# Patient Record
Sex: Male | Born: 1975 | Race: Black or African American | Hispanic: No | Marital: Married | Smoking: Never smoker
Health system: Southern US, Community
[De-identification: ages and names within clinical notes are randomized; demographics above are authoritative.]

## PROBLEM LIST (undated history)

## (undated) DIAGNOSIS — Z8674 Personal history of sudden cardiac arrest: Secondary | ICD-10-CM

## (undated) DIAGNOSIS — I219 Acute myocardial infarction, unspecified: Secondary | ICD-10-CM

## (undated) DIAGNOSIS — I10 Essential (primary) hypertension: Secondary | ICD-10-CM

## (undated) DIAGNOSIS — I2510 Atherosclerotic heart disease of native coronary artery without angina pectoris: Secondary | ICD-10-CM

---

## 2017-07-30 ENCOUNTER — Inpatient Hospital Stay (HOSPITAL_COMMUNITY)
Admission: EM | Admit: 2017-07-30 | Discharge: 2017-08-06 | DRG: 246 | Disposition: A | Discharge status: Home / Self Care | Payer: BLUE CROSS/BLUE SHIELD | Attending: Cardiology | Admitting: Cardiology

## 2017-07-30 ENCOUNTER — Encounter (HOSPITAL_COMMUNITY): Payer: Self-pay | Admitting: Emergency Medicine

## 2017-07-30 ENCOUNTER — Encounter (HOSPITAL_COMMUNITY)
Admission: EM | Disposition: A | Discharge status: Home / Self Care | Payer: Self-pay | Source: Home / Self Care | Attending: Cardiology

## 2017-07-30 DIAGNOSIS — I255 Ischemic cardiomyopathy: Secondary | ICD-10-CM | POA: Diagnosis present

## 2017-07-30 DIAGNOSIS — I252 Old myocardial infarction: Secondary | ICD-10-CM

## 2017-07-30 DIAGNOSIS — I2510 Atherosclerotic heart disease of native coronary artery without angina pectoris: Secondary | ICD-10-CM | POA: Diagnosis present

## 2017-07-30 DIAGNOSIS — I4901 Ventricular fibrillation: Secondary | ICD-10-CM | POA: Diagnosis present

## 2017-07-30 DIAGNOSIS — I5043 Acute on chronic combined systolic (congestive) and diastolic (congestive) heart failure: Secondary | ICD-10-CM | POA: Diagnosis not present

## 2017-07-30 DIAGNOSIS — Z452 Encounter for adjustment and management of vascular access device: Secondary | ICD-10-CM

## 2017-07-30 DIAGNOSIS — R402212 Coma scale, best verbal response, none, at arrival to emergency department: Secondary | ICD-10-CM | POA: Diagnosis present

## 2017-07-30 DIAGNOSIS — J690 Pneumonitis due to inhalation of food and vomit: Secondary | ICD-10-CM | POA: Diagnosis not present

## 2017-07-30 DIAGNOSIS — R739 Hyperglycemia, unspecified: Secondary | ICD-10-CM | POA: Diagnosis present

## 2017-07-30 DIAGNOSIS — R451 Restlessness and agitation: Secondary | ICD-10-CM | POA: Diagnosis not present

## 2017-07-30 DIAGNOSIS — Z7902 Long term (current) use of antithrombotics/antiplatelets: Secondary | ICD-10-CM

## 2017-07-30 DIAGNOSIS — Z79899 Other long term (current) drug therapy: Secondary | ICD-10-CM

## 2017-07-30 DIAGNOSIS — Z955 Presence of coronary angioplasty implant and graft: Secondary | ICD-10-CM | POA: Diagnosis not present

## 2017-07-30 DIAGNOSIS — Z8674 Personal history of sudden cardiac arrest: Secondary | ICD-10-CM

## 2017-07-30 DIAGNOSIS — I5021 Acute systolic (congestive) heart failure: Secondary | ICD-10-CM | POA: Diagnosis present

## 2017-07-30 DIAGNOSIS — R570 Cardiogenic shock: Secondary | ICD-10-CM

## 2017-07-30 DIAGNOSIS — R402312 Coma scale, best motor response, none, at arrival to emergency department: Secondary | ICD-10-CM | POA: Diagnosis present

## 2017-07-30 DIAGNOSIS — E785 Hyperlipidemia, unspecified: Secondary | ICD-10-CM | POA: Diagnosis present

## 2017-07-30 DIAGNOSIS — R402112 Coma scale, eyes open, never, at arrival to emergency department: Secondary | ICD-10-CM | POA: Diagnosis present

## 2017-07-30 DIAGNOSIS — I10 Essential (primary) hypertension: Secondary | ICD-10-CM

## 2017-07-30 DIAGNOSIS — Z7982 Long term (current) use of aspirin: Secondary | ICD-10-CM

## 2017-07-30 DIAGNOSIS — N170 Acute kidney failure with tubular necrosis: Secondary | ICD-10-CM | POA: Diagnosis present

## 2017-07-30 DIAGNOSIS — I110 Hypertensive heart disease with heart failure: Secondary | ICD-10-CM | POA: Diagnosis present

## 2017-07-30 DIAGNOSIS — I469 Cardiac arrest, cause unspecified: Secondary | ICD-10-CM

## 2017-07-30 DIAGNOSIS — E876 Hypokalemia: Secondary | ICD-10-CM | POA: Diagnosis not present

## 2017-07-30 DIAGNOSIS — G931 Anoxic brain damage, not elsewhere classified: Secondary | ICD-10-CM

## 2017-07-30 DIAGNOSIS — N179 Acute kidney failure, unspecified: Secondary | ICD-10-CM

## 2017-07-30 DIAGNOSIS — I2119 ST elevation (STEMI) myocardial infarction involving other coronary artery of inferior wall: Secondary | ICD-10-CM | POA: Diagnosis present

## 2017-07-30 DIAGNOSIS — I4891 Unspecified atrial fibrillation: Secondary | ICD-10-CM | POA: Diagnosis present

## 2017-07-30 DIAGNOSIS — J9601 Acute respiratory failure with hypoxia: Secondary | ICD-10-CM | POA: Diagnosis present

## 2017-07-30 DIAGNOSIS — I472 Ventricular tachycardia: Secondary | ICD-10-CM | POA: Diagnosis present

## 2017-07-30 DIAGNOSIS — R319 Hematuria, unspecified: Secondary | ICD-10-CM | POA: Diagnosis not present

## 2017-07-30 DIAGNOSIS — G9341 Metabolic encephalopathy: Secondary | ICD-10-CM | POA: Diagnosis present

## 2017-07-30 DIAGNOSIS — E872 Acidosis: Secondary | ICD-10-CM | POA: Diagnosis present

## 2017-07-30 DIAGNOSIS — J40 Bronchitis, not specified as acute or chronic: Secondary | ICD-10-CM | POA: Diagnosis not present

## 2017-07-30 DIAGNOSIS — I213 ST elevation (STEMI) myocardial infarction of unspecified site: Secondary | ICD-10-CM

## 2017-07-30 DIAGNOSIS — Z781 Physical restraint status: Secondary | ICD-10-CM

## 2017-07-30 DIAGNOSIS — E875 Hyperkalemia: Secondary | ICD-10-CM | POA: Diagnosis not present

## 2017-07-30 DIAGNOSIS — F419 Anxiety disorder, unspecified: Secondary | ICD-10-CM | POA: Diagnosis present

## 2017-07-30 DIAGNOSIS — J9690 Respiratory failure, unspecified, unspecified whether with hypoxia or hypercapnia: Secondary | ICD-10-CM

## 2017-07-30 HISTORY — PX: LEFT HEART CATH AND CORONARY ANGIOGRAPHY: CATH118249

## 2017-07-30 HISTORY — PX: CORONARY/GRAFT ACUTE MI REVASCULARIZATION: CATH118305

## 2017-07-30 HISTORY — DX: Acute myocardial infarction, unspecified: I21.9

## 2017-07-30 HISTORY — DX: Personal history of sudden cardiac arrest: Z86.74

## 2017-07-30 LAB — COMPREHENSIVE METABOLIC PANEL
ALBUMIN: 3.8 g/dL (ref 3.5–5.0)
ALK PHOS: 78 U/L (ref 38–126)
ALT: 618 U/L — ABNORMAL HIGH (ref 17–63)
ANION GAP: 23 — AB (ref 5–15)
AST: 564 U/L — ABNORMAL HIGH (ref 15–41)
BILIRUBIN TOTAL: 0.6 mg/dL (ref 0.3–1.2)
BUN: 11 mg/dL (ref 6–20)
CALCIUM: 8.3 mg/dL — AB (ref 8.9–10.3)
CO2: 11 mmol/L — ABNORMAL LOW (ref 22–32)
Chloride: 105 mmol/L (ref 101–111)
Creatinine, Ser: 1.57 mg/dL — ABNORMAL HIGH (ref 0.61–1.24)
GFR, EST NON AFRICAN AMERICAN: 53 mL/min — AB (ref >60)
GLUCOSE: 180 mg/dL — AB (ref 65–99)
POTASSIUM: 3.7 mmol/L (ref 3.5–5.1)
Sodium: 139 mmol/L (ref 135–145)
TOTAL PROTEIN: 6.8 g/dL (ref 6.5–8.1)

## 2017-07-30 LAB — I-STAT CHEM 8, ED
BUN: 13 mg/dL (ref 6–20)
CALCIUM ION: 1.03 mmol/L — AB (ref 1.15–1.40)
Chloride: 108 mmol/L (ref 101–111)
Creatinine, Ser: 1.5 mg/dL — ABNORMAL HIGH (ref 0.61–1.24)
GLUCOSE: 177 mg/dL — AB (ref 65–99)
HCT: 48 % (ref 39.0–52.0)
Hemoglobin: 16.3 g/dL (ref 13.0–17.0)
Potassium: 3.5 mmol/L (ref 3.5–5.1)
SODIUM: 140 mmol/L (ref 135–145)
TCO2: 14 mmol/L — ABNORMAL LOW (ref 22–32)

## 2017-07-30 LAB — CBC
HEMATOCRIT: 45.5 % (ref 39.0–52.0)
HEMOGLOBIN: 15.3 g/dL (ref 13.0–17.0)
MCH: 31.6 pg (ref 26.0–34.0)
MCHC: 33.6 g/dL (ref 30.0–36.0)
MCV: 94 fL (ref 78.0–100.0)
Platelets: 236 10*3/uL (ref 150–400)
RBC: 4.84 MIL/uL (ref 4.22–5.81)
RDW: 13.6 % (ref 11.5–15.5)
WBC: 11.3 10*3/uL — ABNORMAL HIGH (ref 4.0–10.5)

## 2017-07-30 LAB — HEMOGLOBIN A1C
HEMOGLOBIN A1C: 5.6 % (ref 4.8–5.6)
Mean Plasma Glucose: 114.02 mg/dL

## 2017-07-30 LAB — LIPID PANEL
Cholesterol: 133 mg/dL (ref 0–200)
HDL: 56 mg/dL (ref >40)
LDL Cholesterol: 29 mg/dL (ref 0–99)
Total CHOL/HDL Ratio: 2.4 RATIO
Triglycerides: 239 mg/dL — ABNORMAL HIGH (ref <150)
VLDL: 48 mg/dL — ABNORMAL HIGH (ref 0–40)

## 2017-07-30 LAB — PROTIME-INR
INR: 1.17
PROTHROMBIN TIME: 14.8 s (ref 11.4–15.2)

## 2017-07-30 LAB — PHOSPHORUS: PHOSPHORUS: 7.5 mg/dL — AB (ref 2.5–4.6)

## 2017-07-30 LAB — TRIGLYCERIDES: TRIGLYCERIDES: 232 mg/dL — AB (ref <150)

## 2017-07-30 LAB — I-STAT TROPONIN, ED: TROPONIN I, POC: 11.86 ng/mL — AB (ref 0.00–0.08)

## 2017-07-30 LAB — TROPONIN I: Troponin I: 11.07 ng/mL (ref <0.03)

## 2017-07-30 LAB — MAGNESIUM: MAGNESIUM: 2 mg/dL (ref 1.7–2.4)

## 2017-07-30 LAB — APTT: aPTT: 33 seconds (ref 24–36)

## 2017-07-30 SURGERY — CORONARY/GRAFT ACUTE MI REVASCULARIZATION
Anesthesia: LOCAL

## 2017-07-30 MED ORDER — CANGRELOR TETRASODIUM 50 MG IV SOLR
INTRAVENOUS | Status: AC
  Filled 2017-07-30: qty 50

## 2017-07-30 MED ORDER — CHLORHEXIDINE GLUCONATE 0.12% ORAL RINSE (MEDLINE KIT)
15.0000 mL | Freq: Two times a day (BID) | OROMUCOSAL | Status: DC
  Administered 2017-07-31 – 2017-08-03 (×8): 15 mL via OROMUCOSAL

## 2017-07-30 MED ORDER — SODIUM CHLORIDE 0.9 % IV SOLN: INTRAVENOUS | Status: DC

## 2017-07-30 MED ORDER — FENTANYL BOLUS VIA INFUSION
50.0000 ug | INTRAVENOUS | Status: DC | PRN
  Administered 2017-08-01: 50 ug via INTRAVENOUS
  Filled 2017-07-30: qty 50

## 2017-07-30 MED ORDER — LIDOCAINE HCL (PF) 1 % IJ SOLN
INTRAMUSCULAR | Status: DC | PRN
  Administered 2017-07-30: 18 mL via SUBCUTANEOUS

## 2017-07-30 MED ORDER — MIDAZOLAM HCL 2 MG/2ML IJ SOLN
INTRAMUSCULAR | Status: DC | PRN
  Administered 2017-07-30 (×2): 2 mg via INTRAVENOUS
  Administered 2017-07-30 (×2): 1 mg via INTRAVENOUS

## 2017-07-30 MED ORDER — NITROGLYCERIN 1 MG/10 ML FOR IR/CATH LAB
INTRA_ARTERIAL | Status: DC | PRN
  Administered 2017-07-30 (×2): 150 ug via INTRACORONARY

## 2017-07-30 MED ORDER — MIDAZOLAM HCL 2 MG/2ML IJ SOLN
INTRAMUSCULAR | Status: AC
  Filled 2017-07-30: qty 2

## 2017-07-30 MED ORDER — FENTANYL CITRATE (PF) 100 MCG/2ML IJ SOLN
INTRAMUSCULAR | Status: AC
  Filled 2017-07-30: qty 2

## 2017-07-30 MED ORDER — FAMOTIDINE IN NACL 20-0.9 MG/50ML-% IV SOLN
20.0000 mg | Freq: Two times a day (BID) | INTRAVENOUS | Status: DC
  Administered 2017-07-31 – 2017-08-01 (×2): 20 mg via INTRAVENOUS
  Filled 2017-07-30 (×2): qty 50

## 2017-07-30 MED ORDER — LIDOCAINE HCL (PF) 1 % IJ SOLN
INTRAMUSCULAR | Status: AC
  Filled 2017-07-30: qty 30

## 2017-07-30 MED ORDER — PROPOFOL 1000 MG/100ML IV EMUL
5.0000 ug/kg/min | INTRAVENOUS | Status: DC
  Filled 2017-07-30: qty 100

## 2017-07-30 MED ORDER — EPINEPHRINE PF 1 MG/ML IJ SOLN
0.5000 ug/min | INTRAMUSCULAR | Status: DC
  Filled 2017-07-30: qty 4

## 2017-07-30 MED ORDER — CISATRACURIUM BOLUS VIA INFUSION
0.0500 mg/kg | INTRAVENOUS | Status: DC | PRN
  Filled 2017-07-30: qty 6

## 2017-07-30 MED ORDER — ETOMIDATE 2 MG/ML IV SOLN
INTRAVENOUS | Status: AC | PRN
  Administered 2017-07-30: 10 mg via INTRAVENOUS

## 2017-07-30 MED ORDER — MIDAZOLAM HCL 2 MG/2ML IJ SOLN
2.0000 mg | Freq: Once | INTRAMUSCULAR | Status: AC
  Administered 2017-07-31: 2 mg via INTRAVENOUS

## 2017-07-30 MED ORDER — BIVALIRUDIN BOLUS VIA INFUSION - CUPID
INTRAVENOUS | Status: DC | PRN
  Administered 2017-07-30: 76.575 mg via INTRAVENOUS

## 2017-07-30 MED ORDER — CANGRELOR BOLUS VIA INFUSION
INTRAVENOUS | Status: DC | PRN
  Administered 2017-07-30: 3063 ug via INTRAVENOUS

## 2017-07-30 MED ORDER — BIVALIRUDIN TRIFLUOROACETATE 250 MG IV SOLR
INTRAVENOUS | Status: AC
  Filled 2017-07-30: qty 250

## 2017-07-30 MED ORDER — SODIUM CHLORIDE 0.9 % IV SOLN
INTRAVENOUS | Status: DC | PRN
  Administered 2017-07-30 (×2): 4 ug/kg/min via INTRAVENOUS

## 2017-07-30 MED ORDER — SODIUM BICARBONATE 8.4 % IV SOLN
INTRAVENOUS | Status: DC | PRN
  Administered 2017-07-30 (×2): 50 meq via INTRAVENOUS

## 2017-07-30 MED ORDER — SODIUM BICARBONATE 8.4 % IV SOLN
INTRAVENOUS | Status: AC
  Administered 2017-07-31: 100 meq via INTRAVENOUS
  Filled 2017-07-30: qty 100

## 2017-07-30 MED ORDER — NITROGLYCERIN 1 MG/10 ML FOR IR/CATH LAB
INTRA_ARTERIAL | Status: AC
  Filled 2017-07-30: qty 10

## 2017-07-30 MED ORDER — SODIUM CHLORIDE 0.9 % IV SOLN
1.0000 ug/kg/min | INTRAVENOUS | Status: DC
  Administered 2017-07-31: 1 ug/kg/min via INTRAVENOUS
  Administered 2017-07-31: 1.5 ug/kg/min via INTRAVENOUS
  Filled 2017-07-30 (×2): qty 20

## 2017-07-30 MED ORDER — SENNOSIDES 8.8 MG/5ML PO SYRP
5.0000 mL | ORAL_SOLUTION | Freq: Every day | ORAL | Status: DC
  Administered 2017-07-31 – 2017-08-01 (×2): 5 mL
  Filled 2017-07-30 (×2): qty 5

## 2017-07-30 MED ORDER — DEXTROSE 5 % IV SOLN
INTRAVENOUS | Status: AC | PRN
  Administered 2017-07-30: 150 mg via INTRAVENOUS

## 2017-07-30 MED ORDER — DOCUSATE SODIUM 50 MG/5ML PO LIQD
100.0000 mg | Freq: Two times a day (BID) | ORAL | Status: DC
  Administered 2017-07-31 – 2017-08-01 (×4): 100 mg
  Filled 2017-07-30 (×4): qty 10

## 2017-07-30 MED ORDER — INSULIN ASPART 100 UNIT/ML ~~LOC~~ SOLN
1.0000 [IU] | SUBCUTANEOUS | Status: DC
  Administered 2017-07-31: 3 [IU] via SUBCUTANEOUS
  Administered 2017-07-31 (×2): 2 [IU] via SUBCUTANEOUS

## 2017-07-30 MED ORDER — AMIODARONE HCL IN DEXTROSE 360-4.14 MG/200ML-% IV SOLN
INTRAVENOUS | Status: AC | PRN
  Administered 2017-07-30 (×2): 60 mg/h via INTRAVENOUS

## 2017-07-30 MED ORDER — SODIUM BICARBONATE 8.4 % IV SOLN
100.0000 meq | Freq: Once | INTRAVENOUS | Status: AC
  Administered 2017-07-31: 100 meq via INTRAVENOUS

## 2017-07-30 MED ORDER — TICAGRELOR 90 MG PO TABS
ORAL_TABLET | ORAL | Status: DC | PRN
  Administered 2017-07-30: 180 mg via ORAL

## 2017-07-30 MED ORDER — HEPARIN (PORCINE) IN NACL 2-0.9 UNITS/ML
INTRAMUSCULAR | Status: AC | PRN
  Administered 2017-07-30 (×3): 500 mL

## 2017-07-30 MED ORDER — NOREPINEPHRINE 4 MG/250ML-% IV SOLN
INTRAVENOUS | Status: AC
Start: 2017-07-30 — End: 2017-07-30
  Filled 2017-07-30: qty 250

## 2017-07-30 MED ORDER — FENTANYL CITRATE (PF) 100 MCG/2ML IJ SOLN
INTRAMUSCULAR | Status: DC | PRN
  Administered 2017-07-30: 50 ug via INTRAVENOUS

## 2017-07-30 MED ORDER — ARTIFICIAL TEARS OPHTHALMIC OINT
1.0000 "application " | TOPICAL_OINTMENT | Freq: Three times a day (TID) | OPHTHALMIC | Status: DC
  Administered 2017-07-31 – 2017-08-01 (×5): 1 via OPHTHALMIC
  Filled 2017-07-30: qty 3.5

## 2017-07-30 MED ORDER — IOHEXOL 350 MG/ML SOLN
INTRAVENOUS | Status: DC | PRN
  Administered 2017-07-30: 80 mL via INTRAVENOUS

## 2017-07-30 MED ORDER — ROCURONIUM BROMIDE 50 MG/5ML IV SOLN
INTRAVENOUS | Status: AC | PRN
  Administered 2017-07-30: 90 mg via INTRAVENOUS

## 2017-07-30 MED ORDER — SODIUM CHLORIDE 0.9 % IV SOLN
INTRAVENOUS | Status: AC | PRN
  Administered 2017-07-30: 102 mL via INTRAVENOUS

## 2017-07-30 MED ORDER — ORAL CARE MOUTH RINSE
15.0000 mL | Freq: Four times a day (QID) | OROMUCOSAL | Status: DC
  Administered 2017-07-31 (×2): 15 mL via OROMUCOSAL

## 2017-07-30 MED ORDER — VERAPAMIL HCL 2.5 MG/ML IV SOLN
INTRAVENOUS | Status: DC | PRN
  Administered 2017-07-30: 400 ug via INTRACORONARY
  Administered 2017-07-30: 200 ug via INTRACORONARY

## 2017-07-30 MED ORDER — FENTANYL 2500MCG IN NS 250ML (10MCG/ML) PREMIX INFUSION
100.0000 ug/h | INTRAVENOUS | Status: DC
  Administered 2017-07-31: 250 ug/h via INTRAVENOUS
  Administered 2017-07-31: 100 ug/h via INTRAVENOUS
  Administered 2017-07-31: 200 ug/h via INTRAVENOUS
  Administered 2017-08-01 (×2): 300 ug/h via INTRAVENOUS
  Filled 2017-07-30 (×5): qty 250

## 2017-07-30 MED ORDER — SODIUM CHLORIDE 0.9 % IV SOLN
INTRAVENOUS | Status: AC | PRN
  Administered 2017-07-30: 1000 mL via INTRAVENOUS

## 2017-07-30 MED ORDER — POTASSIUM CHLORIDE 10 MEQ/100ML IV SOLN
10.0000 meq | INTRAVENOUS | Status: AC
  Administered 2017-07-31 (×4): 10 meq via INTRAVENOUS
  Filled 2017-07-30 (×4): qty 100

## 2017-07-30 MED ORDER — ASPIRIN 300 MG RE SUPP
300.0000 mg | RECTAL | Status: AC
  Administered 2017-07-30: 300 mg via RECTAL
  Filled 2017-07-30: qty 1

## 2017-07-30 MED ORDER — SODIUM CHLORIDE 0.9 % IV SOLN
2.0000 mg/h | INTRAVENOUS | Status: DC
  Administered 2017-07-31 (×2): 2 mg/h via INTRAVENOUS
  Administered 2017-07-31 – 2017-08-01 (×2): 6 mg/h via INTRAVENOUS
  Filled 2017-07-30 (×5): qty 10

## 2017-07-30 MED ORDER — NOREPINEPHRINE BITARTRATE 1 MG/ML IV SOLN
0.0000 ug/min | INTRAVENOUS | Status: DC
  Administered 2017-07-30: 20 ug/min via INTRAVENOUS
  Administered 2017-08-01: 10 ug/min via INTRAVENOUS
  Administered 2017-08-02: 40 ug/min via INTRAVENOUS
  Filled 2017-07-30 (×6): qty 16

## 2017-07-30 MED ORDER — NOREPINEPHRINE BITARTRATE 1 MG/ML IV SOLN
INTRAVENOUS | Status: DC | PRN
  Administered 2017-07-30: 10 ug/min via INTRAVENOUS
  Administered 2017-07-30

## 2017-07-30 MED ORDER — IOPAMIDOL (ISOVUE-370) INJECTION 76%
INTRAVENOUS | Status: DC | PRN
  Administered 2017-07-30: 115 mL via INTRAVENOUS

## 2017-07-30 MED ORDER — CISATRACURIUM BOLUS VIA INFUSION
0.1000 mg/kg | Freq: Once | INTRAVENOUS | Status: AC
  Administered 2017-07-31: 10.2 mg via INTRAVENOUS
  Filled 2017-07-30: qty 11

## 2017-07-30 MED ORDER — MIDAZOLAM BOLUS VIA INFUSION
2.0000 mg | INTRAVENOUS | Status: DC | PRN
  Filled 2017-07-30: qty 2

## 2017-07-30 MED ORDER — ALBUTEROL SULFATE (2.5 MG/3ML) 0.083% IN NEBU: 2.5000 mg | INHALATION_SOLUTION | RESPIRATORY_TRACT | Status: DC | PRN

## 2017-07-30 MED ORDER — FENTANYL CITRATE (PF) 100 MCG/2ML IJ SOLN
100.0000 ug | Freq: Once | INTRAMUSCULAR | Status: AC
  Administered 2017-07-31: 100 ug via INTRAVENOUS

## 2017-07-30 MED ORDER — HEPARIN (PORCINE) IN NACL 1000-0.9 UT/500ML-% IV SOLN
INTRAVENOUS | Status: AC
  Filled 2017-07-30: qty 1500

## 2017-07-30 MED ORDER — SODIUM CHLORIDE 0.9 % IV SOLN
INTRAVENOUS | Status: DC | PRN
  Administered 2017-07-30 (×3): 1.75 mg/kg/h via INTRAVENOUS

## 2017-07-30 SURGICAL SUPPLY — 30 items
BALLN EMERGE MR 2.5X12 (BALLOONS) ×2
BALLN EMERGE MR 3.5X20 (BALLOONS) ×2
BALLN EMERGE MR PUSH 1.5X15 (BALLOONS) ×2
BALLN ~~LOC~~ EMERGE MR 2.5X12 (BALLOONS) ×2
BALLN ~~LOC~~ EMERGE MR 2.5X20 (BALLOONS) ×2
BALLN ~~LOC~~ EMERGE MR 3.0X20 (BALLOONS) ×2
BALLN ~~LOC~~ EMERGE MR 4.0X20 (BALLOONS) ×2
BALLN ~~LOC~~ EMERGE MR 4.5X12 (BALLOONS) ×2
BALLOON EMERGE MR 2.5X12 (BALLOONS) ×1 IMPLANT
BALLOON EMERGE MR 3.5X20 (BALLOONS) ×1 IMPLANT
BALLOON EMERGE MR PUSH 1.5X15 (BALLOONS) ×1 IMPLANT
BALLOON ~~LOC~~ EMERGE MR 2.5X12 (BALLOONS) ×1 IMPLANT
BALLOON ~~LOC~~ EMERGE MR 2.5X20 (BALLOONS) ×1 IMPLANT
BALLOON ~~LOC~~ EMERGE MR 3.0X20 (BALLOONS) ×1 IMPLANT
BALLOON ~~LOC~~ EMERGE MR 4.0X20 (BALLOONS) ×1 IMPLANT
BALLOON ~~LOC~~ EMERGE MR 4.5X12 (BALLOONS) ×1 IMPLANT
CATH EXTRAC PRONTO 5.5F 138CM (CATHETERS) ×2 IMPLANT
CATH INFINITI 5FR MULTPACK ANG (CATHETERS) ×2 IMPLANT
CATH LAUNCHER 6FR JR4 (CATHETERS) ×2 IMPLANT
KIT ENCORE 26 ADVANTAGE (KITS) ×2 IMPLANT
KIT HEART LEFT (KITS) ×2 IMPLANT
PACK CARDIAC CATHETERIZATION (CUSTOM PROCEDURE TRAY) ×2 IMPLANT
SHEATH PINNACLE 6F 10CM (SHEATH) ×2 IMPLANT
STENT SIERRA 3.50 X 38 MM (Permanent Stent) ×2 IMPLANT
STENT SIERRA 4.00 X 28 MM (Permanent Stent) ×2 IMPLANT
TRANSDUCER W/STOPCOCK (MISCELLANEOUS) ×2 IMPLANT
TUBING CIL FLEX 10 FLL-RA (TUBING) ×2 IMPLANT
WIRE EMERALD 3MM-J .035X150CM (WIRE) ×2 IMPLANT
WIRE PT2 LS 185 (WIRE) ×4 IMPLANT
WIRE RUNTHROUGH .014X180CM (WIRE) ×2 IMPLANT

## 2017-07-30 NOTE — ED Provider Notes (Signed)
[place] EMERGENCY DEPARTMENT Provider Note   CSN: [id number] Arrival date & time: 07/30/17  1928     History   Chief Complaint Chief Complaint  Patient presents with  . Code STEMI    HPI Lee Moore is a 42 y.o. male.  HPI  42 year old male with a history of coronary artery disease, with history of 5 stent placement, presented with concern for V. fib arrest.  Patient had witnessed arrest with bystander CPR, and on fire arrival AED had recommended shock, and patient was shocked twice with the fire department, and on EMS arrival, was still in ventricular fibrillation, received 2 further defibrillations with return of spontaneous circulation.  His blood pressures had been 120s, and 90s just prior to arrival.  His estimated downtime was approximately 15 minutes.  Patient had return of spontaneous circulation prior to receiving other medications or EMS.  On arrival to the emergency department, patient is with sinus tachycardia, with BVM assisting ventilations.  EMS had previously placed a King airway, but patient had been biting down on the tube, and the tube was removed.  He has not opened eyes, made sound, or had any movements to pain.   Past Medical History:  Diagnosis Date  . MI (myocardial infarction) [facility])     Patient Active Problem List   Diagnosis Date Noted  . Cardiac arrest ([place]) 07/30/2017    History reviewed. No pertinent surgical history.      Home Medications    Prior to Admission medications   Not on File    Family History No family history on file.  Social History Social History   Tobacco Use  . Smoking status: Unknown If Ever Smoked  Substance Use Topics  . Alcohol use: Not Currently    Comment: unknown  . Drug use: Not on file     Allergies   Patient has no allergy information on record.   Review of Systems Review of Systems  Unable to perform ROS: Patient unresponsive     Physical Exam Updated Vital Signs BP  (!) 79/41   Pulse 75   Resp (!) 23   Ht 6' (1.829 m)   Wt 102.1 kg (225 lb)   SpO2 100%   BMI 30.52 kg/m   Physical Exam  Constitutional: He appears well-developed and well-nourished. He has a sickly appearance. He appears ill.  HENT:  Head: Normocephalic and atraumatic.  Eyes: Pupils are equal, round, and reactive to light. Conjunctivae are normal.  Neck: Normal range of motion.  Cardiovascular: Regular rhythm, normal heart sounds and intact distal pulses. Tachycardia present. Exam reveals no gallop and no friction rub.  No murmur heard. Pulmonary/Chest: No respiratory distress. He has no wheezes.  Spontaneous breaths assisted by BVM Rhonchi bilaterally  Abdominal: Soft. He exhibits no distension. There is no tenderness. There is no guarding.  Musculoskeletal: He exhibits no edema.  Neurological: He is unresponsive. GCS eye subscore is 1. GCS verbal subscore is 1. GCS motor subscore is 1.  Skin: Skin is warm and dry. He is not diaphoretic.  Nursing note and vitals reviewed.    ED Treatments / Results  Labs (all labs ordered are listed, but only abnormal results are displayed) Labs Reviewed  COMPREHENSIVE METABOLIC PANEL - Abnormal; Notable for the following components:      Result Value   CO2 11 (*)    Glucose, Bld 180 (*)    Creatinine, Ser 1.57 (*)    Calcium 8.3 (*)    AST  564 (*)    ALT 618 (*)    GFR calc non Af Amer 53 (*)    Anion gap 23 (*)    All other components within normal limits  LIPID PANEL - Abnormal; Notable for the following components:   Triglycerides 239 (*)    VLDL 48 (*)    All other components within normal limits  CBC - Abnormal; Notable for the following components:   WBC 11.3 (*)    All other components within normal limits  TROPONIN I - Abnormal; Notable for the following components:   Troponin I 11.07 (*)    All other components within normal limits  PHOSPHORUS - Abnormal; Notable for the following components:   Phosphorus 7.5 (*)     All other components within normal limits  PROTIME-INR - Abnormal; Notable for the following components:   Prothrombin Time 24.5 (*)    All other components within normal limits  APTT - Abnormal; Notable for the following components:   aPTT 69 (*)    All other components within normal limits  LACTIC ACID, PLASMA - Abnormal; Notable for the following components:   Lactic Acid, Venous 3.5 (*)    All other components within normal limits  TRIGLYCERIDES - Abnormal; Notable for the following components:   Triglycerides 232 (*)    All other components within normal limits  GLUCOSE, CAPILLARY - Abnormal; Notable for the following components:   Glucose-Capillary 224 (*)    All other components within normal limits  I-STAT CHEM 8, ED - Abnormal; Notable for the following components:   Creatinine, Ser 1.50 (*)    Glucose, Bld 177 (*)    Calcium, Ion 1.03 (*)    TCO2 14 (*)    All other components within normal limits  I-STAT TROPONIN, ED - Abnormal; Notable for the following components:   Troponin i, poc 11.86 (*)    All other components within normal limits  MRSA PCR SCREENING  CULTURE, RESPIRATORY (NON-EXPECTORATED)  HEMOGLOBIN A1C  PROTIME-INR  APTT  MAGNESIUM  CBC  BLOOD GAS, ARTERIAL  HIV ANTIBODY (ROUTINE TESTING)  TROPONIN I  TROPONIN I  TROPONIN I  BASIC METABOLIC PANEL  BASIC METABOLIC PANEL  BASIC METABOLIC PANEL  BASIC METABOLIC PANEL  BASIC METABOLIC PANEL  BASIC METABOLIC PANEL  BASIC METABOLIC PANEL  PROTIME-INR  APTT  BLOOD GAS, ARTERIAL  BLOOD GAS, ARTERIAL  BLOOD GAS, ARTERIAL  MAGNESIUM  PHOSPHORUS  PROCALCITONIN  HEPATIC FUNCTION PANEL  TROPONIN I  BASIC METABOLIC PANEL  BASIC METABOLIC PANEL  BASIC METABOLIC PANEL  BASIC METABOLIC PANEL  BASIC METABOLIC PANEL  BASIC METABOLIC PANEL  URINALYSIS, ROUTINE W REFLEX MICROSCOPIC  CORTISOL    EKG EKG Interpretation  Date/Time:  Saturday Jul 30 2017 19:32:44 EDT Ventricular Rate:  140 PR  Interval:    QRS Duration: 111 QT Interval:  307 QTC Calculation: 469 R Axis:   -90 Text Interpretation:  Sinus tachycardia with irregular rate LVH with secondary repolarization abnormality Inferior infarct, acute (RCA) Anterior infarct, old Probable RV involvement, suggest recording right precordial leads No previous ECGs available Confirmed by Gareth Morgan [phone]) on 07/31/2017 1:28:45 AM   Radiology Dg Chest Port 1 View  Result Date: 07/31/2017 CLINICAL DATA:  42 y/o  M; endotracheal tube, central line, OG tube. EXAM: PORTABLE CHEST 1 VIEW COMPARISON:  None. FINDINGS: Endotracheal tube tip 2 cm above the carina. Enteric tube tip below field of view and abdomen. Right central venous catheter tip projects over right cavoatrial junction. Transcutaneous  pacing pads noted. Hazy opacities in the lungs. No pleural effusion or pneumothorax. Bones are unremarkable. IMPRESSION: Hazy opacities of the lungs probably represents vascular congestion. Endotracheal tube, enteric tube, and right central venous catheter in satisfactory position. Electronically Signed   By: Kristine Garbe M.D.   On: 07/31/2017 00:33    Procedures .Critical Care Performed by: Gareth Morgan, MD Authorized by: Gareth Morgan, MD   Critical care provider statement:    Critical care time (minutes):  30   Critical care was necessary to treat or prevent imminent or life-threatening deterioration of the following conditions:  Cardiac failure   Critical care was time spent personally by me on the following activities:  Discussions with consultants, re-evaluation of patient's condition, ordering and review of laboratory studies, pulse oximetry and ordering and performing treatments and interventions Procedure Name: Intubation Date/Time: 07/31/2017 1:40 AM Performed by: Gareth Morgan, MD Pre-anesthesia Checklist: Patient identified, Patient being monitored, Emergency Drugs available, Timeout performed and Suction  available Oxygen Delivery Method: Ambu bag Preoxygenation: Pre-oxygenation with 100% oxygen Induction Type: Rapid sequence Ventilation: Mask ventilation without difficulty Laryngoscope Size: Glidescope Grade View: Grade I Tube size: 7.0 mm Number of attempts: 1 Placement Confirmation: ETT inserted through vocal cords under direct vision,  CO2 detector and Breath sounds checked- equal and bilateral      (including critical care time)  Medications Ordered in ED Medications  propofol (DIPRIVAN) 1000 MG/100ML infusion (20 mcg/kg/min  102.1 kg Intravenous Rate/Dose Change 07/30/17 2245)  norepinephrine (LEVOPHED) 16 mg in dextrose 5 % 250 mL (0.064 mg/mL) infusion (40 mcg/min Intravenous Rate/Dose Change 07/30/17 2340)  cisatracurium (NIMBEX) bolus via infusion 10.2 mg (10.2 mg Intravenous Bolus from Bag 07/31/17 0014)    And  cisatracurium (NIMBEX) 200 mg in sodium chloride 0.9 % 200 mL (1 mg/mL) infusion (1 mcg/kg/min  102.1 kg Intravenous New Bag/Given 07/31/17 0014)    And  cisatracurium (NIMBEX) bolus via infusion 5.1 mg (has no administration in time range)  artificial tears (LACRILUBE) ophthalmic ointment 1 application (has no administration in time range)  0.9 %  sodium chloride infusion (has no administration in time range)  fentaNYL 252mg in NS 254m(1059mml) infusion-PREMIX (200 mcg/hr Intravenous New Bag/Given 07/31/17 0014)  fentaNYL (SUBLIMAZE) bolus via infusion 50 mcg (has no administration in time range)  midazolam (VERSED) 50 mg in sodium chloride 0.9 % 50 mL (1 mg/mL) infusion (2 mg/hr Intravenous New Bag/Given 07/31/17 0014)  midazolam (VERSED) bolus via infusion 2 mg (has no administration in time range)  famotidine (PEPCID) IVPB 20 mg premix (has no administration in time range)  chlorhexidine gluconate (MEDLINE KIT) (PERIDEX) 0.12 % solution 15 mL (15 mLs Mouth Rinse Given 07/31/17 0111)  MEDLINE mouth rinse (has no administration in time range)  albuterol (PROVENTIL) (2.5  MG/3ML) 0.083% nebulizer solution 2.5 mg (has no administration in time range)  insulin aspart (novoLOG) injection 1-3 Units (has no administration in time range)  docusate (COLACE) 50 MG/5ML liquid 100 mg (has no administration in time range)  sennosides (SENOKOT) 8.8 MG/5ML syrup 5 mL (has no administration in time range)  potassium chloride 10 mEq in 100 mL IVPB (10 mEq Intravenous New Bag/Given 07/31/17 0045)  EPINEPHrine (ADRENALIN) 4 mg in dextrose 5 % 250 mL (0.016 mg/mL) infusion (has no administration in time range)  0.9 %  sodium chloride infusion (1,000 mLs Intravenous New Bag/Given 07/30/17 1934)  etomidate (AMIDATE) injection (10 mg Intravenous Given 07/30/17 1935)  rocuronium (ZEMURON) injection (90 mg Intravenous Given 07/30/17 1935)  amiodarone (CORDARONE) 150 mg in dextrose 5 % 100 mL bolus (150 mg Intravenous New Bag/Given 07/30/17 1940)  amiodarone (NEXTERONE PREMIX) 360-4.14 MG/200ML-% (1.8 mg/mL) IV infusion (60 mg/hr Intravenous New Bag/Given 07/30/17 2223)  0.9 %  sodium chloride infusion (  Rate/Dose Change 07/30/17 2250)  heparin infusion 2 units/mL in 0.9 % sodium chloride (500 mLs Other New Bag/Given 07/30/17 2258)  aspirin suppository 300 mg (300 mg Rectal Given 07/30/17 2338)  fentaNYL (SUBLIMAZE) injection 100 mcg (100 mcg Intravenous Given 07/31/17 0014)  midazolam (VERSED) injection 2 mg (2 mg Intravenous Given 07/31/17 0014)  sodium bicarbonate injection 100 mEq (100 mEq Intravenous Given 07/31/17 0003)     Initial Impression / Assessment and Plan / ED Course  I have reviewed the triage vital signs and the nursing notes.  Pertinent labs & imaging results that were available during my care of the patient were reviewed by me and considered in my medical decision making (see chart for details).     42 year old male with a history of coronary artery disease, with history of 5 stent placement, presented with concern for V. fib arrest.  Patient had witnessed arrest with bystander CPR,  and on fire arrival AED had recommended shock, and patient was shocked twice with the fire department, and on EMS arrival, was still in ventricular fibrillation, received 2 further defibrillations with return of spontaneous circulation.  King airway was both placed and removed by EMS when patient began to bite on tube.  EMS called Code STEMI.   On arrival to the ED, Cardiology at bedside. Immediately placed patient on monitor and intubated for airway protection. GCS 3.  Airway confirmed with bilateral breath sounds, end tidal CO2 and direct visualization.  Do not feel XR given presentation at this time and critical condition is indicated as patient developed cardiac arrhythmias again and feel resuscitation and immediate reperfusion most important. As we completed intubation and were preparing for the cath lab, he went into pulseless VT.  He was shocked and compressions initiated.  He then went between pulseless VT, receiving amiodarone 338m, epi 143m and approximately 4 defibrillations with moments of VT with pulse and beats of sinus prior to return of VT. Patient with ROSC, sinus rhythm, and he was brought to the cath lab in critical condition with Cardiology. Updated family regarding his critical condition. Patient also received heparin, cooling initiated with ice packs.   Final Clinical Impressions(s) / ED Diagnoses   Final diagnoses:  Cardiac arrest with ventricular fibrillation ([place] elevation myocardial infarction (STEMI), [facility]   ED Discharge Orders        Ordered    AMB Referral to Cardiac Rehabilitation - Phase II    Comments:  V. fib cardiac arrest   07/30/17 2328       ScGareth MorganMD 07/31/17 [id number]

## 2017-07-30 NOTE — ED Notes (Signed)
To Cath lab 

## 2017-07-30 NOTE — ED Triage Notes (Addendum)
Brought by ems after having a witnessed arrest.  Shocked X 4.  Versed 2.5 given via ems.  King airway in place on arrival.  Found in v fib then showed STEMI.  Medications documented under one step except heparin 4000 units given IV at 1934.  Cooling started at 1933.  King airway removed at 1935 reintubated at this time.  In V tach at 1937 rate between 150-210.  Shocked at 200 j at 1930, 1939, had positive pulses but continued to be tachy at 160-210.  Shocked again at 200j at 28.  Given Epi 1 at 1941 and shocked again 200j.  Positive Right carotid pulse continues.  Shocked again 1943 still positive pulses with monitor showing vtach 237.  Shocked again 1945 on the way to cath lab.  Continued to have pulses and tachy at 180.  OG tube placed on arrival to cath lab. 16 fr.  Positive placement with auscultation.

## 2017-07-30 NOTE — H&P (Signed)
Lee Moore is an 42 y.o. male.   Chief Complaint: Code STEMI  HPI:  patient is 42 year old male with past medical history significant for coronary artery disease history of inferior wall myocardial infarction in 2017, hypertension, hyperlipidemia, had witnessed cardiac arrest at his friend's house after his son's baseball game. Patient suddenly collapsed EMS was called and was noted to be in V. fib requiring multiple defibrillations with conversion to sinus rhythm patient was intubated on the field and was brought to the ED return of spontaneous circulation  achieved in an and he approximately 10-15 minutes as per his wife. patient subsequently had multiple episodes of sustained VT in the ER requiring multiple defibrillations with conversion to sinus rhythm patient was emergently brought to the Cath Lab for emergency PCI EKG done on the field showed A. fib with RVR with Q waves in inferior leads with ST elevation and reciprocal ST depression in high lateral leads. As per his wife patient had vague chest discomfort a week ago did not seek any medical attention also ran out of aspirin a week ago has been on chronic Brilinta and aspirin since his MI. Patient recently moved from [facility] had 5 stents in distal RCA and PDA and PLV branches as per his wife.   Past Medical History:  Diagnosis Date  . MI (myocardial infarction) ([place])     History reviewed. No pertinent surgical history.  No family history on file. Social History:  reports that he drank alcohol. His tobacco and drug histories are not on file.  Allergies: Not on File   (Not in a hospital admission)  Results for orders placed or performed during the hospital encounter of 07/30/17 (from the past 48 hour(s))  Comprehensive metabolic panel     Status: Abnormal   Collection Time: 07/30/17  7:35 PM  Result Value Ref Range   Sodium 139 135 - 145 mmol/L   Potassium 3.7 3.5 - 5.1 mmol/L   Chloride 105 101 - 111 mmol/L   CO2 11 (L) 22 - 32  mmol/L   Glucose, Bld 180 (H) 65 - 99 mg/dL   BUN 11 6 - 20 mg/dL   Creatinine, Ser 1.57 (H) 0.61 - 1.24 mg/dL   Calcium 8.3 (L) 8.9 - 10.3 mg/dL   Total Protein 6.8 6.5 - 8.1 g/dL   Albumin 3.8 3.5 - 5.0 g/dL   AST 564 (H) 15 - 41 U/L   ALT 618 (H) 17 - 63 U/L   Alkaline Phosphatase 78 38 - 126 U/L   Total Bilirubin 0.6 0.3 - 1.2 mg/dL   GFR calc non Af Amer 53 (L) >60 mL/min   GFR calc Af Amer >60 >60 mL/min    Comment: (NOTE) The eGFR has been calculated using the CKD EPI equation. This calculation has not been validated in all clinical situations. eGFR's persistently <60 mL/min signify possible Chronic Kidney Disease.    Anion gap 23 (H) 5 - 15    Comment: Performed at [facility], [place] [address], [place] [address]  CBC     Status: Abnormal   Collection Time: 07/30/17  7:35 PM  Result Value Ref Range   WBC 11.3 (H) 4.0 - 10.5 K/uL   RBC 4.84 4.22 - 5.81 MIL/uL   Hemoglobin 15.3 13.0 - 17.0 g/dL   HCT 45.5 39.0 - 52.0 %   MCV 94.0 78.0 - 100.0 fL   MCH 31.6 26.0 - 34.0 pg   MCHC 33.6 30.0 - 36.0 g/dL  RDW 13.6 11.5 - 15.5 %   Platelets 236 150 - 400 K/uL    Comment: Performed at [facility], [place] [address], [place], [place] [address]  Protime-INR     Status: None   Collection Time: 07/30/17  7:35 PM  Result Value Ref Range   Prothrombin Time 14.8 11.4 - 15.2 seconds   INR 1.17     Comment: Performed at [facility] [address], [place], [place] [address]  APTT     Status: None   Collection Time: 07/30/17  7:35 PM  Result Value Ref Range   aPTT 33 24 - 36 seconds    Comment: Performed at [facility] [address], [place], [place] [address]  Troponin I     Status: Abnormal   Collection Time: 07/30/17  7:35 PM  Result Value Ref Range   Troponin I 11.07 (HH) <0.03 ng/mL    Comment: CRITICAL RESULT CALLED TO, READ BACK BY AND VERIFIED WITH: GARRETT,J RN @ 2101 07/30/17 LEONARD,A Performed at [facility], [place]  [address] [place], [place] [address]   I-stat troponin, ED     Status: Abnormal   Collection Time: 07/30/17  7:38 PM  Result Value Ref Range   Troponin i, poc 11.86 (HH) 0.00 - 0.08 ng/mL   Comment NOTIFIED PHYSICIAN    Comment 3            Comment: Due to the release kinetics of cTnI, a negative result within the first hours of the onset of symptoms does not rule out myocardial infarction with certainty. If myocardial infarction is still suspected, repeat the test at appropriate intervals.   I-stat chem 8, ed     Status: Abnormal   Collection Time: 07/30/17  7:39 PM  Result Value Ref Range   Sodium 140 135 - 145 mmol/L   Potassium 3.5 3.5 - 5.1 mmol/L   Chloride 108 101 - 111 mmol/L   BUN 13 6 - 20 mg/dL   Creatinine, Ser 1.50 (H) 0.61 - 1.24 mg/dL   Glucose, Bld 177 (H) 65 - 99 mg/dL   Calcium, Ion 1.03 (L) 1.15 - 1.40 mmol/L   TCO2 14 (L) 22 - 32 mmol/L   Hemoglobin 16.3 13.0 - 17.0 g/dL   HCT 48.0 39.0 - 52.0 %  Lipid panel     Status: Abnormal   Collection Time: 07/30/17  8:05 PM  Result Value Ref Range   Cholesterol 133 0 - 200 mg/dL   Triglycerides 239 (H) <150 mg/dL   HDL 56 >40 mg/dL   Total CHOL/HDL Ratio 2.4 RATIO   VLDL 48 (H) 0 - 40 mg/dL   LDL Cholesterol 29 0 - 99 mg/dL    Comment:        Total Cholesterol/HDL:CHD Risk Coronary Heart Disease Risk Table                     Men   Women  1/2 Average Risk   3.4   3.3  Average Risk       5.0   4.4  2 X Average Risk   9.6   7.1  3 X Average Risk  23.4   11.0        Use the calculated Patient Ratio above and the CHD Risk Table to determine the patient's CHD Risk.        ATP III CLASSIFICATION (LDL):  <100     mg/dL  Optimal  100-129  mg/dL   Near or Above                    Optimal  130-159  mg/dL   Borderline  160-189  mg/dL   High  >190     mg/dL   Very High Performed at [facility] [address] [place], [place] [address]   Hemoglobin A1c     Status: None   Collection Time: 07/30/17  8:05 PM   Result Value Ref Range   Hgb A1c MFr Bld 5.6 4.8 - 5.6 %    Comment: (NOTE) Pre diabetes:          5.7%-6.4% Diabetes:              >6.4% Glycemic control for   <7.0% adults with diabetes    Mean Plasma Glucose 114.02 mg/dL    Comment: Performed at [facility] [address] [place], [place] [address]   No results found.  Review of Systems  Unable to perform ROS: Intubated    Blood pressure (!) 99/57, pulse 83, resp. rate 20, height 6' (1.829 m), weight 102.1 kg (225 lb), SpO2 (!) 76 %. Physical Exam  Eyes: Pupils are equal, round, and reactive to light. Conjunctivae are normal. Left eye exhibits no discharge. No scleral icterus.  Neck: Normal range of motion. Neck supple.  Cardiovascular: Normal rate and regular rhythm.  Respiratory:  Clear anteriorly bilateral rhonchi laterally noted  GI: Soft. Bowel sounds are normal. He exhibits no distension. There is no tenderness. There is no rebound.  Musculoskeletal: He exhibits no edema, tenderness or deformity.  Neurological:  Intubated sedated and unresponsive     Assessment/Plan Out of hospital witnessed V. fib cardiac arrest Acute inferior wall myocardial infarction History of coronary artery disease history of inferior wall MI in the past requiring multiple stents in [place]. Hypertension Hyperlipidemia Plan Discussed with his wife briefly regarding emergency left cath possible PTCA stenting its risk and benefits i.e. death MI stroke etc. and consents for PCI  Charolette Forward, MD 07/30/2017, 10:55 PM

## 2017-07-30 NOTE — H&P (Signed)
PULMONARY / CRITICAL CARE MEDICINE   Name: Lee Moore MRN: [id number] DOB: [birth year]    ADMISSION DATE:  07/30/2017 CONSULTATION DATE:  07/30/2017  REFERRING MD:    CHIEF COMPLAINT:  Cardiac arrest  HISTORY OF PRESENT ILLNESS:   HPI obtained from medical chart review as patient is intubated and acutely encephalopathic.  42 year old male with past medical history of HTN, HLD, CAD with prior MI in 2017 with reported 5 stents (in [place]), presenting after witnessed cardiac arrest at home.    Patients wife reports that they have recently moved here from [place] in the last year.  Patient had complained of what he called "indigestion" around bedtime on 4/29 and took tums for.  Since, he has had no complaints.  Additionally patient on chronic brilinta and aspirin since MI but has ran out of aspirin 1 week ago.  Patient had been at his son's baseball game and was at cookout and appeared normal before getting up from chair and going unresponsive.  Wife isn't sure if CPR was started prior to the Fire department.   Found in vfib by EMS, received multiple shocks and king airway placed with ROSC after 10-15 mins.  EKG noted for inferior STEMI.  Remained unnresponsive in ER, therefore therapeutic cooling initiated.  Multiple runs of vtach requiring defib in ER.  Taken emergently for cardiac catheterization by Cardiology.  PCCM consulted for admission.  PAST MEDICAL HISTORY :  He  has a past medical history of MI (myocardial infarction) (HCC).  PAST SURGICAL HISTORY: He  has no past surgical history on file.  Not on File  No current facility-administered medications on file prior to encounter.    No current outpatient medications on file prior to encounter.    FAMILY HISTORY:  His has no family status information on file.    SOCIAL HISTORY: He  reports that he drank alcohol.  REVIEW OF SYSTEMS:   Unable  SUBJECTIVE:   VITAL SIGNS: BP (!) 99/57 (BP Location: Left Arm)   Pulse 83    Resp 20   Ht 6' (1.829 m)   Wt 225 lb (102.1 kg)   SpO2 (!) 76%   BMI 30.52 kg/m   HEMODYNAMICS:    VENTILATOR SETTINGS: Vent Mode: PRVC FiO2 (%):  [100 %] 100 % Set Rate:  [20 bmp] 20 bmp Vt Set:  [700 mL] 700 mL PEEP:  [8 cmH20] 8 cmH20 Plateau Pressure:  [22 cmH20] 22 cmH20  INTAKE / OUTPUT: No intake/output data recorded.  PHYSICAL EXAMINATION: General:  Critically ill adult male sedated and unresponsive on MV HEENT: MM pink/moist, ETT/ OGT, pupils 3/ reactive Neuro: sedated, otherwise unresponsive, no cough CV:rrr, no m/r/g PULM: even/non-labored on MV, breathing above MV set rate, lungs bilaterally coarse GI: soft, ND,  bs active  Extremities: warm/dry, no BLE edema  Skin: no rashes or lesions  LABS:  BMET Recent Labs  Lab 07/30/17 1935 07/30/17 1939  NA 139 140  K 3.7 3.5  CL 105 108  CO2 11*  --   BUN 11 13  CREATININE 1.57* 1.50*  GLUCOSE 180* 177*    Electrolytes Recent Labs  Lab 07/30/17 1935  CALCIUM 8.3*    CBC Recent Labs  Lab 07/30/17 1935 07/30/17 1939  WBC 11.3*  --   HGB 15.3 16.3  HCT 45.5 48.0  PLT 236  --     Coag's Recent Labs  Lab 07/30/17 1935  APTT 33  INR 1.17    Sepsis Markers No results  for input(s): LATICACIDVEN, PROCALCITON, O2SATVEN in the last 168 hours.  ABG No results for input(s): PHART, PCO2ART, PO2ART in the last 168 hours.  Liver Enzymes Recent Labs  Lab 07/30/17 1935  AST 564*  ALT 618*  ALKPHOS 78  BILITOT 0.6  ALBUMIN 3.8    Cardiac Enzymes Recent Labs  Lab 07/30/17 1935  TROPONINI 11.07*    Glucose No results for input(s): GLUCAP in the last 168 hours.  Imaging No results found.  STUDIES:  5/4 LHC >> LAD and cir patent;  100% occlusion of RCA from prox to mid s/p stents x 2, ballooning and thrombectomy; distal RCA remains closed, some collaterals present  CULTURES: 5/4 MRSA PCR >>  ANTIBIOTICS: none  SIGNIFICANT EVENTS: 5/4 Admit/ LHC/ TTM  LINES/TUBES: PIV   5/4 ETT >> 5/4 foley >>  DISCUSSION: 47 yoM presenting with witnessed vfib cardiac arrest.  Multiple defibrillations.  STEMI on EKG.  Emergent LHC and TTM initiated after remaining unresponsive.   ASSESSMENT / PLAN:  PULMONARY A: Acute respiratory failure in the setting of cardiac arrest P:   Full MV support, PRVC 8 cc/kg Wean Fio2/ peep for sats > 94% ABG and CXR now and trend VAP measures pepcid for SUP Albuterol prn   CARDIOVASCULAR A:  Vfib Cardiac arrest with refractory Vtach Shock- cardiogenic Hx CAD/ MI s/p ?5 stents in 2017, HTN, HLD P:  Tele monitoring Per Cards Continue levophed Goal MAP > 65 Amio and ASA per cards cangrelor d/c'd due to bleeding in ETT/ subglottic in cath lab Trend troponins/ EKGs - anterior STE noted on EKG on arrival to ICU, some noted on initial EKG, spoke with Dr. Sharyn Lull, no intervention needed, LAD and circ patent Trend lactate   RENAL A:   AKI  AGMA P:   NS at  BMET q 2 hrs Trend BMP/ mag/ phos/ daily wts/ urinary output Replace electrolytes as indicated  GASTROINTESTINAL A:   Transaminitis  P:   Trend LFTs NPO Bowel regimen for PAD protocol  HEMATOLOGIC A:   Leukocytosis  P:  Trend CBC and coags SCDs for now Assess HIV  INFECTIOUS A:   Leukocytosis -   P:   Assess PCT Trend WBC/ fever curve Monitor clinically  ENDOCRINE A:   Hyperglycemia - HgbA1c 5.6 P:   CBG q 4 SSI  NEUROLOGIC A:   Acute encephalopathy - concerning for anoxic injury  P:   TTM protocol at 33 degrees RASS goal: -4/-5 PAD protocol with fentanyl, versed Nimbex Monitor TOF and BIS Assess EEG   FAMILY  - Updates:  Wife updated at length.    - Inter-disciplinary family meet or Palliative Care meeting due by:  5/11  CCT 60 mins  Pulmonary and Critical Care Medicine [facility] Pager: [phone]  07/30/2017, 9:27 PM

## 2017-07-30 NOTE — Progress Notes (Signed)
   07/30/17 2000  Clinical Encounter Type  Visited With Patient;Family  Visit Type Code  Referral From Nurse  Consult/Referral To Chaplain  Spiritual Encounters  Spiritual Needs Emotional;Prayer  Stress Factors  Family Stress Factors Major life changes;Health changes  Chaplain was called to be with the PT family.  PT came in as a  CPR / Stemi.  Chaplain prayed with family in agreement with their prayers as we walked the halls to the consult room.  The family was taken to the 2H waiting area as PT was moved to Cath Lab.

## 2017-07-30 NOTE — Progress Notes (Signed)
Pt was manually ventilated via Bagged Ventilation through ETT 100% to the Cath. Patient had a good palpitate pulse right carotid pulse specifically throughout the trip to the cath lab. Uneventful trip. Patient care is now in the hands of the Cath Lab team at this time.

## 2017-07-31 ENCOUNTER — Inpatient Hospital Stay (HOSPITAL_COMMUNITY): Payer: BLUE CROSS/BLUE SHIELD

## 2017-07-31 DIAGNOSIS — G931 Anoxic brain damage, not elsewhere classified: Secondary | ICD-10-CM

## 2017-07-31 LAB — POCT I-STAT, CHEM 8
BUN: 15 mg/dL (ref 6–20)
BUN: 17 mg/dL (ref 6–20)
BUN: 18 mg/dL (ref 6–20)
BUN: 18 mg/dL (ref 6–20)
BUN: 18 mg/dL (ref 6–20)
BUN: 19 mg/dL (ref 6–20)
BUN: 19 mg/dL (ref 6–20)
BUN: 21 mg/dL — AB (ref 6–20)
BUN: 22 mg/dL — ABNORMAL HIGH (ref 6–20)
CALCIUM ION: 0.98 mmol/L — AB (ref 1.15–1.40)
CALCIUM ION: 0.98 mmol/L — AB (ref 1.15–1.40)
CALCIUM ION: 0.98 mmol/L — AB (ref 1.15–1.40)
CALCIUM ION: 0.98 mmol/L — AB (ref 1.15–1.40)
CALCIUM ION: 0.95 mmol/L — AB (ref 1.15–1.40)
CHLORIDE: 105 mmol/L (ref 101–111)
CHLORIDE: 105 mmol/L (ref 101–111)
CHLORIDE: 106 mmol/L (ref 101–111)
CHLORIDE: 106 mmol/L (ref 101–111)
CREATININE: 2.1 mg/dL — AB (ref 0.61–1.24)
CREATININE: 1.8 mg/dL — AB (ref 0.61–1.24)
CREATININE: 1.8 mg/dL — AB (ref 0.61–1.24)
CREATININE: 1.6 mg/dL — AB (ref 0.61–1.24)
Calcium, Ion: 0.96 mmol/L — ABNORMAL LOW (ref 1.15–1.40)
Calcium, Ion: 0.98 mmol/L — ABNORMAL LOW (ref 1.15–1.40)
Calcium, Ion: 0.99 mmol/L — ABNORMAL LOW (ref 1.15–1.40)
Calcium, Ion: 1.01 mmol/L — ABNORMAL LOW (ref 1.15–1.40)
Chloride: 105 mmol/L (ref 101–111)
Chloride: 104 mmol/L (ref 101–111)
Chloride: 106 mmol/L (ref 101–111)
Chloride: 105 mmol/L (ref 101–111)
Chloride: 106 mmol/L (ref 101–111)
Creatinine, Ser: 1.6 mg/dL — ABNORMAL HIGH (ref 0.61–1.24)
Creatinine, Ser: 1.7 mg/dL — ABNORMAL HIGH (ref 0.61–1.24)
Creatinine, Ser: 1.9 mg/dL — ABNORMAL HIGH (ref 0.61–1.24)
Creatinine, Ser: 2 mg/dL — ABNORMAL HIGH (ref 0.61–1.24)
Creatinine, Ser: 2.2 mg/dL — ABNORMAL HIGH (ref 0.61–1.24)
GLUCOSE: 255 mg/dL — AB (ref 65–99)
GLUCOSE: 191 mg/dL — AB (ref 65–99)
GLUCOSE: 150 mg/dL — AB (ref 65–99)
GLUCOSE: 129 mg/dL — AB (ref 65–99)
Glucose, Bld: 154 mg/dL — ABNORMAL HIGH (ref 65–99)
Glucose, Bld: 165 mg/dL — ABNORMAL HIGH (ref 65–99)
Glucose, Bld: 167 mg/dL — ABNORMAL HIGH (ref 65–99)
Glucose, Bld: 175 mg/dL — ABNORMAL HIGH (ref 65–99)
Glucose, Bld: 193 mg/dL — ABNORMAL HIGH (ref 65–99)
HCT: 34 % — ABNORMAL LOW (ref 39.0–52.0)
HCT: 34 % — ABNORMAL LOW (ref 39.0–52.0)
HCT: 37 % — ABNORMAL LOW (ref 39.0–52.0)
HCT: 37 % — ABNORMAL LOW (ref 39.0–52.0)
HCT: 37 % — ABNORMAL LOW (ref 39.0–52.0)
HCT: 38 % — ABNORMAL LOW (ref 39.0–52.0)
HCT: 40 % (ref 39.0–52.0)
HEMATOCRIT: 40 % (ref 39.0–52.0)
HEMATOCRIT: 40 % (ref 39.0–52.0)
HEMOGLOBIN: 13.6 g/dL (ref 13.0–17.0)
HEMOGLOBIN: 13.6 g/dL (ref 13.0–17.0)
HEMOGLOBIN: 12.6 g/dL — AB (ref 13.0–17.0)
Hemoglobin: 11.6 g/dL — ABNORMAL LOW (ref 13.0–17.0)
Hemoglobin: 11.6 g/dL — ABNORMAL LOW (ref 13.0–17.0)
Hemoglobin: 12.6 g/dL — ABNORMAL LOW (ref 13.0–17.0)
Hemoglobin: 12.6 g/dL — ABNORMAL LOW (ref 13.0–17.0)
Hemoglobin: 12.9 g/dL — ABNORMAL LOW (ref 13.0–17.0)
Hemoglobin: 13.6 g/dL (ref 13.0–17.0)
POTASSIUM: 3.6 mmol/L (ref 3.5–5.1)
POTASSIUM: 3.7 mmol/L (ref 3.5–5.1)
POTASSIUM: 4 mmol/L (ref 3.5–5.1)
POTASSIUM: 4.7 mmol/L (ref 3.5–5.1)
Potassium: 3.6 mmol/L (ref 3.5–5.1)
Potassium: 3.4 mmol/L — ABNORMAL LOW (ref 3.5–5.1)
Potassium: 3.3 mmol/L — ABNORMAL LOW (ref 3.5–5.1)
Potassium: 4 mmol/L (ref 3.5–5.1)
Potassium: 3.5 mmol/L (ref 3.5–5.1)
SODIUM: 140 mmol/L (ref 135–145)
SODIUM: 141 mmol/L (ref 135–145)
SODIUM: 139 mmol/L (ref 135–145)
SODIUM: 139 mmol/L (ref 135–145)
SODIUM: 141 mmol/L (ref 135–145)
Sodium: 138 mmol/L (ref 135–145)
Sodium: 139 mmol/L (ref 135–145)
Sodium: 140 mmol/L (ref 135–145)
Sodium: 141 mmol/L (ref 135–145)
TCO2: 17 mmol/L — ABNORMAL LOW (ref 22–32)
TCO2: 18 mmol/L — AB (ref 22–32)
TCO2: 18 mmol/L — ABNORMAL LOW (ref 22–32)
TCO2: 19 mmol/L — AB (ref 22–32)
TCO2: 20 mmol/L — ABNORMAL LOW (ref 22–32)
TCO2: 20 mmol/L — ABNORMAL LOW (ref 22–32)
TCO2: 20 mmol/L — ABNORMAL LOW (ref 22–32)
TCO2: 22 mmol/L (ref 22–32)
TCO2: 24 mmol/L (ref 22–32)

## 2017-07-31 LAB — HIV ANTIBODY (ROUTINE TESTING W REFLEX): HIV SCREEN 4TH GENERATION: NONREACTIVE

## 2017-07-31 LAB — POCT I-STAT 3, ART BLOOD GAS (G3+)
Acid-Base Excess: 1 mmol/L (ref 0.0–2.0)
Bicarbonate: 24 mmol/L (ref 20.0–28.0)
O2 SAT: 100 %
PCO2 ART: 32.8 mmHg (ref 32.0–48.0)
PH ART: 7.472 — AB (ref 7.350–7.450)
PO2 ART: 383 mmHg — AB (ref 83.0–108.0)
TCO2: 25 mmol/L (ref 22–32)

## 2017-07-31 LAB — CBC
HCT: 39.9 % (ref 39.0–52.0)
Hemoglobin: 13.6 g/dL (ref 13.0–17.0)
MCH: 30.6 pg (ref 26.0–34.0)
MCHC: 34.1 g/dL (ref 30.0–36.0)
MCV: 89.7 fL (ref 78.0–100.0)
PLATELETS: 295 10*3/uL (ref 150–400)
RBC: 4.45 MIL/uL (ref 4.22–5.81)
RDW: 13.6 % (ref 11.5–15.5)
WBC: 11.9 10*3/uL — ABNORMAL HIGH (ref 4.0–10.5)

## 2017-07-31 LAB — GLUCOSE, CAPILLARY
GLUCOSE-CAPILLARY: 172 mg/dL — AB (ref 65–99)
GLUCOSE-CAPILLARY: 155 mg/dL — AB (ref 65–99)
GLUCOSE-CAPILLARY: 165 mg/dL — AB (ref 65–99)
GLUCOSE-CAPILLARY: 189 mg/dL — AB (ref 65–99)
GLUCOSE-CAPILLARY: 129 mg/dL — AB (ref 65–99)
Glucose-Capillary: 132 mg/dL — ABNORMAL HIGH (ref 65–99)
Glucose-Capillary: 137 mg/dL — ABNORMAL HIGH (ref 65–99)
Glucose-Capillary: 146 mg/dL — ABNORMAL HIGH (ref 65–99)
Glucose-Capillary: 151 mg/dL — ABNORMAL HIGH (ref 65–99)
Glucose-Capillary: 184 mg/dL — ABNORMAL HIGH (ref 65–99)
Glucose-Capillary: 206 mg/dL — ABNORMAL HIGH (ref 65–99)
Glucose-Capillary: 224 mg/dL — ABNORMAL HIGH (ref 65–99)

## 2017-07-31 LAB — BLOOD GAS, ARTERIAL
Acid-base deficit: 4.8 mmol/L — ABNORMAL HIGH (ref 0.0–2.0)
BICARBONATE: 18.5 mmol/L — AB (ref 20.0–28.0)
FIO2: 50
LHR: 20 {breaths}/min
O2 SAT: 99.3 %
PATIENT TEMPERATURE: 98.6
PEEP: 8 cmH2O
PH ART: 7.449 (ref 7.350–7.450)
VT: 700 mL
pCO2 arterial: 27.1 mmHg — ABNORMAL LOW (ref 32.0–48.0)
pO2, Arterial: 177 mmHg — ABNORMAL HIGH (ref 83.0–108.0)

## 2017-07-31 LAB — BASIC METABOLIC PANEL
ANION GAP: 13 (ref 5–15)
Anion gap: 10 (ref 5–15)
Anion gap: 14 (ref 5–15)
BUN: 16 mg/dL (ref 6–20)
BUN: 16 mg/dL (ref 6–20)
BUN: 17 mg/dL (ref 6–20)
CHLORIDE: 106 mmol/L (ref 101–111)
CHLORIDE: 106 mmol/L (ref 101–111)
CO2: 17 mmol/L — AB (ref 22–32)
CO2: 20 mmol/L — AB (ref 22–32)
CO2: 18 mmol/L — ABNORMAL LOW (ref 22–32)
Calcium: 7 mg/dL — ABNORMAL LOW (ref 8.9–10.3)
Calcium: 7.3 mg/dL — ABNORMAL LOW (ref 8.9–10.3)
Calcium: 7.3 mg/dL — ABNORMAL LOW (ref 8.9–10.3)
Chloride: 104 mmol/L (ref 101–111)
Creatinine, Ser: 1.97 mg/dL — ABNORMAL HIGH (ref 0.61–1.24)
Creatinine, Ser: 2.17 mg/dL — ABNORMAL HIGH (ref 0.61–1.24)
Creatinine, Ser: 2.13 mg/dL — ABNORMAL HIGH (ref 0.61–1.24)
GFR calc Af Amer: 47 mL/min — ABNORMAL LOW (ref >60)
GFR calc Af Amer: 42 mL/min — ABNORMAL LOW (ref >60)
GFR calc non Af Amer: 36 mL/min — ABNORMAL LOW (ref >60)
GFR, EST AFRICAN AMERICAN: 41 mL/min — AB (ref >60)
GFR, EST NON AFRICAN AMERICAN: 40 mL/min — AB (ref >60)
GFR, EST NON AFRICAN AMERICAN: 37 mL/min — AB (ref >60)
GLUCOSE: 257 mg/dL — AB (ref 65–99)
GLUCOSE: 184 mg/dL — AB (ref 65–99)
Glucose, Bld: 146 mg/dL — ABNORMAL HIGH (ref 65–99)
POTASSIUM: 4.7 mmol/L (ref 3.5–5.1)
POTASSIUM: 4 mmol/L (ref 3.5–5.1)
Potassium: 3.7 mmol/L (ref 3.5–5.1)
SODIUM: 136 mmol/L (ref 135–145)
SODIUM: 136 mmol/L (ref 135–145)
Sodium: 136 mmol/L (ref 135–145)

## 2017-07-31 LAB — APTT
APTT: 69 s — AB (ref 24–36)
aPTT: 40 seconds — ABNORMAL HIGH (ref 24–36)

## 2017-07-31 LAB — URINALYSIS, ROUTINE W REFLEX MICROSCOPIC
Bilirubin Urine: NEGATIVE
GLUCOSE, UA: NEGATIVE mg/dL
KETONES UR: NEGATIVE mg/dL
Leukocytes, UA: NEGATIVE
Nitrite: NEGATIVE
PH: 5 (ref 5.0–8.0)
PROTEIN: 100 mg/dL — AB
Specific Gravity, Urine: >1.046 — ABNORMAL HIGH (ref 1.005–1.030)

## 2017-07-31 LAB — HEPATIC FUNCTION PANEL
ALBUMIN: 3.3 g/dL — AB (ref 3.5–5.0)
ALT: 546 U/L — ABNORMAL HIGH (ref 17–63)
AST: 625 U/L — AB (ref 15–41)
Alkaline Phosphatase: 56 U/L (ref 38–126)
Bilirubin, Direct: 0.2 mg/dL (ref 0.1–0.5)
Indirect Bilirubin: 0.4 mg/dL (ref 0.3–0.9)
TOTAL PROTEIN: 5.9 g/dL — AB (ref 6.5–8.1)
Total Bilirubin: 0.6 mg/dL (ref 0.3–1.2)

## 2017-07-31 LAB — MRSA PCR SCREENING: MRSA BY PCR: NEGATIVE

## 2017-07-31 LAB — PHOSPHORUS: Phosphorus: 1.8 mg/dL — ABNORMAL LOW (ref 2.5–4.6)

## 2017-07-31 LAB — LACTIC ACID, PLASMA
LACTIC ACID, VENOUS: 3.5 mmol/L — AB (ref 0.5–1.9)
LACTIC ACID, VENOUS: 2.2 mmol/L — AB (ref 0.5–1.9)
Lactic Acid, Venous: 2.5 mmol/L (ref 0.5–1.9)

## 2017-07-31 LAB — ECHOCARDIOGRAM COMPLETE
Height: 72 in
Weight: 3650.46 oz

## 2017-07-31 LAB — TROPONIN I
Troponin I: 48.8 ng/mL (ref <0.03)
Troponin I: 55.83 ng/mL (ref <0.03)
Troponin I: 17.47 ng/mL (ref <0.03)
Troponin I: 15.02 ng/mL (ref <0.03)

## 2017-07-31 LAB — PROTIME-INR
INR: 2.23
Prothrombin Time: 24.5 seconds — ABNORMAL HIGH (ref 11.4–15.2)

## 2017-07-31 LAB — PROCALCITONIN: Procalcitonin: 2.59 ng/mL

## 2017-07-31 LAB — MAGNESIUM
Magnesium: 1.9 mg/dL (ref 1.7–2.4)
Magnesium: 2 mg/dL (ref 1.7–2.4)

## 2017-07-31 LAB — CORTISOL: Cortisol, Plasma: 16.3 ug/dL

## 2017-07-31 LAB — POCT ACTIVATED CLOTTING TIME: Activated Clotting Time: 153 seconds

## 2017-07-31 MED ORDER — ENOXAPARIN SODIUM 40 MG/0.4ML ~~LOC~~ SOLN
40.0000 mg | SUBCUTANEOUS | Status: DC
Start: 2017-07-31 — End: 2017-08-06
  Administered 2017-07-31 – 2017-08-06 (×7): 40 mg via SUBCUTANEOUS
  Filled 2017-07-31 (×7): qty 0.4

## 2017-07-31 MED ORDER — CHLORHEXIDINE GLUCONATE CLOTH 2 % EX PADS
6.0000 | MEDICATED_PAD | Freq: Every day | CUTANEOUS | Status: DC
  Administered 2017-07-31 – 2017-08-04 (×5): 6 via TOPICAL

## 2017-07-31 MED ORDER — ATROPINE SULFATE 1 MG/10ML IJ SOSY
PREFILLED_SYRINGE | INTRAMUSCULAR | Status: AC
  Filled 2017-07-31: qty 10

## 2017-07-31 MED ORDER — SODIUM CHLORIDE 0.9% FLUSH: 10.0000 mL | INTRAVENOUS | Status: DC | PRN

## 2017-07-31 MED ORDER — CHLORHEXIDINE GLUCONATE 0.12% ORAL RINSE (MEDLINE KIT): 15.0000 mL | Freq: Two times a day (BID) | OROMUCOSAL | Status: DC

## 2017-07-31 MED ORDER — ASPIRIN 81 MG PO CHEW
81.0000 mg | CHEWABLE_TABLET | Freq: Every day | ORAL | Status: DC
  Administered 2017-07-31: 81 mg via ORAL
  Filled 2017-07-31: qty 1

## 2017-07-31 MED ORDER — ACETAMINOPHEN 325 MG PO TABS
650.0000 mg | ORAL_TABLET | ORAL | Status: DC | PRN
  Administered 2017-07-31: 650 mg via ORAL
  Filled 2017-07-31: qty 2

## 2017-07-31 MED ORDER — POTASSIUM CHLORIDE 10 MEQ/50ML IV SOLN
10.0000 meq | INTRAVENOUS | Status: AC
  Administered 2017-07-31 – 2017-08-01 (×4): 10 meq via INTRAVENOUS
  Filled 2017-07-31 (×4): qty 50

## 2017-07-31 MED ORDER — PIPERACILLIN-TAZOBACTAM 3.375 G IVPB 30 MIN
3.3750 g | Freq: Once | INTRAVENOUS | Status: AC
  Administered 2017-07-31: 3.375 g via INTRAVENOUS
  Filled 2017-07-31: qty 50

## 2017-07-31 MED ORDER — SODIUM CHLORIDE 0.9 % IV SOLN
INTRAVENOUS | Status: DC
  Administered 2017-07-31: 21:00:00 via INTRAVENOUS

## 2017-07-31 MED ORDER — AMIODARONE IV BOLUS ONLY 150 MG/100ML
150.0000 mg | Freq: Once | INTRAVENOUS | Status: AC
  Administered 2017-07-31: 150 mg via INTRAVENOUS
  Filled 2017-07-31: qty 100

## 2017-07-31 MED ORDER — AMIODARONE HCL IN DEXTROSE 360-4.14 MG/200ML-% IV SOLN
30.0000 mg/h | INTRAVENOUS | Status: DC
  Administered 2017-07-31 – 2017-08-02 (×7): 30 mg/h via INTRAVENOUS
  Filled 2017-07-31 (×6): qty 200

## 2017-07-31 MED ORDER — SODIUM CHLORIDE 0.9 % IV SOLN: 250.0000 mL | INTRAVENOUS | Status: DC | PRN

## 2017-07-31 MED ORDER — PIPERACILLIN-TAZOBACTAM 3.375 G IVPB
3.3750 g | Freq: Three times a day (TID) | INTRAVENOUS | Status: AC
Start: 2017-07-31 — End: 2017-08-03
  Administered 2017-07-31 – 2017-08-03 (×11): 3.375 g via INTRAVENOUS
  Filled 2017-07-31 (×12): qty 50

## 2017-07-31 MED ORDER — POTASSIUM CHLORIDE 10 MEQ/50ML IV SOLN
10.0000 meq | INTRAVENOUS | Status: AC
  Administered 2017-07-31 (×2): 10 meq via INTRAVENOUS

## 2017-07-31 MED ORDER — INSULIN ASPART 100 UNIT/ML ~~LOC~~ SOLN
1.0000 [IU] | SUBCUTANEOUS | Status: DC
  Administered 2017-07-31: 2 [IU] via SUBCUTANEOUS
  Administered 2017-07-31 (×2): 1 [IU] via SUBCUTANEOUS
  Administered 2017-08-01: 2 [IU] via SUBCUTANEOUS
  Administered 2017-08-01: 1 [IU] via SUBCUTANEOUS
  Administered 2017-08-01 (×2): 2 [IU] via SUBCUTANEOUS
  Administered 2017-08-01: 1 [IU] via SUBCUTANEOUS
  Administered 2017-08-02 (×2): 2 [IU] via SUBCUTANEOUS

## 2017-07-31 MED ORDER — ONDANSETRON HCL 4 MG/2ML IJ SOLN: 4.0000 mg | Freq: Four times a day (QID) | INTRAMUSCULAR | Status: DC | PRN

## 2017-07-31 MED ORDER — TICAGRELOR 90 MG PO TABS
90.0000 mg | ORAL_TABLET | Freq: Two times a day (BID) | ORAL | Status: DC
  Administered 2017-07-31 (×2): 90 mg via ORAL
  Filled 2017-07-31 (×2): qty 1

## 2017-07-31 MED ORDER — SODIUM CHLORIDE 0.9% FLUSH: 3.0000 mL | INTRAVENOUS | Status: DC | PRN

## 2017-07-31 MED ORDER — ORAL CARE MOUTH RINSE
15.0000 mL | OROMUCOSAL | Status: DC
  Administered 2017-07-31 – 2017-08-02 (×23): 15 mL via OROMUCOSAL

## 2017-07-31 MED ORDER — ATORVASTATIN CALCIUM 80 MG PO TABS
80.0000 mg | ORAL_TABLET | Freq: Every day | ORAL | Status: DC
  Administered 2017-07-31: 80 mg via ORAL
  Filled 2017-07-31: qty 1

## 2017-07-31 MED ORDER — SODIUM GLYCEROPHOSPHATE 1 MMOLE/ML IV SOLN
20.0000 mmol | Freq: Once | INTRAVENOUS | Status: AC
  Administered 2017-07-31: 20 mmol via INTRAVENOUS
  Filled 2017-07-31: qty 20

## 2017-07-31 MED ORDER — SODIUM CHLORIDE 0.9% FLUSH
10.0000 mL | Freq: Two times a day (BID) | INTRAVENOUS | Status: DC
  Administered 2017-07-31 – 2017-08-06 (×11): 10 mL

## 2017-07-31 MED ORDER — SODIUM CHLORIDE 0.9% FLUSH
3.0000 mL | Freq: Two times a day (BID) | INTRAVENOUS | Status: DC
  Administered 2017-07-31 – 2017-08-06 (×12): 3 mL via INTRAVENOUS

## 2017-07-31 MED ORDER — MAGNESIUM SULFATE IN D5W 1-5 GM/100ML-% IV SOLN
1.0000 g | Freq: Once | INTRAVENOUS | Status: AC
  Administered 2017-07-31: 1 g via INTRAVENOUS
  Filled 2017-07-31: qty 100

## 2017-07-31 MED ORDER — SODIUM CHLORIDE 0.9 % IV SOLN
INTRAVENOUS | Status: AC
  Administered 2017-07-31: 03:00:00 via INTRAVENOUS

## 2017-07-31 NOTE — Plan of Care (Signed)
Pt being turned Q2 and pt reached goal of 33 degrees celsius. Cardiac Hemodynamics WDL and vascular site is level 0

## 2017-07-31 NOTE — Progress Notes (Signed)
EEG completed; results pending.    

## 2017-07-31 NOTE — Progress Notes (Signed)
Patient transported to CT and returned to 2H02 without complications. Vital signs stable at this time. RT will continue to monitor.

## 2017-07-31 NOTE — Progress Notes (Addendum)
eLink Physician-Brief Progress Note Patient Name: Lee Moore DOB: November 01, 1975 MRN: [id number]   Date of Service  07/31/2017  HPI/Events of Note  14 beat run of NSVT. Currently on Amiodarone IV infusion. S/p VFIB arrest and stents today.  K+ = 3.5.  eICU Interventions  Will order: 1. Mg++ level STAT. 2. Replace K+ 3. Amiodarone 150 mg IV bolus over 10 minutes now.      Intervention Category Major Interventions: Arrhythmia - evaluation and management  Sommer,Steven Eugene 07/31/2017, 9:59 PM

## 2017-07-31 NOTE — Progress Notes (Signed)
When stimulated by being repositioned in bed, Pt appeared to open his eyes.  Nimbex, fentanyl and Versed increased.  MD notified, BIS monitor added reading in the 40's after increase in medications.    Lonia Blood, RN

## 2017-07-31 NOTE — Progress Notes (Signed)
CRITICAL VALUE ALERT  Critical Value:  Lactic Acid 3.5  Date & Time Notied:  07/30/17   Provider Notified: E-Link MD  Orders Received/Actions taken: No new orders

## 2017-07-31 NOTE — Progress Notes (Signed)
Pharmacy Antibiotic Note  Lee Moore is a 42 y.o. male admitted on 07/30/2017 after witnessed arrest requiring CPR and multiple shocks, now s/p cath and being cooled, concern for aspiration pneumonia.  Pharmacy has been consulted for Zosyn dosing.  Plan: Zosyn 3.375g IV q8h (4-hour infusion).  Height: 6' (182.9 cm) Weight: 228 lb 2.5 oz (103.5 kg) IBW/kg (Calculated) : 77.6  Temp (24hrs), Avg:97.2 F (36.2 C), Min:92.3 F (33.5 C), Max:99.9 F (37.7 C)  Recent Labs  Lab 07/30/17 1935  07/30/17 2306  07/31/17 0110 07/31/17 0129 07/31/17 0255 07/31/17 0342 07/31/17 0513  WBC 11.3*  --   --   --   --   --   --   --   --   CREATININE 1.57*   < >  --    < > 1.97* 2.00* 2.17* 2.20* 2.10*  LATICACIDVEN  --   --  3.5*  --   --   --   --   --   --    < > = values in this interval not displayed.    Estimated Creatinine Clearance: 57 mL/min (A) (by C-G formula based on SCr of 2.1 mg/dL (H)).     Thank you for allowing pharmacy to be a part of this patient's care.  Vernard Gambles, PharmD, BCPS  07/31/2017 5:48 AM

## 2017-07-31 NOTE — Procedures (Signed)
History: 42 year old male status post cardiac arrest with hypothermia protocol  Sedation: Versed, fentanyl  Technique: This is a 21 channel routine scalp EEG performed at the bedside with bipolar and monopolar montages arranged in accordance to the international 10/20 system of electrode placement. One channel was dedicated to EKG recording.    Background: The EEG consists of predominantly theta and delta activities with some anteriorly predominant beta activity as well.  There was no epileptiform activity.  There were occasional runs of activity consistent with sleep structures.  Photic stimulation: Physiologic driving is not performed  EEG Abnormalities: 1) generalized irregular slow activity  Clinical Interpretation: This EEG is consistent with a generalized nonspecific cerebral dysfunction (encephalopathy).  Though nonspecific, this would be consistent with a sedated state.    There was no seizure or seizure predisposition recorded on this study. Please note that a normal EEG does not preclude the possibility of epilepsy.   Ritta Slot, MD [facility] [phone]  If 7pm- 7am, please page neurology on call as listed in AMION.

## 2017-07-31 NOTE — Progress Notes (Signed)
  Echocardiogram 2D Echocardiogram has been performed.  Roosvelt Maser F 07/31/2017, 4:58 PM

## 2017-07-31 NOTE — Progress Notes (Signed)
14 runs of V-tach, notified elink, inform of decrease urine output, bladder scan 63cc. elink nurse will notify MD.

## 2017-07-31 NOTE — Progress Notes (Signed)
Pt achieved goal of 33 degrees celsius at 0507. Pt did not reach goal within 4 hours of initiation of "code cool". 2H staff attempted to apply cooling pads to pt in cath lab but was not able to due to cath lab procedure. Ice packs on pt upon arrival to unit and ice packs remained on pt until goal reached.This RN attempted to utilize esophageal probe along w/ foley probe in order to gauge most accurate temperature of pt since the two readings were not corresponding while trying to reach goal (differences of at least 1 degree celsius). MD Hammonds made aware of attempt to apply cooling pads prior to arrival from cath lab.

## 2017-07-31 NOTE — Procedures (Signed)
Arterial Catheter Insertion Procedure Note Lee Moore [id number] 1975/05/20  Procedure: Insertion of Arterial Catheter  Indications: Blood pressure monitoring and Frequent blood sampling  Procedure Details Consent: Unable to obtain consent because of emergent medical necessity. Time Out: Verified patient identification, verified procedure, site/side was marked, verified correct patient position, special equipment/implants available, medications/allergies/relevent history reviewed, required imaging and test results available.  Performed  Maximum sterile technique was used including antiseptics, cap, gloves, gown, hand hygiene, mask and sheet. Skin prep: Chlorhexidine; local anesthetic administered 20 gauge catheter was inserted into right radial artery using the Seldinger technique. ULTRASOUND GUIDANCE USED: NO Evaluation Blood flow good; BP tracing good. Complications: No apparent complications.   Lang Snow 07/31/2017

## 2017-07-31 NOTE — Progress Notes (Signed)
CRITICAL VALUE ALERT  Critical Value:  Troponin 48.80  Date & Time Notied:  07/31/17 @ 0150  Provider Notified: E-link MD   Orders Received/Actions taken: No new orders

## 2017-07-31 NOTE — Procedures (Signed)
Central Venous Catheter Insertion Procedure Note Lee Moore [id number] 21-Oct-1975  Procedure: Insertion of Central Venous Catheter Indications: Assessment of intravascular volume, Drug and/or fluid administration and Frequent blood sampling  Procedure Details Consent: Unable to obtain consent because of emergent medical necessity. Time Out: Verified patient identification, verified procedure, site/side was marked, verified correct patient position, special equipment/implants available, medications/allergies/relevent history reviewed, required imaging and test results available.  Performed  Maximum sterile technique was used including antiseptics, cap, gloves, gown, hand hygiene, mask and sheet. Skin prep: Chlorhexidine; local anesthetic administered A antimicrobial bonded/coated triple lumen catheter was placed in the right internal jugular vein using the Seldinger technique.  Evaluation Blood flow good Complications: No apparent complications Patient did tolerate procedure well. Chest X-ray ordered to verify placement.  CXR: normal.  Lee Moore 07/31/2017, 12:11 AM

## 2017-07-31 NOTE — Progress Notes (Signed)
Ref: Patient, No Pcp Per   Subjective:  Intubated and under hypothermia protocol.  Sinus rhythm.  On versed, Levophed and Nimbex drips.    Objective:  Vital Signs in the last 24 hours: Temp:  [89.1 F (31.7 C)-99.9 F (37.7 C)] 91.8 F (33.2 C) (05/05 1000) Pulse Rate:  [0-149] 65 (05/05 1132) Cardiac Rhythm: Normal sinus rhythm (05/05 0800) Resp:  [0-28] 24 (05/05 1000) BP: (59-200)/(41-145) 105/77 (05/05 1132) SpO2:  [0 %-100 %] 100 % (05/05 0800) Arterial Line BP: (85-135)/(63-101) 100/77 (05/05 1000) FiO2 (%):  [40 %-100 %] 40 % (05/05 1132) Weight:  [102.1 kg (225 lb)-103.5 kg (228 lb 2.5 oz)] 103.5 kg (228 lb 2.5 oz) (05/05 0410)  Physical Exam: BP Readings from Last 1 Encounters:  07/31/17 105/77     Wt Readings from Last 1 Encounters:  07/31/17 103.5 kg (228 lb 2.5 oz)    Weight change:  Body mass index is 30.94 kg/m. HEENT: [place]/AT, Eyes-Brown, Conjunctiva-Pink, Sclera-Non-icteric. Intubated. Neck: No JVD, No bruit, Trachea midline. Lungs:  Clearing, Bilateral. Cardiac:  Regular rhythm, normal S1 and S2, no S3. II/VI systolic murmur. Abdomen:  Soft, non-tender. BS present. Extremities:  No edema present. No cyanosis. No clubbing. CNS: AxOx0, Sedated.  Skin: cool and dry.   Intake/Output from previous day: 05/04 0701 - 05/05 0700 In: 1587.2 [I.V.:1537.2; IV Piggyback:50] Out: 715 [Urine:415; Emesis/NG output:300]    Lab Results: BMET    Component Value Date/Time   NA 140 07/31/2017 0910   NA 136 07/31/2017 0515   NA 139 07/31/2017 0513   K 3.6 07/31/2017 0910   K 4.0 07/31/2017 0515   K 4.0 07/31/2017 0513   CL 105 07/31/2017 0910   CL 104 07/31/2017 0515   CL 105 07/31/2017 0513   CO2 18 (L) 07/31/2017 0515   CO2 20 (L) 07/31/2017 0255   CO2 17 (L) 07/31/2017 0110   GLUCOSE 191 (H) 07/31/2017 0910   GLUCOSE 184 (H) 07/31/2017 0515   GLUCOSE 193 (H) 07/31/2017 0513   BUN 19 07/31/2017 0910   BUN 17 07/31/2017 0515   BUN 19 07/31/2017 0513   CREATININE 1.80 (H) 07/31/2017 0910   CREATININE 2.13 (H) 07/31/2017 0515   CREATININE 2.10 (H) 07/31/2017 0513   CALCIUM 7.3 (L) 07/31/2017 0515   CALCIUM 7.3 (L) 07/31/2017 0255   CALCIUM 7.0 (L) 07/31/2017 0110   GFRNONAA 37 (L) 07/31/2017 0515   GFRNONAA 36 (L) 07/31/2017 0255   GFRNONAA 40 (L) 07/31/2017 0110   GFRAA 42 (L) 07/31/2017 0515   GFRAA 41 (L) 07/31/2017 0255   GFRAA 47 (L) 07/31/2017 0110   CBC    Component Value Date/Time   WBC 11.9 (H) 07/31/2017 0515   RBC 4.45 07/31/2017 0515   HGB 12.9 (L) 07/31/2017 0910   HCT 38.0 (L) 07/31/2017 0910   PLT 295 07/31/2017 0515   MCV 89.7 07/31/2017 0515   MCH 30.6 07/31/2017 0515   MCHC 34.1 07/31/2017 0515   RDW 13.6 07/31/2017 0515   HEPATIC Function Panel Recent Labs    07/30/17 1935 07/31/17 0515  PROT 6.8 5.9*   HEMOGLOBIN A1C No components found for: HGA1C,  MPG CARDIAC ENZYMES Lab Results  Component Value Date   TROPONINI 55.83 ([facility]) 07/31/2017   TROPONINI 48.80 ([place]) 07/31/2017   TROPONINI 11.07 ([place]) 07/30/2017   BNP No results for input(s): PROBNP in the last 8760 hours. TSH No results for input(s): TSH in the last 8760 hours. CHOLESTEROL Recent Labs    07/30/17 2005  CHOL 133    Scheduled Meds: . artificial tears  1 application Both Eyes T2W  . aspirin  81 mg Oral Daily  . atorvastatin  80 mg Oral q1800  . chlorhexidine gluconate (MEDLINE KIT)  15 mL Mouth Rinse BID  . docusate  100 mg Per Tube BID  . enoxaparin (LOVENOX) injection  40 mg Subcutaneous Q24H  . insulin aspart  1-3 Units Subcutaneous Q4H  . mouth rinse  15 mL Mouth Rinse 10 times per day  . sennosides  5 mL Per Tube QHS  . sodium chloride flush  3 mL Intravenous Q12H  . ticagrelor  90 mg Oral BID   Continuous Infusions: . sodium chloride Stopped (07/30/17 0000)  . sodium chloride 75 mL/hr at 07/31/17 0700  . sodium chloride    . amiodarone 30 mg/hr (07/31/17 1006)  . cisatracurium (NIMBEX) infusion 1 mcg/kg/min  (07/31/17 0700)  . epinephrine Stopped (07/31/17 0136)  . famotidine (PEPCID) IV Stopped (07/31/17 1030)  . fentaNYL infusion INTRAVENOUS 225 mcg/hr (07/31/17 1100)  . midazolam (VERSED) infusion 6 mg/hr (07/31/17 1100)  . norepinephrine (LEVOPHED) Adult infusion 30 mcg/min (07/31/17 1100)  . piperacillin-tazobactam (ZOSYN)  IV     PRN Meds:.sodium chloride, acetaminophen, albuterol, [COMPLETED] cisatracurium **AND** cisatracurium (NIMBEX) infusion **AND** cisatracurium, fentaNYL, midazolam, ondansetron (ZOFRAN) IV, sodium chloride flush  Assessment/Plan: Acute inferior wall MI S/P V. Fibrillation cardiac arrest CAD, multivessel S/P coronary stents Hypertension Hyperlipidemia  Continue medical management. EEG report pending. Follow with CCM   LOS: 1 day    Dixie Dials  MD  07/31/2017, 11:42 AM

## 2017-07-31 NOTE — Progress Notes (Signed)
Right femoral sheath pulled. Manual pressure held for 20 min, hemostasis achieved. No bruising or hematoma noted. Pedal pulses equal and palpable. See flowsheet for vital signs. Gauze pressure dressing applied. Primary RN observed site, noted as level 0. No complications at this time.

## 2017-07-31 NOTE — Progress Notes (Signed)
Patient was seen by the overnight intensivist early this morning. I also came to assess the patient. He was sedated, paralyzed on th event on 50% FIO2. On levophed.  Chest clear abd soft no tenderness Plan: - continue hypothermia protocol  - CT head - cancel US kidney since his AKi is most likely post arrest  - wean FIO2 - wean pressors  - d/w bedside nurse

## 2017-08-01 ENCOUNTER — Inpatient Hospital Stay (HOSPITAL_COMMUNITY): Payer: BLUE CROSS/BLUE SHIELD

## 2017-08-01 ENCOUNTER — Encounter (HOSPITAL_COMMUNITY): Payer: Self-pay | Admitting: Cardiology

## 2017-08-01 DIAGNOSIS — I469 Cardiac arrest, cause unspecified: Secondary | ICD-10-CM

## 2017-08-01 DIAGNOSIS — R570 Cardiogenic shock: Secondary | ICD-10-CM

## 2017-08-01 DIAGNOSIS — I4901 Ventricular fibrillation: Secondary | ICD-10-CM

## 2017-08-01 LAB — GLUCOSE, CAPILLARY
GLUCOSE-CAPILLARY: 118 mg/dL — AB (ref 65–99)
GLUCOSE-CAPILLARY: 123 mg/dL — AB (ref 65–99)
GLUCOSE-CAPILLARY: 129 mg/dL — AB (ref 65–99)
GLUCOSE-CAPILLARY: 162 mg/dL — AB (ref 65–99)
GLUCOSE-CAPILLARY: 163 mg/dL — AB (ref 65–99)
Glucose-Capillary: 164 mg/dL — ABNORMAL HIGH (ref 65–99)
Glucose-Capillary: 77 mg/dL (ref 65–99)

## 2017-08-01 LAB — POCT I-STAT, CHEM 8
BUN: 14 mg/dL (ref 6–20)
BUN: 19 mg/dL (ref 6–20)
BUN: 23 mg/dL — AB (ref 6–20)
BUN: 22 mg/dL — ABNORMAL HIGH (ref 6–20)
BUN: 20 mg/dL (ref 6–20)
BUN: 24 mg/dL — ABNORMAL HIGH (ref 6–20)
CALCIUM ION: 0.94 mmol/L — AB (ref 1.15–1.40)
CALCIUM ION: 0.96 mmol/L — AB (ref 1.15–1.40)
CALCIUM ION: 0.91 mmol/L — AB (ref 1.15–1.40)
CHLORIDE: 105 mmol/L (ref 101–111)
CHLORIDE: 108 mmol/L (ref 101–111)
CHLORIDE: 108 mmol/L (ref 101–111)
CHLORIDE: 108 mmol/L (ref 101–111)
CREATININE: 2 mg/dL — AB (ref 0.61–1.24)
Calcium, Ion: 1.09 mmol/L — ABNORMAL LOW (ref 1.15–1.40)
Calcium, Ion: 0.88 mmol/L — CL (ref 1.15–1.40)
Calcium, Ion: 0.86 mmol/L — CL (ref 1.15–1.40)
Chloride: 107 mmol/L (ref 101–111)
Chloride: 114 mmol/L — ABNORMAL HIGH (ref 101–111)
Creatinine, Ser: 1.7 mg/dL — ABNORMAL HIGH (ref 0.61–1.24)
Creatinine, Ser: 1.7 mg/dL — ABNORMAL HIGH (ref 0.61–1.24)
Creatinine, Ser: 1.9 mg/dL — ABNORMAL HIGH (ref 0.61–1.24)
Creatinine, Ser: 2 mg/dL — ABNORMAL HIGH (ref 0.61–1.24)
Creatinine, Ser: 2.2 mg/dL — ABNORMAL HIGH (ref 0.61–1.24)
Glucose, Bld: 250 mg/dL — ABNORMAL HIGH (ref 65–99)
Glucose, Bld: 131 mg/dL — ABNORMAL HIGH (ref 65–99)
Glucose, Bld: 198 mg/dL — ABNORMAL HIGH (ref 65–99)
Glucose, Bld: 162 mg/dL — ABNORMAL HIGH (ref 65–99)
Glucose, Bld: 134 mg/dL — ABNORMAL HIGH (ref 65–99)
Glucose, Bld: 117 mg/dL — ABNORMAL HIGH (ref 65–99)
HCT: 35 % — ABNORMAL LOW (ref 39.0–52.0)
HCT: 33 % — ABNORMAL LOW (ref 39.0–52.0)
HEMATOCRIT: 44 % (ref 39.0–52.0)
HEMATOCRIT: 32 % — AB (ref 39.0–52.0)
HEMATOCRIT: 33 % — AB (ref 39.0–52.0)
HEMATOCRIT: 29 % — AB (ref 39.0–52.0)
Hemoglobin: 15 g/dL (ref 13.0–17.0)
Hemoglobin: 10.9 g/dL — ABNORMAL LOW (ref 13.0–17.0)
Hemoglobin: 11.9 g/dL — ABNORMAL LOW (ref 13.0–17.0)
Hemoglobin: 11.2 g/dL — ABNORMAL LOW (ref 13.0–17.0)
Hemoglobin: 11.2 g/dL — ABNORMAL LOW (ref 13.0–17.0)
Hemoglobin: 9.9 g/dL — ABNORMAL LOW (ref 13.0–17.0)
POTASSIUM: 3.9 mmol/L (ref 3.5–5.1)
POTASSIUM: 4 mmol/L (ref 3.5–5.1)
POTASSIUM: 4.3 mmol/L (ref 3.5–5.1)
POTASSIUM: 4.4 mmol/L (ref 3.5–5.1)
Potassium: 3.8 mmol/L (ref 3.5–5.1)
Potassium: 3.5 mmol/L (ref 3.5–5.1)
SODIUM: 140 mmol/L (ref 135–145)
SODIUM: 140 mmol/L (ref 135–145)
SODIUM: 140 mmol/L (ref 135–145)
SODIUM: 139 mmol/L (ref 135–145)
SODIUM: 142 mmol/L (ref 135–145)
Sodium: 140 mmol/L (ref 135–145)
TCO2: 15 mmol/L — ABNORMAL LOW (ref 22–32)
TCO2: 18 mmol/L — AB (ref 22–32)
TCO2: 18 mmol/L — ABNORMAL LOW (ref 22–32)
TCO2: 20 mmol/L — ABNORMAL LOW (ref 22–32)
TCO2: 17 mmol/L — ABNORMAL LOW (ref 22–32)
TCO2: 17 mmol/L — AB (ref 22–32)

## 2017-08-01 LAB — PHOSPHORUS: PHOSPHORUS: 3 mg/dL (ref 2.5–4.6)

## 2017-08-01 LAB — POCT I-STAT 3, ART BLOOD GAS (G3+)
ACID-BASE DEFICIT: 8 mmol/L — AB (ref 0.0–2.0)
Acid-base deficit: 16 mmol/L — ABNORMAL HIGH (ref 0.0–2.0)
Acid-base deficit: 8 mmol/L — ABNORMAL HIGH (ref 0.0–2.0)
Acid-base deficit: 8 mmol/L — ABNORMAL HIGH (ref 0.0–2.0)
Acid-base deficit: 4 mmol/L — ABNORMAL HIGH (ref 0.0–2.0)
BICARBONATE: 13 mmol/L — AB (ref 20.0–28.0)
BICARBONATE: 17.8 mmol/L — AB (ref 20.0–28.0)
BICARBONATE: 17.2 mmol/L — AB (ref 20.0–28.0)
BICARBONATE: 16.2 mmol/L — AB (ref 20.0–28.0)
Bicarbonate: 20.2 mmol/L (ref 20.0–28.0)
O2 SAT: 100 %
O2 Saturation: 84 %
O2 Saturation: 100 %
O2 Saturation: 97 %
O2 Saturation: 96 %
PCO2 ART: 39.3 mmHg (ref 32.0–48.0)
PCO2 ART: 35.2 mmHg (ref 32.0–48.0)
PCO2 ART: 30.2 mmHg — AB (ref 32.0–48.0)
PCO2 ART: 25.9 mmHg — AB (ref 32.0–48.0)
PH ART: 7.128 — AB (ref 7.350–7.450)
PH ART: 7.311 — AB (ref 7.350–7.450)
PH ART: 7.482 — AB (ref 7.350–7.450)
PO2 ART: 63 mmHg — AB (ref 83.0–108.0)
PO2 ART: 97 mmHg (ref 83.0–108.0)
PO2 ART: 87 mmHg (ref 83.0–108.0)
PO2 ART: 278 mmHg — AB (ref 83.0–108.0)
Patient temperature: 38.3
Patient temperature: 32.6
TCO2: 14 mmol/L — ABNORMAL LOW (ref 22–32)
TCO2: 19 mmol/L — AB (ref 22–32)
TCO2: 18 mmol/L — ABNORMAL LOW (ref 22–32)
TCO2: 17 mmol/L — AB (ref 22–32)
TCO2: 21 mmol/L — AB (ref 22–32)
pCO2 arterial: 33.4 mmHg (ref 32.0–48.0)
pH, Arterial: 7.325 — ABNORMAL LOW (ref 7.350–7.450)
pH, Arterial: 7.338 — ABNORMAL LOW (ref 7.350–7.450)
pO2, Arterial: 183 mmHg — ABNORMAL HIGH (ref 83.0–108.0)

## 2017-08-01 LAB — CBC WITH DIFFERENTIAL/PLATELET
BASOS ABS: 0 10*3/uL (ref 0.0–0.1)
BASOS PCT: 0 %
Eosinophils Absolute: 0 10*3/uL (ref 0.0–0.7)
Eosinophils Relative: 0 %
HCT: 33.9 % — ABNORMAL LOW (ref 39.0–52.0)
HEMOGLOBIN: 11.7 g/dL — AB (ref 13.0–17.0)
Lymphocytes Relative: 23 %
Lymphs Abs: 2.1 10*3/uL (ref 0.7–4.0)
MCH: 30 pg (ref 26.0–34.0)
MCHC: 34.5 g/dL (ref 30.0–36.0)
MCV: 86.9 fL (ref 78.0–100.0)
Monocytes Absolute: 0.7 10*3/uL (ref 0.1–1.0)
Monocytes Relative: 7 %
NEUTROS ABS: 6.4 10*3/uL (ref 1.7–7.7)
NEUTROS PCT: 70 %
Platelets: 225 10*3/uL (ref 150–400)
RBC: 3.9 MIL/uL — AB (ref 4.22–5.81)
RDW: 13.7 % (ref 11.5–15.5)
WBC: 9.2 10*3/uL (ref 4.0–10.5)

## 2017-08-01 LAB — COOXEMETRY PANEL
CARBOXYHEMOGLOBIN: 0.5 % (ref 0.5–1.5)
Methemoglobin: 1.5 % (ref 0.0–1.5)
O2 SAT: 59.4 %
Total hemoglobin: 12.1 g/dL (ref 12.0–16.0)

## 2017-08-01 LAB — BLOOD GAS, ARTERIAL
ACID-BASE DEFICIT: 4.4 mmol/L — AB (ref 0.0–2.0)
BICARBONATE: 18.4 mmol/L — AB (ref 20.0–28.0)
FIO2: 40
O2 SAT: 99.3 %
PATIENT TEMPERATURE: 98.6
PCO2 ART: 23.8 mmHg — AB (ref 32.0–48.0)
PEEP: 5 cmH2O
PH ART: 7.498 — AB (ref 7.350–7.450)
RATE: 24 resp/min
VT: 620 mL
pO2, Arterial: 159 mmHg — ABNORMAL HIGH (ref 83.0–108.0)

## 2017-08-01 LAB — COMPREHENSIVE METABOLIC PANEL
ALBUMIN: 2.6 g/dL — AB (ref 3.5–5.0)
ALT: 335 U/L — AB (ref 17–63)
AST: 219 U/L — ABNORMAL HIGH (ref 15–41)
Alkaline Phosphatase: 46 U/L (ref 38–126)
Anion gap: 10 (ref 5–15)
BUN: 21 mg/dL — ABNORMAL HIGH (ref 6–20)
CO2: 18 mmol/L — AB (ref 22–32)
Calcium: 6.7 mg/dL — ABNORMAL LOW (ref 8.9–10.3)
Chloride: 109 mmol/L (ref 101–111)
Creatinine, Ser: 1.85 mg/dL — ABNORMAL HIGH (ref 0.61–1.24)
GFR calc non Af Amer: 43 mL/min — ABNORMAL LOW (ref >60)
GFR, EST AFRICAN AMERICAN: 50 mL/min — AB (ref >60)
Glucose, Bld: 125 mg/dL — ABNORMAL HIGH (ref 65–99)
POTASSIUM: 3.8 mmol/L (ref 3.5–5.1)
Sodium: 137 mmol/L (ref 135–145)
Total Bilirubin: 0.6 mg/dL (ref 0.3–1.2)
Total Protein: 5.3 g/dL — ABNORMAL LOW (ref 6.5–8.1)

## 2017-08-01 LAB — LACTIC ACID, PLASMA: Lactic Acid, Venous: 2 mmol/L (ref 0.5–1.9)

## 2017-08-01 LAB — POCT ACTIVATED CLOTTING TIME: Activated Clotting Time: 510 seconds

## 2017-08-01 LAB — FIBRINOGEN: Fibrinogen: 539 mg/dL — ABNORMAL HIGH (ref 210–475)

## 2017-08-01 LAB — PROTIME-INR
INR: 1.4
Prothrombin Time: 17 seconds — ABNORMAL HIGH (ref 11.4–15.2)

## 2017-08-01 LAB — MAGNESIUM: MAGNESIUM: 2 mg/dL (ref 1.7–2.4)

## 2017-08-01 LAB — PROCALCITONIN: Procalcitonin: 5.04 ng/mL

## 2017-08-01 LAB — TROPONIN I
TROPONIN I: 28.8 ng/mL — AB (ref <0.03)
TROPONIN I: 27.47 ng/mL — AB (ref <0.03)
Troponin I: 21.89 ng/mL (ref <0.03)

## 2017-08-01 MED ORDER — LIDOCAINE IN D5W 4-5 MG/ML-% IV SOLN
0.5000 mg/min | INTRAVENOUS | Status: DC
  Administered 2017-08-02: 1 mg/min via INTRAVENOUS
  Filled 2017-08-01: qty 500

## 2017-08-01 MED ORDER — MIDAZOLAM HCL 2 MG/2ML IJ SOLN
2.0000 mg | INTRAMUSCULAR | Status: DC | PRN
  Administered 2017-08-01: 2 mg via INTRAVENOUS
  Filled 2017-08-01 (×2): qty 2

## 2017-08-01 MED ORDER — LIDOCAINE IN D5W 4-5 MG/ML-% IV SOLN
2.0000 mg/min | INTRAVENOUS | Status: DC
  Administered 2017-08-01: 2 mg/min via INTRAVENOUS
  Filled 2017-08-01: qty 500

## 2017-08-01 MED ORDER — LIDOCAINE BOLUS VIA INFUSION
100.0000 mg | Freq: Once | INTRAVENOUS | Status: AC
  Administered 2017-08-01: 100 mg via INTRAVENOUS
  Filled 2017-08-01: qty 100

## 2017-08-01 MED ORDER — TICAGRELOR 90 MG PO TABS
90.0000 mg | ORAL_TABLET | Freq: Two times a day (BID) | ORAL | Status: DC
  Administered 2017-08-01 (×2): 90 mg
  Filled 2017-08-01 (×2): qty 1

## 2017-08-01 MED ORDER — ACETAMINOPHEN 160 MG/5ML PO SOLN
650.0000 mg | Freq: Four times a day (QID) | ORAL | Status: DC | PRN
  Administered 2017-08-01 (×2): 650 mg
  Filled 2017-08-01 (×2): qty 20.3

## 2017-08-01 MED ORDER — FENTANYL CITRATE (PF) 100 MCG/2ML IJ SOLN
100.0000 ug | INTRAMUSCULAR | Status: DC | PRN
  Administered 2017-08-01 (×2): 100 ug via INTRAVENOUS
  Filled 2017-08-01 (×2): qty 2

## 2017-08-01 MED ORDER — ATORVASTATIN CALCIUM 80 MG PO TABS
80.0000 mg | ORAL_TABLET | Freq: Every day | ORAL | Status: DC
  Administered 2017-08-01: 80 mg
  Filled 2017-08-01: qty 1

## 2017-08-01 MED ORDER — SODIUM CHLORIDE 0.9 % IV SOLN
1.0000 g | Freq: Once | INTRAVENOUS | Status: AC
  Administered 2017-08-01: 1 g via INTRAVENOUS
  Filled 2017-08-01: qty 10

## 2017-08-01 MED ORDER — POTASSIUM CHLORIDE 10 MEQ/50ML IV SOLN
10.0000 meq | INTRAVENOUS | Status: DC
  Administered 2017-08-01 (×3): 10 meq via INTRAVENOUS
  Filled 2017-08-01 (×3): qty 50

## 2017-08-01 MED ORDER — PANTOPRAZOLE SODIUM 40 MG IV SOLR
40.0000 mg | Freq: Two times a day (BID) | INTRAVENOUS | Status: DC
  Administered 2017-08-01 (×2): 40 mg via INTRAVENOUS
  Filled 2017-08-01 (×4): qty 40

## 2017-08-01 MED ORDER — MIDAZOLAM HCL 2 MG/2ML IJ SOLN
2.0000 mg | INTRAMUSCULAR | Status: DC | PRN
  Administered 2017-08-01 (×2): 2 mg via INTRAVENOUS
  Filled 2017-08-01: qty 2

## 2017-08-01 MED ORDER — FENTANYL 2500MCG IN NS 250ML (10MCG/ML) PREMIX INFUSION: 0.0000 ug/h | INTRAVENOUS | Status: DC

## 2017-08-01 MED ORDER — DEXMEDETOMIDINE HCL IN NACL 200 MCG/50ML IV SOLN
0.4000 ug/kg/h | INTRAVENOUS | Status: DC
  Administered 2017-08-01: 0.5 ug/kg/h via INTRAVENOUS
  Administered 2017-08-01 – 2017-08-02 (×2): 0.4 ug/kg/h via INTRAVENOUS
  Filled 2017-08-01 (×3): qty 50

## 2017-08-01 MED ORDER — ASPIRIN 81 MG PO CHEW
81.0000 mg | CHEWABLE_TABLET | Freq: Every day | ORAL | Status: DC
  Administered 2017-08-01: 81 mg
  Filled 2017-08-01: qty 1

## 2017-08-01 MED FILL — Medication: Qty: 1 | Status: AC

## 2017-08-01 NOTE — Progress Notes (Signed)
Critical troponin 21.89. Sharyn Lull, MD paged, awaiting phone call back.   Sood, MD made aware of abg results and Critical Lactic acid of 2.0 and critical low ionized calcium of 0.88. See new orders. Will continue to monitor acutely.   Sood MD made aware of high [place]  pad temperatures and difficulty in rewarming. Warm blankets applied and room temperature increased. Will continue to monitor closely.   Delories Heinz, MD

## 2017-08-01 NOTE — Progress Notes (Signed)
CXR on my read looks good. ETT and CVL in good position. No pneumothorax. No infiltrates or effusions. Discussed new events with Dr Sharyn Lull who recommends adding Lidocaine infusion but also continuing Amiodarone infusion. I have placed those orders. Dr Sharyn Lull plans to call to get patient and AICD placed. Have signed this patient out to day team PCCM APP.

## 2017-08-01 NOTE — Consult Note (Addendum)
Cardiology Consultation:   Patient ID: Lee Moore; [id number]; 12-31-1975   Admit date: 07/30/2017 Date of Consult: 08/01/2017  Primary Care Provider: Patient, No Pcp Per Primary Cardiologist: Dr. Terrence Dupont (new this admission)   Patient Profile:   Lee Moore is a 42 y.o. male with a hx of CAD w/prior IWMI 2017 (reportedly 5 stents were placed then), HTN, HLD who is being seen today for the evaluation of VF arrest at the request of Dr. Terrence Dupont.  History of Present Illness:   Lee Moore to [facility] 07/30/17, he was at a friend's hose when he suddenly collapsed.  EMS was called, found in VF, required multiple defibrillations, intubated in the field, CPR, by record ROSC was obtained in 10-15 minutes >>  A. fib with RVR with Q waves in inferior leads with ST elevation and reciprocal ST depression in high lateral leads, he required a number of shocks in the ER as well for recurrent VT.   code STEMI was called and he was taken emergently to the cath lab, hypothermia protocol was initiated as well, requiring pressor support, and was on amiodarone gtt after required a shock for VT at the onset of his cath procedure.  This morning he suffered another VF arrest, had approximately 6 minutes of CPR, one shock/ACLS.  He was already on amiodarone gtt, lidocaine gtt was added.  GTTS: Amiodarone gtt (since admission) lidocaine gtt Norepinephrine gtt (since admmissioni) Nimbex gtt Fentanyl gtt Versed gtt  Getting K+ runs  He remains intubated, and is in re-warming process He is sedated and intubated  LABS today K+ 3.8 Mag 2.0 BUN/Creat 21/1.85 AST 219 ALT 335 WBC 9.2 H/H 11/33 Plts 225   Past Medical History:  Diagnosis Date  . MI (myocardial infarction) [facility])     Past Surgical History:  Procedure Laterality Date  . CORONARY/GRAFT ACUTE MI REVASCULARIZATION N/A 07/30/2017   Procedure: Coronary/Graft Acute MI Revascularization;  Surgeon: Charolette Forward, MD;  Location: [place] [facility];  Service: Cardiovascular;  Laterality: N/A;  . LEFT HEART CATH AND CORONARY ANGIOGRAPHY N/A 07/30/2017   Procedure: LEFT HEART CATH AND CORONARY ANGIOGRAPHY;  Surgeon: Charolette Forward, MD;  Location: [place] [facility];  Service: Cardiovascular;  Laterality: N/A;       Inpatient Medications: Scheduled Meds: . artificial tears  1 application Both Eyes Y9W  . aspirin  81 mg Per Tube Daily  . atorvastatin  80 mg Per Tube q1800  . chlorhexidine gluconate (MEDLINE KIT)  15 mL Mouth Rinse BID  . Chlorhexidine Gluconate Cloth  6 each Topical Daily  . docusate  100 mg Per Tube BID  . enoxaparin (LOVENOX) injection  40 mg Subcutaneous Q24H  . insulin aspart  1-3 Units Subcutaneous Q4H  . mouth rinse  15 mL Mouth Rinse 10 times per day  . pantoprazole  40 mg Intravenous Q12H  . sennosides  5 mL Per Tube QHS  . sodium chloride flush  10-40 mL Intracatheter Q12H  . sodium chloride flush  3 mL Intravenous Q12H  . ticagrelor  90 mg Per Tube BID   Continuous Infusions: . sodium chloride Stopped (07/30/17 0000)  . amiodarone 30 mg/hr (08/01/17 1046)  . calcium gluconate    . cisatracurium (NIMBEX) infusion 1.5 mcg/kg/min (08/01/17 0600)  . epinephrine Stopped (07/31/17 0136)  . fentaNYL infusion INTRAVENOUS 300 mcg/hr (08/01/17 1027)  . lidocaine 1 mg/min (08/01/17 0944)  . midazolam (VERSED) infusion 6 mg/hr (08/01/17 1050)  . norepinephrine (LEVOPHED) Adult infusion 35 mcg/min (08/01/17  1050)  . piperacillin-tazobactam (ZOSYN)  IV Stopped (08/01/17 0846)   PRN Meds: acetaminophen (TYLENOL) oral liquid 160 mg/5 mL, albuterol, [COMPLETED] cisatracurium **AND** cisatracurium (NIMBEX) infusion **AND** cisatracurium, fentaNYL, midazolam, ondansetron (ZOFRAN) IV, sodium chloride flush, sodium chloride flush  Allergies:   Not on File  Social History:   Social History   Socioeconomic History  . Marital status: Married    Spouse name: Not on file  . Number of children: Not on  file  . Years of education: Not on file  . Highest education level: Not on file  Occupational History  . Not on file  Social Needs  . Financial resource strain: Not on file  . Food insecurity:    Worry: Not on file    Inability: Not on file  . Transportation needs:    Medical: Not on file    Non-medical: Not on file  Tobacco Use  . Smoking status: Unknown If Ever Smoked  Substance and Sexual Activity  . Alcohol use: Not Currently    Comment: unknown  . Drug use: Not on file  . Sexual activity: Not on file  Lifestyle  . Physical activity:    Days per week: Not on file    Minutes per session: Not on file  . Stress: Not on file  Relationships  . Social connections:    Talks on phone: Not on file    Gets together: Not on file    Attends religious service: Not on file    Active member of club or organization: Not on file    Attends meetings of clubs or organizations: Not on file    Relationship status: Not on file  . Intimate partner violence:    Fear of current or ex partner: Not on file    Emotionally abused: Not on file    Physically abused: Not on file    Forced sexual activity: Not on file  Other Topics Concern  . Not on file  Social History Narrative  . Not on file    Family History:   Unable to obtain given patient's condition, no family at bedside  ROS:  Please see the history of present illness.  All other ROS reviewed and negative.     Physical Exam/Data:   Vitals:   08/01/17 1000 08/01/17 1015 08/01/17 1030 08/01/17 1045  BP: 104/86 102/60 101/84 101/79  Pulse:      Resp: (!) 24 (!) 24 (!) 24 (!) 24  Temp: (!) 90.7 F (32.6 C) (!) 90.9 F (32.7 C) (!) 91.8 F (33.2 C) (!) 92.5 F (33.6 C)  TempSrc: Core     SpO2: 100%     Weight:      Height:        Intake/Output Summary (Last 24 hours) at 08/01/2017 1101 Last data filed at 08/01/2017 1023 Gross per 24 hour  Intake 3204.15 ml  Output 955 ml  Net 2249.15 ml   Filed Weights   07/30/17 1934  07/31/17 0410  Weight: 225 lb (102.1 kg) 228 lb 2.5 oz (103.5 kg)   Body mass index is 30.94 kg/m.  General:  Well nourished, well developed, intubated and sedated HEENT: not assessed, ET tube in place Lymph: no adenopathy Neck: 7 cm JVD Endocrine:  Not assessed Vascular: No carotid bruits  Cardiac: RRR; no murmurs, gallops or rubs appreciated Lungs:  CTA b/l, no wheezing, rhonchi or rales, on ventilatory support Abd: soft Ext: no edema Musculoskeletal:  No deformities  Skin: warm and dry  Neuro:  Unable to assess Psych:  Unable to assess   EKG:  The EKG was personally reviewed and demonstrates:   Today is SR, unusual P wave, QTc 529m Telemetry:  Telemetry was personally reviewed and demonstrates:   SR currently  Relevant CV Studies:  07/31/17: TTE Study Conclusions - Left ventricle: The cavity size was normal. Systolic function was   moderately to severely reduced. The estimated ejection fraction   was in the range of 30% to 35%. Severe hypokinesis of the   basal-midinferior myocardium. - Atrial septum: No defect or patent foramen ovale was identified.  07/30/17: (19:49) LHC/PCI  Ost LM lesion is 20% stenosed.  Ost LAD to Prox LAD lesion is 20% stenosed.  Prox RCA to Mid RCA lesion is 100% stenosed.  Dist RCA lesion is 100% stenosed.  Post Atrio lesion is 100% stenosed.  Ost RPDA to RPDA lesion is 100% stenosed.  Post intervention, there is a 0% residual stenosis.  A drug-eluting stent was successfully placed using a STENT SIERRA 3.50 X 38 MM.  Laboratory Data:  Chemistry Recent Labs  Lab 07/31/17 0255  07/31/17 0515  08/01/17 [id number]/06/19 0407 08/01/17 0640 08/01/17 0933  NA 136   < > 136   < > 137 140 140 140  K 4.7   < > 4.0   < > 3.8 3.8 3.5 4.0  CL 106   < > 104   < > 109 107 108 108  CO2 20*  --  18*  --  18*  --   --   --   GLUCOSE 146*   < > 184*   < > 125* 131* 198* 162*  BUN 16   < > 17   < > 21* 19 23* 22*  CREATININE 2.17*   < > 2.13*    < > 1.85* 1.70* 1.90* 2.00*  CALCIUM 7.3*  --  7.3*  --  6.7*  --   --   --   GFRNONAA 36*  --  37*  --  43*  --   --   --   GFRAA 41*  --  42*  --  50*  --   --   --   ANIONGAP 10  --  14  --  10  --   --   --    < > = values in this interval not displayed.    Recent Labs  Lab 07/30/17 1935 07/31/17 0515 08/01/17 0356  PROT 6.8 5.9* 5.3*  ALBUMIN 3.8 3.3* 2.6*  AST 564* 625* 219*  ALT 618* 546* 335*  ALKPHOS 78 56 46  BILITOT 0.6 0.6 0.6   Hematology Recent Labs  Lab 07/30/17 1935  07/31/17 0515  08/01/17 0356 08/01/17 0407 08/01/17 0640 08/01/17 0933  WBC 11.3*  --  11.9*  --  9.2  --   --   --   RBC 4.84  --  4.45  --  3.90*  --   --   --   HGB 15.3   < > 13.6   < > 11.7* 10.9* 11.9* 11.2*  HCT 45.5   < > 39.9   < > 33.9* 32.0* 35.0* 33.0*  MCV 94.0  --  89.7  --  86.9  --   --   --   MCH 31.6  --  30.6  --  30.0  --   --   --   MCHC 33.6  --  34.1  --  34.5  --   --   --  RDW 13.6  --  13.6  --  13.7  --   --   --   PLT 236  --  295  --  225  --   --   --    < > = values in this interval not displayed.   Cardiac Enzymes Recent Labs  Lab 07/30/17 1935 07/31/17 0110 07/31/17 0515 07/31/17 1058 07/31/17 1626 08/01/17 0905  TROPONINI 11.07* 48.80* 55.83* 17.47* 15.02* 21.89*    Recent Labs  Lab 07/30/17 1938  TROPIPOC 11.86*    BNPNo results for input(s): BNP, PROBNP in the last 168 hours.  DDimer No results for input(s): DDIMER in the last 168 hours.  Radiology/Studies:   Ct Head Wo Contrast Result Date: 07/31/2017 CLINICAL DATA:  42 year old male with history of cardiac arrest. EXAM: CT HEAD WITHOUT CONTRAST TECHNIQUE: Contiguous axial images were obtained from the base of the skull through the vertex without intravenous contrast. COMPARISON:  None. FINDINGS: Brain: No evidence of acute infarction, hemorrhage, hydrocephalus, extra-axial collection or mass lesion/mass effect. Vascular: No hyperdense vessel or unexpected calcification. Skull: Normal.  Negative for fracture or focal lesion. Sinuses/Orbits: Mild multifocal mucosal thickening throughout the paranasal sinuses. No acute finding. Other: Support tubes noted in the oral cavity, incompletely imaged. IMPRESSION: 1. No acute intracranial abnormalities. Electronically Signed   By: Vinnie Langton M.D.   On: 07/31/2017 14:52      Dg Chest Port 1 View Result Date: 08/01/2017 CLINICAL DATA:  Cardiac arrest, ETT EXAM: PORTABLE CHEST 1 VIEW COMPARISON:  08/01/2017 at 0520 hours FINDINGS: Endotracheal tube terminates 2.5 cm above the carina. Mild patchy/hazy right perihilar and upper lobe opacity, possibly reflecting aspiration. No pleural effusion or pneumothorax. Heart is normal in size. Right IJ venous catheter terminates the cavoatrial junction. Enteric tube terminates in the gastric cardia. Defibrillator pads overlying the left hemithorax. IMPRESSION: Endotracheal tube terminates 2.5 cm above the carina. Mild patchy right lung opacities, possibly reflecting aspiration. Additional support apparatus as above. Electronically Signed   By: Julian Hy M.D.   On: 08/01/2017 07:21      Assessment and Plan:   1. Recurrent  Arrest     <48 hours post STEMI/intervention  In review Dr. Zenia Resides note, following enzymes, planned for medical management, no planned for re-cath pending enzymes He noted that the patient had large thrombus burden and proximal and mid RCA requiring multiple balloon inflations.  Aspiration thrombectomy followed by stenting with improvement in blood flow TIMI 2 to the RV branch .  This vessel is not suitable for further PCI , even if it is occluded  Agree with lidocaine gtt with amiodarone.  Continue supportive care, electrolyte replacement as indicated with CCM, Dr. Terrence Dupont  Pending clinical course and recovery, he may be candidate for ICD.  Dr. Lovena Le to see.     For questions or updates, please contact [place] Please consult [email or web address] for contact  info under Cardiology/STEMI.   Signed, Baldwin Jamaica, PA-C  08/01/2017 11:01 AM  EP attending  Patient seen and examined.  Agree with the findings as noted above the patient is an unfortunate 42 year old man with an acute myocardial infarction involving the right coronary artery complicated by ventricular fibrillation, and prolonged downtime.  He has been intubated, cooled, and attempts at revascularization are noted above.  He appears to have or be in the process of regaining neurologic function.  Earlier today, less than 48 hours from his initial event, he had recurrent ventricular fibrillation.  His cardiac troponins which  have been trending downward have bumped up although his EKG demonstrates no evidence of acute ischemia or infarction.  The patient was initially treated with intravenous amiodarone, and intravenous lidocaine has been added.  On exam he remains intubated and sedated.  His lungs are clear.  Cardiovascular exam reveals a regular rate and rhythm.  Abdominal exam is soft nontender.  He moves all of his extremities.  EKG is as noted above. Assessment and plan 1.  Recurrent ventricular fibrillation -the patient's coronary anatomy has been reviewed and he is at risk for recurrent ventricular fibrillation.  As he is in the early stages after an acute MI, I would recommend continued intravenous antiarrhythmic drug therapy, watching for lidocaine toxicity.  More than likely he will require a defibrillator, although a LifeVest might be most prudent in the short-term at the time of discharge.  If he has recurrent sustained ventricular arrhythmias now that he is over 48 hours from his index event, ICD during his admission would be a consideration although still not ideal because of his underlying medical regimen. 2. Acute myocardial infarction - the patient has sustained an inferior MI complicated by ventricular fibrillation. The cath report suggests that additional PCI not an option due to poor  reflow and heavy clot burden. He will be maintained on anti-platelet therapy, beta blocker therapy. Afterload reduction as an outpatient and possibly aldactone.  3. Acute on chronic stage 3 renal failure - the patient's creatinine has increased in the setting of his arrest. We will follow. His lidocaine dosing will need to be watched carefully as he is at risk for toxicity.  Mikle Bosworth.D.

## 2017-08-01 NOTE — Progress Notes (Signed)
Wasted 125 ml of fentanyl and 25 ml's of versed in sink with Hale Drone, RN.   Delories Heinz, RN

## 2017-08-01 NOTE — Progress Notes (Signed)
PCCM Progress Note  Admission date: 07/30/2017 Referring provider: Dr. Dalene Seltzer, ER CC: Cardiac arrest  HPI: 42 yo male had VF cardiac arrest at home. PMHx of HTN, CAD, HLD.  Subjective: Had VF arrest this AM.  Getting rewarmed.  Vital signs: BP 102/60   Pulse (!) 58   Temp (!) 92.1 F (33.4 C)   Resp (!) 24   Ht 6' (1.829 m)   Wt 228 lb 2.5 oz (103.5 kg)   SpO2 96%   BMI 30.94 kg/m   Intake/outpt: I/O last 3 completed shifts: In: 4676.2 [I.V.:4076.2; IV Piggyback:600] Out: 1475 [Urine:725; Emesis/NG output:750]  Physical exam:  General - sedated/paralyzed Eyes - pupils pinpoint ENT - ETT in place Cardiac - regular, no murmur Chest - no wheeze, rales Abd - soft, non tender Ext - no edema Skin - no rashes Neuro - RASS -5   CBC Recent Labs    07/30/17 1935  07/31/17 0515  08/01/17 0356 08/01/17 0407 08/01/17 0640  WBC 11.3*  --  11.9*  --  9.2  --   --   HGB 15.3   < > 13.6   < > 11.7* 10.9* 11.9*  HCT 45.5   < > 39.9   < > 33.9* 32.0* 35.0*  PLT 236  --  295  --  225  --   --    < > = values in this interval not displayed.    Coag's Recent Labs    07/30/17 1935 07/31/17 0110 07/31/17 1058 08/01/17 0624  APTT 33 69* 40*  --   INR 1.17 2.23  --  1.40    BMET Recent Labs    07/31/17 0255  07/31/17 0515  08/01/17 0356 08/01/17 0407 08/01/17 0640  NA 136   < > 136   < > 137 140 140  K 4.7   < > 4.0   < > 3.8 3.8 3.5  CL 106   < > 104   < > 109 107 108  CO2 20*  --  18*  --  18*  --   --   BUN 16   < > 17   < > 21* 19 23*  CREATININE 2.17*   < > 2.13*   < > 1.85* 1.70* 1.90*  GLUCOSE 146*   < > 184*   < > 125* 131* 198*   < > = values in this interval not displayed.    Electrolytes Recent Labs    07/30/17 2208  07/31/17 0255 07/31/17 0515 07/31/17 2308 08/01/17 0356  CALCIUM  --    < > 7.3* 7.3*  --  6.7*  MG 2.0  --   --  1.9 2.0 2.0  PHOS 7.5*  --   --  1.8*  --  3.0   < > = values in this interval not displayed.     Sepsis Markers Recent Labs    07/31/17 0110 08/01/17 0356  PROCALCITON 2.59 5.04    ABG Recent Labs    08/01/17 0420 08/01/17 0626 08/01/17 0633  PHART 7.498* 7.325* 7.338*  PCO2ART 23.8* 33.4 30.2*  PO2ART 159* 97.0 87.0    Liver Enzymes Recent Labs    07/30/17 1935 07/31/17 0515 08/01/17 0356  AST 564* 625* 219*  ALT 618* 546* 335*  ALKPHOS 78 56 46  BILITOT 0.6 0.6 0.6  ALBUMIN 3.8 3.3* 2.6*    Cardiac Enzymes Recent Labs    07/31/17 0515 07/31/17 1058 07/31/17 1626  TROPONINI 55.83* 17.47* 15.02*  Glucose Recent Labs    07/31/17 1147 07/31/17 1451 07/31/17 1814 07/31/17 2010 07/31/17 2339 08/01/17 0404  GLUCAP 151* 137* 129* 132* 162* 129*    Imaging Ct Head Wo Contrast  Result Date: 07/31/2017 CLINICAL DATA:  42 year old male with history of cardiac arrest. EXAM: CT HEAD WITHOUT CONTRAST TECHNIQUE: Contiguous axial images were obtained from the base of the skull through the vertex without intravenous contrast. COMPARISON:  None. FINDINGS: Brain: No evidence of acute infarction, hemorrhage, hydrocephalus, extra-axial collection or mass lesion/mass effect. Vascular: No hyperdense vessel or unexpected calcification. Skull: Normal. Negative for fracture or focal lesion. Sinuses/Orbits: Mild multifocal mucosal thickening throughout the paranasal sinuses. No acute finding. Other: Support tubes noted in the oral cavity, incompletely imaged. IMPRESSION: 1. No acute intracranial abnormalities. Electronically Signed   By: Trudie Reed M.D.   On: 07/31/2017 14:52   Dg Chest Port 1 View  Result Date: 08/01/2017 CLINICAL DATA:  ETT, respiratory failure EXAM: PORTABLE CHEST 1 VIEW COMPARISON:  07/31/2017 at 1208 hours FINDINGS: Endotracheal tube terminates 2.5 cm above the carina. Right IJ venous catheter terminates in the upper right atrium, 2.5 cm below the cavoatrial junction. Enteric tube terminates in the gastric cardia. Defibrillator pads overlying  the left hemithorax. Mild patchy medial right lower lobe opacity, likely atelectasis. No pleural effusion or pneumothorax. IMPRESSION: Endotracheal tube terminates 2.5 cm above the carina. Additional support apparatus as above. Electronically Signed   By: Charline Bills M.D.   On: 08/01/2017 07:22   Dg Chest Port 1 View  Result Date: 08/01/2017 CLINICAL DATA:  Cardiac arrest, ETT EXAM: PORTABLE CHEST 1 VIEW COMPARISON:  08/01/2017 at 0520 hours FINDINGS: Endotracheal tube terminates 2.5 cm above the carina. Mild patchy/hazy right perihilar and upper lobe opacity, possibly reflecting aspiration. No pleural effusion or pneumothorax. Heart is normal in size. Right IJ venous catheter terminates the cavoatrial junction. Enteric tube terminates in the gastric cardia. Defibrillator pads overlying the left hemithorax. IMPRESSION: Endotracheal tube terminates 2.5 cm above the carina. Mild patchy right lung opacities, possibly reflecting aspiration. Additional support apparatus as above. Electronically Signed   By: Charline Bills M.D.   On: 08/01/2017 07:21   Dg Chest Port 1 View  Result Date: 07/31/2017 CLINICAL DATA:  42 y/o  M; endotracheal tube, central line, OG tube. EXAM: PORTABLE CHEST 1 VIEW COMPARISON:  None. FINDINGS: Endotracheal tube tip 2 cm above the carina. Enteric tube tip below field of view and abdomen. Right central venous catheter tip projects over right cavoatrial junction. Transcutaneous pacing pads noted. Hazy opacities in the lungs. No pleural effusion or pneumothorax. Bones are unremarkable. IMPRESSION: Hazy opacities of the lungs probably represents vascular congestion. Endotracheal tube, enteric tube, and right central venous catheter in satisfactory position. Electronically Signed   By: Mitzi Hansen M.D.   On: 07/31/2017 00:33    Studies: LHC 5/04 Echo 5/05 >> EF 30 to 35% CT head 5/05 >> no acute findings  Cultures: Sputum 5/05 >>   Antibiotics: Zosyn 5/04  >>  Events: 5/06 VF/PEA/Asystolic with ROSC in 6 minutes  Lines/tubes: ETT 5/04 >>  Rt IJ CVL 5/05 >>  Discussion: 42 yo with VF arrest with STEMI with hx of CAD, HTN, HLD.  Had recurrent cardiac arrest this AM.  Assessment/plan:  Acute hypoxic respiratory failure. - full vent support  VF cardiac arrest. Cardiogenic shock. STEMI with CAD. Acute systolic CHF. HLD. - pressors to keep MAP > 65 - amiodarone per cardiology - ASA, lipitor, brilinta  Aspiration pneumonia. -  day 3 of ABx  Acute anoxic/metabolic encephalopathy. - rewarming started 5/06  Hypokalemia. - f/u BMET  Hyperglycemia. - SSI  DVT prophylaxis - lovenox SUP - protonix Nutrition - Goals of care - full code  CC time 32 minutes  Coralyn Helling, MD [facility] Pulmonary/Critical Care 08/01/2017, 7:59 AM

## 2017-08-01 NOTE — Progress Notes (Signed)
eLink Physician-Brief Progress Note Patient Name: Lee Moore DOB: 1975-07-30 MRN: [id number]   Date of Service  08/01/2017  HPI/Events of Note  Agitation   eICU Interventions  Will order: 1. Increase frequency of Versed dose to Q 1 hour PRN.      Intervention Category Minor Interventions: Agitation / anxiety - evaluation and management  Sommer,Steven Dennard Nip 08/01/2017, 7:31 PM

## 2017-08-01 NOTE — Progress Notes (Signed)
Called Dr. Arsenio Loader to notify him of hypotension related to Precedex and Versed as well as poor urine output closer to beginning of shift. Ordered to utilize Levophed, minimize sedation, and monitor urine output. Will continue to monitor.

## 2017-08-01 NOTE — Code Documentation (Signed)
CODE BLUE NOTE  Patient Name: Lee Moore   MRN: [id number]   Date of Birth/ Sex: 1975/11/06 , male      Admission Date: 07/30/2017  Attending Provider: Gigi Gin, MD  Primary Diagnosis: <principal problem not specified>    Indication: Pt was noticed to be in Vtach on monitor, shortly after initiating hyperthermia protocol. Pt was found to be pulseless in the room. Code blue was subsequently called. At the time of arrival on scene, ACLS protocol was underway.    Technical Description:  - CPR performance duration:  6 minutes  - Was defibrillation or cardioversion used? Yes   - Was external pacer placed? No  - Was patient intubated pre/post CPR? Intubated pre-crp    Medications Administered: Y = Yes; Blank = No Amiodarone    Atropine    Calcium    Epinephrine  y  Lidocaine    Magnesium    Norepinephrine    Phenylephrine    Sodium bicarbonate    Vasopressin      Post CPR evaluation:  - Final Status - Was patient successfully resuscitated ? Yes - What is current rhythm? sinus - What is current hemodynamic status?  100/60   Miscellaneous Information:  - Labs sent, including: none  - Primary team notified?  Yes  - Family Notified? Yes  - Additional notes/ transfer status:  already in CCU        Garnette Gunner, MD  08/01/2017, 6:18 AM

## 2017-08-01 NOTE — Progress Notes (Signed)
Subjective:  Patient remains intubated, sedated on pressors.  An episode of V. Fib/asystolic cardiac arrest requiring defibrillation and CPR for 6  minutes .  EKG done showed no new acute ischemic changes.  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  Had large thrombus burden and proximal and mid RCA requiring multiple balloon inflations.  Aspiration thrombectomy followed by stenting with improvement in blood flow TIMI 2 to the RV branch .  This vessel is not suitable for further PCI , even if it is occluded.   Objective:  Vital Signs in the last 24 hours: Temp:  [88.7 F (31.5 C)-94.5 F (34.7 C)] 90.5 F (32.5 C) (05/06 0900) Pulse Rate:  [49-69] 57 (05/06 0800) Resp:  [12-24] 24 (05/06 0900) BP: (87-124)/(60-108) 117/87 (05/06 0900) SpO2:  [94 %-100 %] 99 % (05/06 0900) Arterial Line BP: (91-147)/(57-99) 131/81 (05/06 0900) FiO2 (%):  [40 %-100 %] 100 % (05/06 0900)  Intake/Output from previous day: 05/05 0701 - 05/06 0700 In: 3142.1 [I.V.:2592.1; IV Piggyback:550] Out: 895 [Urine:345; Emesis/NG output:550] Intake/Output from this shift: No intake/output data recorded.  Physical Exam: Neck: no adenopathy, no carotid bruit, no JVD and supple, symmetrical, trachea midline Lungs: clear to auscultation anterolaterally Heart: bradycardic, S1, S2, soft systolic murmur noted Abdomen: soft, non-tender; bowel sounds normal; no masses,  no organomegaly Extremities: extremities normal, atraumatic, no cyanosis or edema  Lab Results: Recent Labs    07/31/17 0515  08/01/17 0356 08/01/17 0407 08/01/17 0640  WBC 11.9*  --  9.2  --   --   HGB 13.6   < > 11.7* 10.9* 11.9*  PLT 295  --  225  --   --    < > = values in this interval not displayed.   Recent Labs    07/31/17 0515  08/01/17 0356 08/01/17 0407 08/01/17 0640  NA 136   < >  137 140 140  K 4.0   < > 3.8 3.8 3.5  CL 104   < > 109 107 108  CO2 18*  --  18*  --   --   GLUCOSE 184*   < > 125* 131* 198*  BUN 17   < > 21* 19 23*  CREATININE 2.13*   < > 1.85* 1.70* 1.90*   < > = values in this interval not displayed.   Recent Labs    07/31/17 1058 07/31/17 1626  TROPONINI 17.47* 15.02*   Hepatic Function Panel Recent Labs    07/31/17 0515 08/01/17 0356  PROT 5.9* 5.3*  ALBUMIN 3.3* 2.6*  AST 625* 219*  ALT 546* 335*  ALKPHOS 56 46  BILITOT 0.6 0.6  BILIDIR 0.2  --   IBILI 0.4  --    Recent Labs    07/30/17 2005  CHOL 133   No results for input(s): PROTIME in the last 72 hours.  Imaging: Imaging results have been reviewed and Ct Head Wo Contrast  Result Date: 07/31/2017 CLINICAL DATA:  42 year old male with history of cardiac arrest. EXAM: CT HEAD WITHOUT CONTRAST TECHNIQUE: Contiguous axial images were obtained from the base of the skull through the vertex without intravenous contrast. COMPARISON:  None. FINDINGS: Brain: No evidence of acute infarction, hemorrhage, hydrocephalus, extra-axial collection or mass lesion/mass effect. Vascular: No hyperdense vessel or unexpected calcification. Skull: Normal. Negative for fracture or focal lesion. Sinuses/Orbits:  Mild multifocal mucosal thickening throughout the paranasal sinuses. No acute finding. Other: Support tubes noted in the oral cavity, incompletely imaged. IMPRESSION: 1. No acute intracranial abnormalities. Electronically Signed   By: Trudie Reed M.D.   On: 07/31/2017 14:52   Dg Chest Port 1 View  Result Date: 08/01/2017 CLINICAL DATA:  ETT, respiratory failure EXAM: PORTABLE CHEST 1 VIEW COMPARISON:  07/31/2017 at 1208 hours FINDINGS: Endotracheal tube terminates 2.5 cm above the carina. Right IJ venous catheter terminates in the upper right atrium, 2.5 cm below the cavoatrial junction. Enteric tube terminates in the gastric cardia. Defibrillator pads overlying the left hemithorax. Mild patchy  medial right lower lobe opacity, likely atelectasis. No pleural effusion or pneumothorax. IMPRESSION: Endotracheal tube terminates 2.5 cm above the carina. Additional support apparatus as above. Electronically Signed   By: Charline Bills M.D.   On: 08/01/2017 07:22   Dg Chest Port 1 View  Result Date: 08/01/2017 CLINICAL DATA:  Cardiac arrest, ETT EXAM: PORTABLE CHEST 1 VIEW COMPARISON:  08/01/2017 at 0520 hours FINDINGS: Endotracheal tube terminates 2.5 cm above the carina. Mild patchy/hazy right perihilar and upper lobe opacity, possibly reflecting aspiration. No pleural effusion or pneumothorax. Heart is normal in size. Right IJ venous catheter terminates the cavoatrial junction. Enteric tube terminates in the gastric cardia. Defibrillator pads overlying the left hemithorax. IMPRESSION: Endotracheal tube terminates 2.5 cm above the carina. Mild patchy right lung opacities, possibly reflecting aspiration. Additional support apparatus as above. Electronically Signed   By: Charline Bills M.D.   On: 08/01/2017 07:21   Dg Chest Port 1 View  Result Date: 07/31/2017 CLINICAL DATA:  42 y/o  M; endotracheal tube, central line, OG tube. EXAM: PORTABLE CHEST 1 VIEW COMPARISON:  None. FINDINGS: Endotracheal tube tip 2 cm above the carina. Enteric tube tip below field of view and abdomen. Right central venous catheter tip projects over right cavoatrial junction. Transcutaneous pacing pads noted. Hazy opacities in the lungs. No pleural effusion or pneumothorax. Bones are unremarkable. IMPRESSION: Hazy opacities of the lungs probably represents vascular congestion. Endotracheal tube, enteric tube, and right central venous catheter in satisfactory position. Electronically Signed   By: Mitzi Hansen M.D.   On: 07/31/2017 00:33    Cardiac Studies:  Assessment/Plan:  Status post out of hospital V. Fib cardiac arrest. 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 Status post  recurrent V. Fib cardiac arrest. Cardiogenic shock. Acute respiratory failure secondary to above. Ischemic cardiomyopathy, history of inferior wall MI in the past, status post PCI to distal RCA, PDA and PLV branch in [place] requiring multiple stents. Hypertension. Elevated blood sugar, rule out diabetes. Acute on chronic kidney injury, multifactorial. Hypokalemia. Plan Replace K Agree with lidocaine drip. EP consult was called. Discuss with family regarding present medical management versus relook angiogram, agrees for medical management for now on unless has recurrent episodes of V. Fib arrest and markedly elevated cardiac enzymes.or new changes in the EKG.  Wean off pressors as tolerated. Rewarming and weaning as per CCM  LOS: 2 days    Rinaldo Cloud 08/01/2017, 9:11 AM

## 2017-08-01 NOTE — Progress Notes (Signed)
Nimbex turned off at 13:54 per protocol. Patient is now 36 degrees. Will continue to monitor.  Delories Heinz, RN

## 2017-08-01 NOTE — Progress Notes (Signed)
eLink Physician-Brief Progress Note Patient Name: Lee Moore DOB: 1975/07/16 MRN: [id number]   Date of Service  08/01/2017  HPI/Events of Note  ABG on 40%/PRVC 24/TV 620/P 5 = 7.498/23.8/159.  eICU Interventions  Will order: 1. Decrease PRVC rate to 17. 2. Repeat ABG at 7:30 AM.     Intervention Category Major Interventions: Acid-Base disturbance - evaluation and management;Respiratory failure - evaluation and management  Jazmine Heckman Dennard Nip 08/01/2017, 5:47 AM

## 2017-08-01 NOTE — Progress Notes (Signed)
Paged Sharyn Lull, MD with critical Troponin 28.80. No knew orders at this time, will continue to monitor.  Delories Heinz, RN

## 2017-08-01 NOTE — Progress Notes (Signed)
eLink Physician-Brief Progress Note Patient Name: Lee Moore DOB: 11-Mar-1976 MRN: [id number]   Date of Service  08/01/2017  HPI/Events of Note  Delirium - Persists in spite of Fentanyl and Versed PRN.   eICU Interventions  Will order: 1. Soft bilateral wrist restraints.  2. Precedex IV infusion. Titrate to RASS = 0 to -1.     Intervention Category Major Interventions: Delirium, psychosis, severe agitation - evaluation and management  Sommer,Steven Eugene 08/01/2017, 8:08 PM

## 2017-08-01 NOTE — Progress Notes (Signed)
   08/01/17 0700  Clinical Encounter Type  Visited With Family  Visit Type Code  Referral From Nurse  Spiritual Encounters  Spiritual Needs Emotional   Chaplain responded to Code Blue. Waited with his mom, wife and other family members and helped them get back and forth to Lee Moore room. Offered emotional support.

## 2017-08-01 NOTE — Progress Notes (Addendum)
Called emergently to patient's bedside for cardiac arrest. By the time I arrived at bedside, ROSC had just been achieved. [facility] Dr Dellie Catholic ran the code from camera. RN states patient went into VF and was shocked, then CPR continued. Then patient went into PEA then Asystole. Total downtime 6 minutes prior to ROSC being attained. Labs from this AM show no electrolyte abnormalities. K 3.8 and Mg 2.0. However recheck K on ISTAT shows K:3.5; will give KCL IV. On my exam patient is hypertensive, lungs CTA b/l, Patient sedated and paralyzed, OG tube with bright red blood. ABG with pH 7.34/ pCO2 33 / PaO2 97 / Bicarb 17. EKG shows no acute ST changes and QTc 462. Obtain stat CXR. Given the OG bleeding, order INR and Fibrinogen. Change IV Pepcid to IV PPI BID. Call cards STAT as we may want to consider changing amiodarone infusion to Lidocaine or Procainamide instead; defer to cards. They may want to go back to the cath lab as well.   Milana Obey, MD Pulmonary & Critical Care Medicine Pager: [phone]

## 2017-08-01 NOTE — Consult Note (Addendum)
Advanced Heart Failure Team Consult Note   Primary Physician: Patient, No Pcp Per PCP-Cardiologist:  No primary care provider on file.  Reason for Consultation: Cardiogenic shock/VF arrest  HPI:    Lee Moore is seen today for evaluation of Cardiogenic shock/VF arrest at the request of Dr. Terrence Dupont.   Lee Moore is a 42 y.o. male with PHM of CAD s/p inferior MI 2017, HTN, HLD.  Pt had witnessed arrest 07/30/17 at a friends house after baseball game.  EMS noted Vfib requiring multiple shocks prior to NSR. Intubated outside of the hospital with estimated ROSC of 10-15 minutes. Pt had multiple episodes of VT requiring shock in the ER. Taken emergently for PCI as below.  Per patients wife had been having chest pain for ~ 1 week after he ran out of ASA.  Pt previously had "5 stents" placed in the distal RCA, PDA, and PLV branches in [place] per his wife.  Pt placed on hypothermia protocol on admission.   Had VT in cath lab requiring cardioversion.   Am of 08/01/17 pt had repeat VF arrest requiring ACLS, shock, and approx 6 minutes of CPR prior to ROSC. Started on lidocaine gtt.  HF team consulted.   On NE at 38, Amio at 37, Lidocaine at 2. Epi ordered but not currently on.   Remains intubated and sedated. Rewarming to 33 C this am.  Last trop 15.02 07/31/17. Repeat pending this am with arrest.   Lactate 2.2 07/31/17 1140.  EEG completed 07/30/17. Results pending.  CXR this am with stable ETT and CVL. No PTX, infiltrates, or infusions.   Echo 07/31/17 LVEF 30-35%, Normal appearing RV.  LHC with angiography 07/30/17: Minimal disease left coronary system.  The RCA was occluded proximally.  The patient had DES to proximal-mid RCA, restoring flow to an acute marginal.  However, the distal RCA/PLV/PDA remained occluded.  There were collaterals to the PLV from the left.   ROS: Patient is encephalopathic and/or intubated. History has been obtained from chart review.   Home Medications Prior to  Admission medications   Not on File    Past Medical History: Past Medical History:  Diagnosis Date  . MI (myocardial infarction) [facility])     Past Surgical History: Past Surgical History:  Procedure Laterality Date  . CORONARY/GRAFT ACUTE MI REVASCULARIZATION N/A 07/30/2017   Procedure: Coronary/Graft Acute MI Revascularization;  Surgeon: Charolette Forward, MD;  Location: [place] [facility];  Service: Cardiovascular;  Laterality: N/A;  . LEFT HEART CATH AND CORONARY ANGIOGRAPHY N/A 07/30/2017   Procedure: LEFT HEART CATH AND CORONARY ANGIOGRAPHY;  Surgeon: Charolette Forward, MD;  Location: [place] [facility];  Service: Cardiovascular;  Laterality: N/A;    Family History: No family history on file.  Social History: Social History   Socioeconomic History  . Marital status: Married    Spouse name: Not on file  . Number of children: Not on file  . Years of education: Not on file  . Highest education level: Not on file  Occupational History  . Not on file  Social Needs  . Financial resource strain: Not on file  . Food insecurity:    Worry: Not on file    Inability: Not on file  . Transportation needs:    Medical: Not on file    Non-medical: Not on file  Tobacco Use  . Smoking status: Unknown If Ever Smoked  Substance and Sexual Activity  . Alcohol use: Not Currently    Comment: unknown  .  Drug use: Not on file  . Sexual activity: Not on file  Lifestyle  . Physical activity:    Days per week: Not on file    Minutes per session: Not on file  . Stress: Not on file  Relationships  . Social connections:    Talks on phone: Not on file    Gets together: Not on file    Attends religious service: Not on file    Active member of club or organization: Not on file    Attends meetings of clubs or organizations: Not on file    Relationship status: Not on file  Other Topics Concern  . Not on file  Social History Narrative  . Not on file    Allergies:  Not on File  Objective:     Vital Signs:   Temp:  [88.7 F (31.5 C)-94.5 F (34.7 C)] 92.3 F (33.5 C) (05/06 0507) Pulse Rate:  [49-69] 66 (05/06 0507) Resp:  [12-24] 24 (05/06 0507) BP: (92-124)/(65-108) 96/83 (05/06 0500) SpO2:  [94 %-100 %] 94 % (05/06 0628) Arterial Line BP: (85-147)/(63-99) 124/77 (05/06 0507) FiO2 (%):  [40 %-100 %] 100 % (05/06 0628) Last BM Date: (PTA)  Weight change: Filed Weights   07/30/17 1934 07/31/17 0410  Weight: 225 lb (102.1 kg) 228 lb 2.5 oz (103.5 kg)    Intake/Output:   Intake/Output Summary (Last 24 hours) at 08/01/2017 0749 Last data filed at 08/01/2017 0400 Gross per 24 hour  Intake 3089.05 ml  Output 760 ml  Net 2329.05 ml      Physical Exam    General: Intubated/sedated.  HEENT: + ETT. NG tube.  Neck: Supple. JVP ~8-9 cm. Carotids 2+ bilat; no bruits. No thyromegaly or nodule noted. Cor: PMI nondisplaced. RRR, No M/G/R noted Lungs: Diminished basilar sounds. Mechanical breathing sounds.  Abdomen: Soft, non-tender, non-distended, no HSM. No bruits or masses. +BS  Extremities: No cyanosis, clubbing, or rash. Trace ankle edema.  Neuro: Intubated/sedated  GU: Foley  Telemetry   Multiple runs of VT overnight, VF arrest earlier this am with shock, Personally reviewed. NSR underlying.  EKG    08/01/17 0628 NSR 62 bpm.  Labs   Basic Metabolic Panel: Recent Labs  Lab 07/30/17 1935  07/30/17 2208  07/31/17 0110  07/31/17 0255  07/31/17 0515  07/31/17 2042 07/31/17 2308 07/31/17 2339 08/01/17 0356 08/01/17 0407 08/01/17 0640  NA 139   < >  --    < > 136   < > 136   < > 136   < > 141  --  138 137 140 140  K 3.7   < >  --    < > 3.7   < > 4.7   < > 4.0   < > 3.5  --  3.7 3.8 3.8 3.5  CL 105   < >  --    < > 106   < > 106   < > 104   < > 105  --  106 109 107 108  CO2 11*  --   --   --  17*  --  20*  --  18*  --   --   --   --  18*  --   --   GLUCOSE 180*   < >  --    < > 257*   < > 146*   < > 184*   < > 129*  --  167* 125* 131* 198*  BUN 11   <  >  --    < >  16   < > 16   < > 17   < > 22*  --  18 21* 19 23*  CREATININE 1.57*   < >  --    < > 1.97*   < > 2.17*   < > 2.13*   < > 1.60*  --  1.60* 1.85* 1.70* 1.90*  CALCIUM 8.3*  --   --   --  7.0*  --  7.3*  --  7.3*  --   --   --   --  6.7*  --   --   MG  --   --  2.0  --   --   --   --   --  1.9  --   --  2.0  --  2.0  --   --   PHOS  --   --  7.5*  --   --   --   --   --  1.8*  --   --   --   --  3.0  --   --    < > = values in this interval not displayed.    Liver Function Tests: Recent Labs  Lab 07/30/17 1935 07/31/17 0515 08/01/17 0356  AST 564* 625* 219*  ALT 618* 546* 335*  ALKPHOS 78 56 46  BILITOT 0.6 0.6 0.6  PROT 6.8 5.9* 5.3*  ALBUMIN 3.8 3.3* 2.6*   No results for input(s): LIPASE, AMYLASE in the last 168 hours. No results for input(s): AMMONIA in the last 168 hours.  CBC: Recent Labs  Lab 07/30/17 1935  07/31/17 0515  07/31/17 2042 07/31/17 2339 08/01/17 0356 08/01/17 0407 08/01/17 0640  WBC 11.3*  --  11.9*  --   --   --  9.2  --   --   NEUTROABS  --   --   --   --   --   --  6.4  --   --   HGB 15.3   < > 13.6   < > 11.6* 11.6* 11.7* 10.9* 11.9*  HCT 45.5   < > 39.9   < > 34.0* 34.0* 33.9* 32.0* 35.0*  MCV 94.0  --  89.7  --   --   --  86.9  --   --   PLT 236  --  295  --   --   --  225  --   --    < > = values in this interval not displayed.    Cardiac Enzymes: Recent Labs  Lab 07/30/17 1935 07/31/17 0110 07/31/17 0515 07/31/17 1058 07/31/17 1626  TROPONINI 11.07* 48.80* 55.83* 17.47* 15.02*    BNP: BNP (last 3 results) No results for input(s): BNP in the last 8760 hours.  ProBNP (last 3 results) No results for input(s): PROBNP in the last 8760 hours.   CBG: Recent Labs  Lab 07/31/17 1451 07/31/17 1814 07/31/17 2010 07/31/17 2339 08/01/17 0404  GLUCAP 137* 129* 132* 162* 129*    Coagulation Studies: Recent Labs    07/30/17 1935 07/31/17 0110 08/01/17 0624  LABPROT 14.8 24.5* 17.0*  INR 1.17 2.23 1.40      Imaging   Ct Head Wo Contrast  Result Date: 07/31/2017 CLINICAL DATA:  42 year old male with history of cardiac arrest. EXAM: CT HEAD WITHOUT CONTRAST TECHNIQUE: Contiguous axial images were obtained from the base of the skull through the vertex without intravenous contrast. COMPARISON:  None. FINDINGS: Brain: No evidence of acute infarction, hemorrhage, hydrocephalus, extra-axial  collection or mass lesion/mass effect. Vascular: No hyperdense vessel or unexpected calcification. Skull: Normal. Negative for fracture or focal lesion. Sinuses/Orbits: Mild multifocal mucosal thickening throughout the paranasal sinuses. No acute finding. Other: Support tubes noted in the oral cavity, incompletely imaged. IMPRESSION: 1. No acute intracranial abnormalities. Electronically Signed   By: Vinnie Langton M.D.   On: 07/31/2017 14:52   Dg Chest Port 1 View  Result Date: 08/01/2017 CLINICAL DATA:  ETT, respiratory failure EXAM: PORTABLE CHEST 1 VIEW COMPARISON:  07/31/2017 at 1208 hours FINDINGS: Endotracheal tube terminates 2.5 cm above the carina. Right IJ venous catheter terminates in the upper right atrium, 2.5 cm below the cavoatrial junction. Enteric tube terminates in the gastric cardia. Defibrillator pads overlying the left hemithorax. Mild patchy medial right lower lobe opacity, likely atelectasis. No pleural effusion or pneumothorax. IMPRESSION: Endotracheal tube terminates 2.5 cm above the carina. Additional support apparatus as above. Electronically Signed   By: Julian Hy M.D.   On: 08/01/2017 07:22   Dg Chest Port 1 View  Result Date: 08/01/2017 CLINICAL DATA:  Cardiac arrest, ETT EXAM: PORTABLE CHEST 1 VIEW COMPARISON:  08/01/2017 at 0520 hours FINDINGS: Endotracheal tube terminates 2.5 cm above the carina. Mild patchy/hazy right perihilar and upper lobe opacity, possibly reflecting aspiration. No pleural effusion or pneumothorax. Heart is normal in size. Right IJ venous catheter terminates  the cavoatrial junction. Enteric tube terminates in the gastric cardia. Defibrillator pads overlying the left hemithorax. IMPRESSION: Endotracheal tube terminates 2.5 cm above the carina. Mild patchy right lung opacities, possibly reflecting aspiration. Additional support apparatus as above. Electronically Signed   By: Julian Hy M.D.   On: 08/01/2017 07:21      Medications:     Current Medications: . artificial tears  1 application Both Eyes R1M  . aspirin  81 mg Oral Daily  . atorvastatin  80 mg Oral q1800  . chlorhexidine gluconate (MEDLINE KIT)  15 mL Mouth Rinse BID  . Chlorhexidine Gluconate Cloth  6 each Topical Daily  . docusate  100 mg Per Tube BID  . enoxaparin (LOVENOX) injection  40 mg Subcutaneous Q24H  . insulin aspart  1-3 Units Subcutaneous Q4H  . lidocaine  100 mg Intravenous Once  . mouth rinse  15 mL Mouth Rinse 10 times per day  . pantoprazole  40 mg Intravenous Q12H  . sennosides  5 mL Per Tube QHS  . sodium chloride flush  10-40 mL Intracatheter Q12H  . sodium chloride flush  3 mL Intravenous Q12H  . ticagrelor  90 mg Oral BID     Infusions: . sodium chloride Stopped (07/30/17 0000)  . sodium chloride    . amiodarone 30 mg/hr (07/31/17 2200)  . cisatracurium (NIMBEX) infusion 1.5 mcg/kg/min (08/01/17 0400)  . epinephrine Stopped (07/31/17 0136)  . fentaNYL infusion INTRAVENOUS 300 mcg/hr (08/01/17 0400)  . lidocaine 2 mg/min (08/01/17 0700)  . midazolam (VERSED) infusion 6 mg/hr (08/01/17 0400)  . norepinephrine (LEVOPHED) Adult infusion 35 mcg/min (08/01/17 0742)  . piperacillin-tazobactam (ZOSYN)  IV 3.375 g (08/01/17 0346)  . potassium chloride 10 mEq (08/01/17 0738)       Patient Profile   Lee Moore is a 42 y.o. male with PHM of CAD s/p inferior MI 2017, HTN, HLD.  Pt had witnessed arrest 07/30/17 at a friends house after baseball game.   Pt underwent emergent PCI 07/30/17. Noted to have multiple episodes of VT throughout weekend, and  re-current arrest with VF am of 08/01/17.  Assessment/Plan  1. VF Arrest - On lidocaine and amiodarone gtts.  - EP has been consulted. Will need ICD if improves.   2. Cardiogenic shock s/p STEMI - CVL in place. Check Coox - CVP 8-9 cm. No diuresis for now.  - Continue NE 35. Epi on stand-by, but MAPs in 70-80s currently. SBPs as high as 130s by ART line.   3. Severe CAD s/p PCI 07/30/17 - s/p DES to Ost RPDA to RPDA lesion this admit.  - S/p Multiple stents in [place] 2017. - Continue ASA and Brilinta - ? Re-occlusion per Dr. Terrence Dupont. Rechecking enzymes and considering re-look cath.   4. HLD - Continue atorvastatin 80. - LDL 29 on admit. TGs 232.   5. ? Anoxic brain injury - Some non-purposeful movement and ? Decorticate posturing noted.  - EEG performed and pending.   6. ID - Re-warming, but no fever - Covering with Zosyn with ? Aspiration.   Prognosis guarded. We will continue to follow along.   Medication concerns reviewed with patient and pharmacy team. Barriers identified: None at this time.   Length of Stay: 2  Annamaria Helling  08/01/2017, 7:49 AM  Advanced Heart Failure Team Pager [phone] (M-F; 7a - 4p)  Please contact [facility] for night-coverage after hours (4p -7a ) and weekends on [email or web address]  Patient seen with PA, agree with the above note.   Patient is intubated/sedated.  He is on norepinephrine 36 with BP 120s/70s.  Amiodarone 15 mg/hr and lidocaine 2.  He underwent hypothermia protocol and is being rewarmed. CVP 8 with co-ox 59%.   On exam, intubated/sedated.  No JVD.  Heart regular S1/S2, mild crackles at bases on lung exam.   1. CAD: Inferior STEMI.  Patient has history of MI in 2017, per report had a total of 5 stents in RCA system from the past.  MI this admission was associated with multiple episodes pulseless VT. To cath lab 5/4, found to have occluded RCA, relatively clear left coronary system.The patient had DES to proximal-mid  RCA, restoring flow to an acute marginal.  However, the distal RCA/PLV/PDA remained occluded.  There were collaterals to the PLV from the left. - Continue ASA 81, ticagrelor, atorvastatin 80 daily.  2. VF/VT arrest: Patient had pulseless VT initially at presentation and again in the cath lab.  Rhythm quiescent until this morning when he had VF arrest and was again cardioverted.  He was down > 15 minutes with initial rest prior to ROSC. He has undergone hypothermia protocol and is being rewarmed.  Concerned for some degree of anoxic injury due to possible posturing prior to intubation.  - Would use amiodarone 30 mg/hr (QTc mildly prolonged but being rewarmed so should improve, and long QT in setting of amiodarone use does not creatinine significantly higher risk of torsades).  - Decrease lidocaine to 1, hopefully stop tomorrow.  - So far, arrhythmias are within 48 hrs of PCI.  If he has neurologic recovery , would plan Lifevest at d/c then repeat echo with ICD if EF remains low.  If he has further VT and has neurologic recovery, will need ICD.  3. Cardiogenic shock: Ischemic cardiomyopathy, EF 30-35% with inferior WMA on echo this admission.  RV looks ok. He is on norepinephrine 36 currently in setting of hypothermia.  BP appears to be improving.  Co-ox 59% (adequate for now), CVP 8.  - Should be able to wean down on norepinephrine today.  - No Lasix needed at this time.  4. Neurological: Concerned for possible anoxic encephalopathy based on possible posturing prior to intubation. Will assess after rewarming.  5. ID: Possible aspiration PNA.  Covering with Zosyn.  6. AKI: Creatinine up to 2.  Maintain BP and follow.  Do not know his baseline.   Loralie Champagne 08/01/2017 10:00 AM

## 2017-08-01 NOTE — Procedures (Signed)
Arterial Catheter Insertion Procedure Note Chet Greenley [id number] 22-Sep-1975  Procedure: Insertion of Arterial Catheter  Indications: Blood pressure monitoring and Frequent blood sampling  Procedure Details Consent: Unable to obtain consent because of altered level of consciousness. Time Out: Verified patient identification, verified procedure, site/side was marked, verified correct patient position, special equipment/implants available, medications/allergies/relevent history reviewed, required imaging and test results available.  Performed  Maximum sterile technique was used including antiseptics, cap, gloves, gown, hand hygiene, mask and sheet. Skin prep: Chlorhexidine; local anesthetic administered 20 gauge catheter was inserted into left radial artery using the Seldinger technique. ULTRASOUND GUIDANCE USED: NO Evaluation Blood flow good; BP tracing good. Complications: No apparent complications.  Had to replace Aline in RR, ALINE is currently in LR and working fine. RT to cont to monitor.  Lang Snow 08/01/2017

## 2017-08-02 ENCOUNTER — Inpatient Hospital Stay (HOSPITAL_COMMUNITY): Payer: BLUE CROSS/BLUE SHIELD

## 2017-08-02 DIAGNOSIS — J9601 Acute respiratory failure with hypoxia: Secondary | ICD-10-CM

## 2017-08-02 LAB — COMPREHENSIVE METABOLIC PANEL
ALT: 259 U/L — ABNORMAL HIGH (ref 17–63)
ANION GAP: 11 (ref 5–15)
AST: 165 U/L — ABNORMAL HIGH (ref 15–41)
Albumin: 2.9 g/dL — ABNORMAL LOW (ref 3.5–5.0)
Alkaline Phosphatase: 48 U/L (ref 38–126)
BILIRUBIN TOTAL: 0.8 mg/dL (ref 0.3–1.2)
BUN: 27 mg/dL — ABNORMAL HIGH (ref 6–20)
CALCIUM: 6.9 mg/dL — AB (ref 8.9–10.3)
CO2: 16 mmol/L — ABNORMAL LOW (ref 22–32)
Chloride: 107 mmol/L (ref 101–111)
Creatinine, Ser: 2.38 mg/dL — ABNORMAL HIGH (ref 0.61–1.24)
GFR, EST AFRICAN AMERICAN: 37 mL/min — AB (ref >60)
GFR, EST NON AFRICAN AMERICAN: 32 mL/min — AB (ref >60)
GLUCOSE: 198 mg/dL — AB (ref 65–99)
Potassium: 5.2 mmol/L — ABNORMAL HIGH (ref 3.5–5.1)
Sodium: 134 mmol/L — ABNORMAL LOW (ref 135–145)
TOTAL PROTEIN: 5.5 g/dL — AB (ref 6.5–8.1)

## 2017-08-02 LAB — BLOOD GAS, ARTERIAL
ACID-BASE DEFICIT: 6.6 mmol/L — AB (ref 0.0–2.0)
BICARBONATE: 17.2 mmol/L — AB (ref 20.0–28.0)
FIO2: 40
MECHVT: 620 mL
O2 Saturation: 98.8 %
PATIENT TEMPERATURE: 96.1
PEEP/CPAP: 10 cmH2O
PH ART: 7.429 (ref 7.350–7.450)
PO2 ART: 145 mmHg — AB (ref 83.0–108.0)
RATE: 24 resp/min
pCO2 arterial: 26 mmHg — ABNORMAL LOW (ref 32.0–48.0)

## 2017-08-02 LAB — CBC WITH DIFFERENTIAL/PLATELET
Basophils Absolute: 0 10*3/uL (ref 0.0–0.1)
Basophils Relative: 0 %
EOS ABS: 0 10*3/uL (ref 0.0–0.7)
EOS PCT: 0 %
HCT: 30.3 % — ABNORMAL LOW (ref 39.0–52.0)
Hemoglobin: 10.4 g/dL — ABNORMAL LOW (ref 13.0–17.0)
LYMPHS ABS: 0.8 10*3/uL (ref 0.7–4.0)
Lymphocytes Relative: 8 %
MCH: 30.5 pg (ref 26.0–34.0)
MCHC: 34.3 g/dL (ref 30.0–36.0)
MCV: 88.9 fL (ref 78.0–100.0)
Monocytes Absolute: 0.6 10*3/uL (ref 0.1–1.0)
Monocytes Relative: 5 %
Neutro Abs: 9.7 10*3/uL — ABNORMAL HIGH (ref 1.7–7.7)
Neutrophils Relative %: 87 %
Platelets: 228 10*3/uL (ref 150–400)
RBC: 3.41 MIL/uL — AB (ref 4.22–5.81)
RDW: 14 % (ref 11.5–15.5)
WBC: 11.2 10*3/uL — AB (ref 4.0–10.5)

## 2017-08-02 LAB — COOXEMETRY PANEL
CARBOXYHEMOGLOBIN: 0.7 % (ref 0.5–1.5)
Carboxyhemoglobin: 0.8 % (ref 0.5–1.5)
Methemoglobin: 1 % (ref 0.0–1.5)
Methemoglobin: 1.7 % — ABNORMAL HIGH (ref 0.0–1.5)
O2 SAT: 61 %
O2 Saturation: 51.7 %
TOTAL HEMOGLOBIN: 10.7 g/dL — AB (ref 12.0–16.0)
Total hemoglobin: 10.9 g/dL — ABNORMAL LOW (ref 12.0–16.0)

## 2017-08-02 LAB — GLUCOSE, CAPILLARY
GLUCOSE-CAPILLARY: 82 mg/dL (ref 65–99)
Glucose-Capillary: 200 mg/dL — ABNORMAL HIGH (ref 65–99)
Glucose-Capillary: 165 mg/dL — ABNORMAL HIGH (ref 65–99)
Glucose-Capillary: 112 mg/dL — ABNORMAL HIGH (ref 65–99)
Glucose-Capillary: 80 mg/dL (ref 65–99)

## 2017-08-02 LAB — BASIC METABOLIC PANEL
ANION GAP: 10 (ref 5–15)
BUN: 24 mg/dL — AB (ref 6–20)
CHLORIDE: 106 mmol/L (ref 101–111)
CO2: 23 mmol/L (ref 22–32)
Calcium: 7.1 mg/dL — ABNORMAL LOW (ref 8.9–10.3)
Creatinine, Ser: 2.23 mg/dL — ABNORMAL HIGH (ref 0.61–1.24)
GFR, EST AFRICAN AMERICAN: 40 mL/min — AB (ref >60)
GFR, EST NON AFRICAN AMERICAN: 35 mL/min — AB (ref >60)
Glucose, Bld: 83 mg/dL (ref 65–99)
POTASSIUM: 3.6 mmol/L (ref 3.5–5.1)
SODIUM: 139 mmol/L (ref 135–145)

## 2017-08-02 LAB — POCT I-STAT, CHEM 8
BUN: 22 mg/dL — ABNORMAL HIGH (ref 6–20)
CALCIUM ION: 0.96 mmol/L — AB (ref 1.15–1.40)
CHLORIDE: 109 mmol/L (ref 101–111)
Creatinine, Ser: 2.2 mg/dL — ABNORMAL HIGH (ref 0.61–1.24)
GLUCOSE: 123 mg/dL — AB (ref 65–99)
HCT: 35 % — ABNORMAL LOW (ref 39.0–52.0)
Hemoglobin: 11.9 g/dL — ABNORMAL LOW (ref 13.0–17.0)
Potassium: 4.9 mmol/L (ref 3.5–5.1)
Sodium: 139 mmol/L (ref 135–145)
TCO2: 19 mmol/L — ABNORMAL LOW (ref 22–32)

## 2017-08-02 LAB — MAGNESIUM: Magnesium: 1.6 mg/dL — ABNORMAL LOW (ref 1.7–2.4)

## 2017-08-02 LAB — PROCALCITONIN: Procalcitonin: 2.61 ng/mL

## 2017-08-02 LAB — TROPONIN I: Troponin I: 19.74 ng/mL (ref <0.03)

## 2017-08-02 LAB — CULTURE, RESPIRATORY

## 2017-08-02 LAB — CULTURE, RESPIRATORY W GRAM STAIN: Culture: NORMAL

## 2017-08-02 LAB — PHOSPHORUS: PHOSPHORUS: 3.6 mg/dL (ref 2.5–4.6)

## 2017-08-02 LAB — LIDOCAINE LEVEL: LIDOCAINE LVL: 3.9 ug/mL (ref 1.5–5.0)

## 2017-08-02 MED ORDER — FUROSEMIDE 10 MG/ML IJ SOLN
80.0000 mg | Freq: Once | INTRAMUSCULAR | Status: AC
  Administered 2017-08-02: 80 mg via INTRAVENOUS
  Filled 2017-08-02: qty 8

## 2017-08-02 MED ORDER — ACETAMINOPHEN 650 MG RE SUPP: 650.0000 mg | RECTAL | Status: DC | PRN

## 2017-08-02 MED ORDER — TICAGRELOR 90 MG PO TABS
90.0000 mg | ORAL_TABLET | Freq: Two times a day (BID) | ORAL | Status: DC
  Administered 2017-08-02 – 2017-08-06 (×9): 90 mg via ORAL
  Filled 2017-08-02 (×9): qty 1

## 2017-08-02 MED ORDER — OXYCODONE-ACETAMINOPHEN 5-325 MG PO TABS
1.0000 | ORAL_TABLET | Freq: Four times a day (QID) | ORAL | Status: DC | PRN
  Administered 2017-08-02 – 2017-08-04 (×5): 1 via ORAL
  Filled 2017-08-02 (×5): qty 1

## 2017-08-02 MED ORDER — ATORVASTATIN CALCIUM 80 MG PO TABS
80.0000 mg | ORAL_TABLET | Freq: Every day | ORAL | Status: DC
  Administered 2017-08-02 – 2017-08-05 (×4): 80 mg via ORAL
  Filled 2017-08-02 (×4): qty 1

## 2017-08-02 MED ORDER — MAGNESIUM SULFATE IN D5W 1-5 GM/100ML-% IV SOLN
1.0000 g | Freq: Once | INTRAVENOUS | Status: AC
  Administered 2017-08-02: 1 g via INTRAVENOUS
  Filled 2017-08-02: qty 100

## 2017-08-02 MED ORDER — POTASSIUM CHLORIDE CRYS ER 20 MEQ PO TBCR
40.0000 meq | EXTENDED_RELEASE_TABLET | Freq: Once | ORAL | Status: AC
  Administered 2017-08-02: 40 meq via ORAL
  Filled 2017-08-02: qty 2

## 2017-08-02 MED ORDER — ISOSORBIDE MONONITRATE ER 30 MG PO TB24
30.0000 mg | ORAL_TABLET | Freq: Every day | ORAL | Status: DC
  Administered 2017-08-02: 30 mg via ORAL
  Filled 2017-08-02: qty 1

## 2017-08-02 MED ORDER — SODIUM POLYSTYRENE SULFONATE 15 GM/60ML PO SUSP
30.0000 g | Freq: Once | ORAL | Status: AC
Start: 2017-08-02 — End: 2017-08-02
  Administered 2017-08-02: 30 g
  Filled 2017-08-02: qty 120

## 2017-08-02 MED ORDER — ASPIRIN 81 MG PO CHEW
81.0000 mg | CHEWABLE_TABLET | Freq: Every day | ORAL | Status: DC
  Administered 2017-08-02 – 2017-08-06 (×5): 81 mg via ORAL
  Filled 2017-08-02 (×5): qty 1

## 2017-08-02 MED ORDER — FAMOTIDINE IN NACL 20-0.9 MG/50ML-% IV SOLN
20.0000 mg | Freq: Two times a day (BID) | INTRAVENOUS | Status: DC
  Administered 2017-08-02 (×2): 20 mg via INTRAVENOUS
  Filled 2017-08-02 (×2): qty 50

## 2017-08-02 MED ORDER — NITROGLYCERIN 0.4 MG SL SUBL
SUBLINGUAL_TABLET | SUBLINGUAL | Status: AC
  Administered 2017-08-02: 0.4 mg
  Filled 2017-08-02: qty 1

## 2017-08-02 MED ORDER — ACETAMINOPHEN 325 MG PO TABS
650.0000 mg | ORAL_TABLET | Freq: Four times a day (QID) | ORAL | Status: DC | PRN
  Administered 2017-08-04 – 2017-08-05 (×3): 650 mg via ORAL
  Filled 2017-08-02 (×3): qty 2

## 2017-08-02 MED ORDER — SPIRONOLACTONE 12.5 MG HALF TABLET
12.5000 mg | ORAL_TABLET | Freq: Every day | ORAL | Status: DC
  Administered 2017-08-02 – 2017-08-04 (×3): 12.5 mg via ORAL
  Filled 2017-08-02 (×4): qty 1

## 2017-08-02 MED ORDER — DOBUTAMINE IN D5W 4-5 MG/ML-% IV SOLN
2.0000 ug/kg/min | INTRAVENOUS | Status: DC
  Administered 2017-08-02: 2.5 ug/kg/min via INTRAVENOUS
  Filled 2017-08-02: qty 250

## 2017-08-02 MED ORDER — SODIUM CHLORIDE 0.9 % IV SOLN
1.0000 g | Freq: Once | INTRAVENOUS | Status: AC
  Administered 2017-08-02: 1 g via INTRAVENOUS
  Filled 2017-08-02: qty 10

## 2017-08-02 NOTE — Progress Notes (Signed)
Patient ID: Lee Moore, male   DOB: 12-22-1975, 42 y.o.   MRN: [id number]     Advanced Heart Failure Rounding Note  PCP-Cardiologist: No primary care provider on file.   Subjective:    Patient remains intubated, now rewarming.  He has made purposeful motion, moving toes for nurse.   He remains on lidocaine at 1 and amiodarone at 30, no further VT.   He is on norepinephrine 37, MAP goal now down to 65 with rewarming.  Co-ox today 52%.    Poor UOP overnight, creatinine up to 2.38.  Got dose of Lasix 80 mg IV with good response.  CVP 13.    Objective:   Weight Range: 228 lb 2.5 oz (103.5 kg) Body mass index is 30.94 kg/m.   Vital Signs:   Temp:  [90.5 F (32.5 C)-99.7 F (37.6 C)] 99.7 F (37.6 C) (05/07 0600) Pulse Rate:  [56-92] 76 (05/07 0700) Resp:  [0-31] 24 (05/07 0730) BP: (65-152)/(41-108) 129/87 (05/07 0730) SpO2:  [86 %-100 %] 100 % (05/07 0700) Arterial Line BP: (74-169)/(45-96) 146/74 (05/07 0730) FiO2 (%):  [40 %-100 %] 40 % (05/07 0400) Last BM Date: (pta)  Weight change: Filed Weights   07/30/17 1934 07/31/17 0410  Weight: 225 lb (102.1 kg) 228 lb 2.5 oz (103.5 kg)    Intake/Output:   Intake/Output Summary (Last 24 hours) at 08/02/2017 0748 Last data filed at 08/02/2017 0630 Gross per 24 hour  Intake 3019.33 ml  Output 2375 ml  Net 644.33 ml      Physical Exam    General: Intubated, sedated.  Neck: JVP 10 cm, no thyromegaly or thyroid nodule.  Lungs: Decreased BS at bases CV: Nondisplaced PMI.  Heart regular S1/S2, no S3/S4, no murmur.  No peripheral edema.   Abdomen: Soft, nontender, no hepatosplenomegaly, no distention.  Skin: Intact without lesions or rashes.  Neurologic: Following some commands.   Extremities: No clubbing or cyanosis.  HEENT: Normal.    Telemetry   NSR, personally reviewed  Labs    CBC Recent Labs    08/01/17 0356  08/01/17 1542 08/02/17 0328  WBC 9.2  --   --  11.2*  NEUTROABS 6.4  --   --  9.7*  HGB 11.7*    < > 9.9* 10.4*  HCT 33.9*   < > 29.0* 30.3*  MCV 86.9  --   --  88.9  PLT 225  --   --  228   < > = values in this interval not displayed.   Basic Metabolic Panel Recent Labs    08/01/17 0356  08/01/17 1542 08/02/17 0328  NA 137   < > 142 134*  K 3.8   < > 4.4 5.2*  CL 109   < > 114* 107  CO2 18*  --   --  16*  GLUCOSE 125*   < > 117* 198*  BUN 21*   < > 24* 27*  CREATININE 1.85*   < > 2.00* 2.38*  CALCIUM 6.7*  --   --  6.9*  MG 2.0  --   --  1.6*  PHOS 3.0  --   --  3.6   < > = values in this interval not displayed.   Liver Function Tests Recent Labs    08/01/17 0356 08/02/17 0328  AST 219* 165*  ALT 335* 259*  ALKPHOS 46 48  BILITOT 0.6 0.8  PROT 5.3* 5.5*  ALBUMIN 2.6* 2.9*   No results for input(s): LIPASE, AMYLASE in  the last 72 hours. Cardiac Enzymes Recent Labs    08/01/17 1321 08/01/17 1826 08/02/17 0328  TROPONINI 28.80* 27.47* 19.74*    BNP: BNP (last 3 results) No results for input(s): BNP in the last 8760 hours.  ProBNP (last 3 results) No results for input(s): PROBNP in the last 8760 hours.   D-Dimer No results for input(s): DDIMER in the last 72 hours. Hemoglobin A1C Recent Labs    07/30/17 2005  HGBA1C 5.6   Fasting Lipid Panel Recent Labs    07/30/17 2005 07/30/17 2208  CHOL 133  --   HDL 56  --   LDLCALC 29  --   TRIG 239* 232*  CHOLHDL 2.4  --    Thyroid Function Tests No results for input(s): TSH, T4TOTAL, T3FREE, THYROIDAB in the last 72 hours.  Invalid input(s): FREET3  Other results:   Imaging     No results found.   Medications:     Scheduled Medications: . aspirin  81 mg Per Tube Daily  . atorvastatin  80 mg Per Tube q1800  . chlorhexidine gluconate (MEDLINE KIT)  15 mL Mouth Rinse BID  . Chlorhexidine Gluconate Cloth  6 each Topical Daily  . docusate  100 mg Per Tube BID  . enoxaparin (LOVENOX) injection  40 mg Subcutaneous Q24H  . furosemide  80 mg Intravenous Once  . insulin aspart  1-3  Units Subcutaneous Q4H  . mouth rinse  15 mL Mouth Rinse 10 times per day  . pantoprazole  40 mg Intravenous Q12H  . sennosides  5 mL Per Tube QHS  . sodium chloride flush  10-40 mL Intracatheter Q12H  . sodium chloride flush  3 mL Intravenous Q12H  . ticagrelor  90 mg Per Tube BID     Infusions: . sodium chloride Stopped (07/30/17 0000)  . amiodarone 30 mg/hr (08/01/17 2357)  . dexmedetomidine (PRECEDEX) IV infusion 0.6 mcg/kg/hr (08/02/17 0600)  . DOBUTamine    . epinephrine Stopped (07/31/17 0136)  . lidocaine 1 mg/min (08/01/17 2000)  . magnesium sulfate 1 - 4 g bolus IVPB    . norepinephrine (LEVOPHED) Adult infusion 37 mcg/min (08/02/17 0600)  . piperacillin-tazobactam (ZOSYN)  IV 3.375 g (08/02/17 0337)     PRN Medications:  acetaminophen (TYLENOL) oral liquid 160 mg/5 mL, albuterol, fentaNYL (SUBLIMAZE) injection, midazolam, ondansetron (ZOFRAN) IV, sodium chloride flush, sodium chloride flush   Assessment/Plan   1. CAD: Inferior STEMI.  Patient has history of MI in 2017, per report had a total of 5 stents in RCA system from the past.  MI this admission was associated with multiple episodes pulseless VT. To cath lab 5/4, found to have occluded RCA, relatively clear left coronary system.The patient had DES to proximal-mid RCA, restoring flow to an acute marginal.  However, the distal RCA/PLV/PDA remained occluded.  There were collaterals to the PLV from the left. - Continue ASA 81, ticagrelor, atorvastatin 80 daily.  - No further revascularization option currently.  2. VF/VT arrest: Patient had pulseless VT initially at presentation and again in the cath lab.  Early 5/6 am he had VF arrest and was again cardioverted.  He was down > 15 minutes with initial arrest prior to ROSC. He has undergone hypothermia protocol and is being rewarmed.  He has been noted to follow commands.  - Continue amiodarone 30 mg/hr.   - Continue lidocaine at 1 today, probably stop tomorrow if no  further arrhythmias.  Will check lidocaine level now.   - So far, ventricular  arrhythmias have been within 48 hrs of PCI.  If he has neurologic recovery , would plan Lifevest at d/c then repeat echo with ICD if EF remains low.  If he has further VT and has neurologic recovery, will need ICD this admission.  3. Cardiogenic shock: Ischemic cardiomyopathy, EF 30-35% with inferior WMA on echo this admission.  RV looks ok. He is on norepinephrine 37 currently in setting of hypothermia.  BP appears to be improving.  Co-ox 52%, CVP 13-14 (higher).  UOP was poor until IV Lasix given this morning.  - With rewarming, aim for lower MAP of 65 and can wean norepinephrine.  - With low co-ox and poor UOP, will add low dose dobutamine at 2.5 mcg/kg/min.  Repeat co-ox in pm.   - I will give an additional dose of 80 mg IV Lasix this afternoon.  UOP picking up with Lasix.  4. Neurological: Concerned for possible anoxic encephalopathy based on possible posturing prior to intubation. Will assess after rewarming => now appears to be following some commands.  5. ID: Possible aspiration PNA.  Covering with Zosyn.  6. AKI: Creatinine up to 2.38.  Maintain BP and cardiac output and follow.  Do not know his baseline.   CRITICAL CARE Performed by: Loralie Champagne  Total critical care time: 35 minutes  Critical care time was exclusive of separately billable procedures and treating other patients.  Critical care was necessary to treat or prevent imminent or life-threatening deterioration.  Critical care was time spent personally by me on the following activities: development of treatment plan with patient and/or surrogate as well as nursing, discussions with consultants, evaluation of patient's response to treatment, examination of patient, obtaining history from patient or surrogate, ordering and performing treatments and interventions, ordering and review of laboratory studies, ordering and review of radiographic studies,  pulse oximetry and re-evaluation of patient's condition.  Length of Stay: 3  Loralie Champagne, MD  08/02/2017, 7:48 AM  Advanced Heart Failure Team Pager [phone] (M-F; 7a - 4p)  Please contact [facility] for night-coverage after hours (4p -7a ) and weekends on [email or web address]

## 2017-08-02 NOTE — Progress Notes (Signed)
PCCM Progress Note  Admission date: 07/30/2017 Referring provider: Dr. Dalene Seltzer, ER CC: Cardiac arrest  HPI: 42 yo male had VF cardiac arrest at home. PMHx of HTN, CAD, HLD.  Subjective: Denies chest pain.  Tolerating SBT.  Vital signs: BP 129/87   Pulse 76   Temp 99.7 F (37.6 C)   Resp (!) 24   Ht 6' (1.829 m)   Wt 228 lb 2.5 oz (103.5 kg)   SpO2 100%   BMI 30.94 kg/m   Intake/outpt: I/O last 3 completed shifts: In: 4752.3 [I.V.:3532.3; NG/GT:250; IV Piggyback:970] Out: 3010 [Urine:505; Emesis/NG output:2505]  Physical exam:  General - alert Eyes - pupils reactive ENT - ETT in place Cardiac - regular, no murmur Chest - no wheeze, rales Abd - soft, non tender Ext - no edema Skin - no rashes Neuro - follows commands   CBC Recent Labs    07/31/17 0515  08/01/17 0356  08/01/17 1215 08/01/17 1542 08/02/17 0328  WBC 11.9*  --  9.2  --   --   --  11.2*  HGB 13.6   < > 11.7*   < > 11.2* 9.9* 10.4*  HCT 39.9   < > 33.9*   < > 33.0* 29.0* 30.3*  PLT 295  --  225  --   --   --  228   < > = values in this interval not displayed.    Coag's Recent Labs    07/30/17 1935 07/31/17 0110 07/31/17 1058 08/01/17 0624  APTT 33 69* 40*  --   INR 1.17 2.23  --  1.40    BMET Recent Labs    07/31/17 0515  08/01/17 0356  08/01/17 1215 08/01/17 1542 08/02/17 0328  NA 136   < > 137   < > 139 142 134*  K 4.0   < > 3.8   < > 4.3 4.4 5.2*  CL 104   < > 109   < > 108 114* 107  CO2 18*  --  18*  --   --   --  16*  BUN 17   < > 21*   < > 20 24* 27*  CREATININE 2.13*   < > 1.85*   < > 2.20* 2.00* 2.38*  GLUCOSE 184*   < > 125*   < > 134* 117* 198*   < > = values in this interval not displayed.    Electrolytes Recent Labs    07/31/17 0515 07/31/17 2308 08/01/17 0356 08/02/17 0328  CALCIUM 7.3*  --  6.7* 6.9*  MG 1.9 2.0 2.0 1.6*  PHOS 1.8*  --  3.0 3.6    Sepsis Markers Recent Labs    07/31/17 0110 08/01/17 0356 08/02/17 0328  PROCALCITON 2.59  5.04 2.61    ABG Recent Labs    08/01/17 0633 08/01/17 0925 08/02/17 0325  PHART 7.338* 7.482* 7.429  PCO2ART 30.2* 25.9* 26.0*  PO2ART 87.0 278.0* 145*    Liver Enzymes Recent Labs    07/31/17 0515 08/01/17 0356 08/02/17 0328  AST 625* 219* 165*  ALT 546* 335* 259*  ALKPHOS 56 46 48  BILITOT 0.6 0.6 0.8  ALBUMIN 3.3* 2.6* 2.9*    Cardiac Enzymes Recent Labs    08/01/17 1321 08/01/17 1826 08/02/17 0328  TROPONINI 28.80* 27.47* 19.74*    Glucose Recent Labs    08/01/17 0922 08/01/17 1216 08/01/17 1539 08/01/17 1931 08/01/17 2342 08/02/17 0328  GLUCAP 163* 123* 118* 77 164* 200*    Imaging Ct  Head Wo Contrast  Result Date: 07/31/2017 CLINICAL DATA:  42 year old male with history of cardiac arrest. EXAM: CT HEAD WITHOUT CONTRAST TECHNIQUE: Contiguous axial images were obtained from the base of the skull through the vertex without intravenous contrast. COMPARISON:  None. FINDINGS: Brain: No evidence of acute infarction, hemorrhage, hydrocephalus, extra-axial collection or mass lesion/mass effect. Vascular: No hyperdense vessel or unexpected calcification. Skull: Normal. Negative for fracture or focal lesion. Sinuses/Orbits: Mild multifocal mucosal thickening throughout the paranasal sinuses. No acute finding. Other: Support tubes noted in the oral cavity, incompletely imaged. IMPRESSION: 1. No acute intracranial abnormalities. Electronically Signed   By: Trudie Reed M.D.   On: 07/31/2017 14:52   Dg Chest Port 1 View  Result Date: 08/01/2017 CLINICAL DATA:  ETT, respiratory failure EXAM: PORTABLE CHEST 1 VIEW COMPARISON:  07/31/2017 at 1208 hours FINDINGS: Endotracheal tube terminates 2.5 cm above the carina. Right IJ venous catheter terminates in the upper right atrium, 2.5 cm below the cavoatrial junction. Enteric tube terminates in the gastric cardia. Defibrillator pads overlying the left hemithorax. Mild patchy medial right lower lobe opacity, likely  atelectasis. No pleural effusion or pneumothorax. IMPRESSION: Endotracheal tube terminates 2.5 cm above the carina. Additional support apparatus as above. Electronically Signed   By: Charline Bills M.D.   On: 08/01/2017 07:22   Dg Chest Port 1 View  Result Date: 08/01/2017 CLINICAL DATA:  Cardiac arrest, ETT EXAM: PORTABLE CHEST 1 VIEW COMPARISON:  08/01/2017 at 0520 hours FINDINGS: Endotracheal tube terminates 2.5 cm above the carina. Mild patchy/hazy right perihilar and upper lobe opacity, possibly reflecting aspiration. No pleural effusion or pneumothorax. Heart is normal in size. Right IJ venous catheter terminates the cavoatrial junction. Enteric tube terminates in the gastric cardia. Defibrillator pads overlying the left hemithorax. IMPRESSION: Endotracheal tube terminates 2.5 cm above the carina. Mild patchy right lung opacities, possibly reflecting aspiration. Additional support apparatus as above. Electronically Signed   By: Charline Bills M.D.   On: 08/01/2017 07:21    Studies: LHC 5/04 Echo 5/05 >> EF 30 to 35% CT head 5/05 >> no acute findings  Cultures: Sputum 5/05 >>   Antibiotics: Zosyn 5/04 >>  Events: 5/06 VF/PEA/Asystolic with ROSC in 6 minutes 5/07 Off pressors  Lines/tubes: ETT 5/04 >>  Rt IJ CVL 5/05 >>  Discussion: 42 yo with VF arrest with STEMI with hx of CAD, HTN, HLD.  Had recurrent cardiac arrest this AM.  Assessment/plan:  Acute hypoxic respiratory failure. - proceed with extubation 5/07  VF cardiac arrest. Cardiogenic shock >> resolved. STEMI with CAD. Acute systolic CHF. HLD. - amiodarone, lidocaine per cardiology - continue ASA, lipitor, brilinta - EP cardiology assessing for defibrillator  Aspiration pneumonia. - day 4/5 of Abx  Acute anoxic/metabolic encephalopathy. - mental status much improved 5/07 - monitor  Acute renal failure 2nd to ATN. Hyperkalemia. Hypomagnesemia. - monitor renal fx, urine outpt  Hyperglycemia. -  SSI  DVT prophylaxis - lovenox SUP - pepcid Nutrition - NPO Goals of care - full code  CC time 31 minutes  Coralyn Helling, MD [facility]/Critical Care 08/02/2017, 7:49 AM

## 2017-08-02 NOTE — Progress Notes (Signed)
[facility] ringing out that it is stopping therapy due to prolonged high water temperature exposure. Called [employer]. Instructed to check skin to ensure safety of patient and restart therapy. Bare hugger applied. Monitoring patient and water temperature closely.

## 2017-08-02 NOTE — Progress Notes (Signed)
Bladder scanned patient multiple times overnight and patient had 0 mL of urine in bladder. After giving lasix and kayexalate, went to turn patient and noticed bed was wet. Flushed Foley and noticed it leaking. Call [facility] RN and was told to replace Foley. Patient immediately put out 1100 mL of sediment urine.

## 2017-08-02 NOTE — Progress Notes (Signed)
Per insurance check for Brilinta  # 5  S/W MEGAN @ [facility] # [phone]    BRILINTA 90 MG BID  COVER- YES  CO-PAY- $ 50.00  PRIOR APPROVAL- NO   PREFERRED PHARMACY : YES- [facility]

## 2017-08-02 NOTE — Progress Notes (Signed)
eLink Physician-Brief Progress Note Patient Name: Lee Moore DOB: 1975/04/28 MRN: [id number]   Date of Service  08/02/2017  HPI/Events of Note  Multiple issues: 1. Oliguria - Creatinine = 2.38 and CVP = 14 and 2. K+ = 5.2, Mg++ = 1.6 and Ca++ = 6.9.  eICU Interventions  Will order: 1. Lasix 80 mg X 1 now.  2. Kayexalate 30 gm per tube now. 3. Replace Mg++.  4. Replace Ca++.     Intervention Category Major Interventions: Electrolyte abnormality - evaluation and management  Felipe Paluch [place] 08/02/2017, 4:55 AM

## 2017-08-02 NOTE — Progress Notes (Signed)
[place] pads removed per Dr. Craige Cotta as they are only agitating him more. Will continue to monitor.

## 2017-08-02 NOTE — Plan of Care (Signed)
  Problem: Education: Goal: Knowledge of General Education information will improve Outcome: Progressing   Problem: Health Behavior/Discharge Planning: Goal: Ability to manage health-related needs will improve Outcome: Progressing   Problem: Clinical Measurements: Goal: Ability to maintain clinical measurements within normal limits will improve Outcome: Progressing Goal: Will remain free from infection Outcome: Progressing Goal: Diagnostic test results will improve Outcome: Progressing Goal: Respiratory complications will improve Outcome: Progressing Note:  Patient extubated today and is now on room air. Goal: Cardiovascular complication will be avoided Outcome: Progressing   Problem: Activity: Goal: Risk for activity intolerance will decrease Outcome: Progressing   Problem: Nutrition: Goal: Adequate nutrition will be maintained Outcome: Progressing Note:  Patient tolerating clear liquids.   Problem: Coping: Goal: Level of anxiety will decrease Outcome: Progressing   Problem: Elimination: Goal: Will not experience complications related to bowel motility Outcome: Progressing Note:  Patient had two bowel movements today. Goal: Will not experience complications related to urinary retention Outcome: Progressing Note:  Patient voided since removal of foley catheter.   Problem: Pain Managment: Goal: General experience of comfort will improve Outcome: Progressing   Problem: Safety: Goal: Ability to remain free from injury will improve Outcome: Progressing   Problem: Skin Integrity: Goal: Risk for impaired skin integrity will decrease Outcome: Progressing   Problem: Education: Goal: Understanding of CV disease, CV risk reduction, and recovery process will improve Outcome: Progressing   Problem: Activity: Goal: Ability to return to baseline activity level will improve Outcome: Progressing   Problem: Cardiovascular: Goal: Ability to achieve and maintain  adequate cardiovascular perfusion will improve Outcome: Progressing Goal: Vascular access site(s) Level 0-1 will be maintained Outcome: Progressing   Problem: Health Behavior/Discharge Planning: Goal: Ability to safely manage health-related needs after discharge will improve Outcome: Progressing   Problem: Education: Goal: Ability to manage disease process will improve Outcome: Progressing   Problem: Cardiac: Goal: Ability to achieve and maintain adequate cardiopulmonary perfusion will improve Outcome: Progressing   Problem: Neurologic: Goal: Promote progressive neurologic recovery Outcome: Progressing Note:  Patient oriented to person, place, time, and situation.   Problem: Skin Integrity: Goal: Risk for impaired skin integrity will be minimized. Outcome: Progressing

## 2017-08-02 NOTE — Progress Notes (Signed)
Subjective:  Patient awake successfully extubated earlier this morning. Denies any chest pain or shortness of breath no further episodes of V. Fib. EKG appears to be improved this morning as compared to prior EKG cardiac enzymes are trending down levo fed is being weaned off. On low-dose dobutamine, heart rate slightly creeping up  Objective:  Vital Signs in the last 24 hours: Temp:  [90.9 F (32.7 C)-99.7 F (37.6 C)] 99.7 F (37.6 C) (05/07 0600) Pulse Rate:  [56-92] 88 (05/07 1000) Resp:  [0-31] 27 (05/07 1000) BP: (65-157)/(41-108) 103/71 (05/07 0945) SpO2:  [86 %-100 %] 100 % (05/07 1000) Arterial Line BP: (74-175)/(45-96) 152/68 (05/07 1000) FiO2 (%):  [40 %-50 %] 40 % (05/07 0755)  Intake/Output from previous day: 05/06 0701 - 05/07 0700 In: 3019.3 [I.V.:2149.3; NG/GT:250; IV Piggyback:620] Out: 2375 [Urine:420; Emesis/NG output:1955] Intake/Output from this shift: Total I/O In: -  Out: 1425 [Urine:1425]  Physical Exam: Neck: no adenopathy, no carotid bruit, no JVD and supple, symmetrical, trachea midline Lungs: Clear anterolaterally Heart: regular rate and rhythm, S1, S2 normal and Soft systolic murmur noted Abdomen: soft, non-tender; bowel sounds normal; no masses,  no organomegaly Extremities: extremities normal, atraumatic, no cyanosis or edema and Right groin stable  Lab Results: Recent Labs    08/01/17 0356  08/01/17 2007 08/02/17 0328  WBC 9.2  --   --  11.2*  HGB 11.7*   < > 11.9* 10.4*  PLT 225  --   --  228   < > = values in this interval not displayed.   Recent Labs    08/01/17 0356  08/01/17 2007 08/02/17 0328  NA 137   < > 139 134*  K 3.8   < > 4.9 5.2*  CL 109   < > 109 107  CO2 18*  --   --  16*  GLUCOSE 125*   < > 123* 198*  BUN 21*   < > 22* 27*  CREATININE 1.85*   < > 2.20* 2.38*   < > = values in this interval not displayed.   Recent Labs    08/01/17 1826 08/02/17 0328  TROPONINI 27.47* 19.74*   Hepatic Function Panel Recent  Labs    07/31/17 0515  08/02/17 0328  PROT 5.9*   < > 5.5*  ALBUMIN 3.3*   < > 2.9*  AST 625*   < > 165*  ALT 546*   < > 259*  ALKPHOS 56   < > 48  BILITOT 0.6   < > 0.8  BILIDIR 0.2  --   --   IBILI 0.4  --   --    < > = values in this interval not displayed.   Recent Labs    07/30/17 2005  CHOL 133   No results for input(s): PROTIME in the last 72 hours.  Imaging: Imaging results have been reviewed and Ct Head Wo Contrast  Result Date: 07/31/2017 CLINICAL DATA:  42 year old male with history of cardiac arrest. EXAM: CT HEAD WITHOUT CONTRAST TECHNIQUE: Contiguous axial images were obtained from the base of the skull through the vertex without intravenous contrast. COMPARISON:  None. FINDINGS: Brain: No evidence of acute infarction, hemorrhage, hydrocephalus, extra-axial collection or mass lesion/mass effect. Vascular: No hyperdense vessel or unexpected calcification. Skull: Normal. Negative for fracture or focal lesion. Sinuses/Orbits: Mild multifocal mucosal thickening throughout the paranasal sinuses. No acute finding. Other: Support tubes noted in the oral cavity, incompletely imaged. IMPRESSION: 1. No acute intracranial abnormalities. Electronically Signed  By: Trudie Reed M.D.   On: 07/31/2017 14:52   Dg Chest Port 1 View  Result Date: 08/02/2017 CLINICAL DATA:  Evaluate endotracheal tube, respiratory failure EXAM: PORTABLE CHEST 1 VIEW COMPARISON:  Portable chest x-ray of 08/01/2017 FINDINGS: The tip of the endotracheal tube is approximately 3.3 cm above the carina. Right IJ central venous line tip overlies the expected SVC-RA junction. Minimal haziness again is noted throughout the right hemithorax with no abnormality noted in the left lung. This could represent pneumonia possibly due to aspiration and follow-up is recommended to ensure clearing. Heart is mildly enlarged and stable. No bony abnormality is seen. IMPRESSION: 1. Tip of endotracheal tube 3.3 cm above carina. 2.  Some haziness remains throughout the right lung which could be due to pneumonia possibly aspiration and follow-up is recommended Electronically Signed   By: Dwyane Dee M.D.   On: 08/02/2017 09:52   Dg Chest Port 1 View  Result Date: 08/01/2017 CLINICAL DATA:  ETT, respiratory failure EXAM: PORTABLE CHEST 1 VIEW COMPARISON:  07/31/2017 at 1208 hours FINDINGS: Endotracheal tube terminates 2.5 cm above the carina. Right IJ venous catheter terminates in the upper right atrium, 2.5 cm below the cavoatrial junction. Enteric tube terminates in the gastric cardia. Defibrillator pads overlying the left hemithorax. Mild patchy medial right lower lobe opacity, likely atelectasis. No pleural effusion or pneumothorax. IMPRESSION: Endotracheal tube terminates 2.5 cm above the carina. Additional support apparatus as above. Electronically Signed   By: Charline Bills M.D.   On: 08/01/2017 07:22   Dg Chest Port 1 View  Result Date: 08/01/2017 CLINICAL DATA:  Cardiac arrest, ETT EXAM: PORTABLE CHEST 1 VIEW COMPARISON:  08/01/2017 at 0520 hours FINDINGS: Endotracheal tube terminates 2.5 cm above the carina. Mild patchy/hazy right perihilar and upper lobe opacity, possibly reflecting aspiration. No pleural effusion or pneumothorax. Heart is normal in size. Right IJ venous catheter terminates the cavoatrial junction. Enteric tube terminates in the gastric cardia. Defibrillator pads overlying the left hemithorax. IMPRESSION: Endotracheal tube terminates 2.5 cm above the carina. Mild patchy right lung opacities, possibly reflecting aspiration. Additional support apparatus as above. Electronically Signed   By: Charline Bills M.D.   On: 08/01/2017 07:21    Cardiac Studies:  Assessment/Plan:  Status post out of hospital V. Fib cardiac arrest. 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 Status post recurrent V. Fib cardiac arrest. Cardiogenic shock. Ischemic cardiomyopathy, history of inferior wall  MI in the past, status post PCI to distal RCA, PDA and PLV branch in [place] requiring multiple stents. Hypertension. Elevated blood sugar, rule out diabetes. Acute on chronic kidney injury, multifactorial. Possible aspiration pneumonia Plan Wean off inotropes as blood pressure tolerates We'll start beta blockers and ARB once blood pressure and renal function improves   LOS: 3 days    Rinaldo Cloud 08/02/2017, 10:01 AM

## 2017-08-02 NOTE — Progress Notes (Signed)
  Paged for 2/10 Chest pressure.   Relieved with Sublingual NG x 1. No further.   Discussed with Dr. Shirlee Latch; expected with sub-total revascularization. Will start on Imdur 30 mg daily.    Continue current lasix and dobutamine. No VT on tele.     Casimiro Needle [address]" [place], [place] 08/02/2017 12:36 PM

## 2017-08-02 NOTE — Procedures (Signed)
Extubation Procedure Note  Patient Details:   Name: Lee Moore DOB: Mar 15, 1976 MRN: [id number]   Airway Documentation:    Vent end date: 08/02/17 Vent end time: 0928   Evaluation  O2 sats: stable throughout Complications: No apparent complications Patient did tolerate procedure well. Bilateral Breath Sounds: Diminished   Yes   Patient was extubated to a 4L [place] without any complications, dyspnea or stridor noted. Patient was instructed on IS x 5, highest goal achieved was .  Adja Ruff, Margaretmary Dys 08/02/2017, 9:28 AM

## 2017-08-02 NOTE — Progress Notes (Signed)
Paged [place], [place] with lidocaine level results. Level is 3.9.  Delories Heinz, RN

## 2017-08-02 NOTE — Progress Notes (Signed)
Patient ID: Lee Moore, male   DOB: 1976/02/20, 42 y.o.   MRN: [id number]  Patient extubated and doing well, off norepinephrine.  He is alert/oriented.  CVP 13-14.  Co-ox up to 61% on dobutamine.  - Lasix 80 mg IV x 1 this evening.  Will replace K and start spironolactone.  - Decrease dobutamine to 2 and decrease lidocaine to 0.5 (lido level was ok today).  If he diureses well overnight, work on stopping both of these meds.    Marca Ancona 08/02/2017 4:48 PM

## 2017-08-03 ENCOUNTER — Other Ambulatory Visit: Payer: Self-pay

## 2017-08-03 ENCOUNTER — Encounter (HOSPITAL_COMMUNITY): Payer: Self-pay | Admitting: *Deleted

## 2017-08-03 LAB — COMPREHENSIVE METABOLIC PANEL
ALBUMIN: 3.3 g/dL — AB (ref 3.5–5.0)
ALK PHOS: 49 U/L (ref 38–126)
ALT: 202 U/L — ABNORMAL HIGH (ref 17–63)
ANION GAP: 9 (ref 5–15)
AST: 144 U/L — ABNORMAL HIGH (ref 15–41)
BILIRUBIN TOTAL: 1.3 mg/dL — AB (ref 0.3–1.2)
BUN: 20 mg/dL (ref 6–20)
CALCIUM: 7.7 mg/dL — AB (ref 8.9–10.3)
CO2: 25 mmol/L (ref 22–32)
Chloride: 105 mmol/L (ref 101–111)
Creatinine, Ser: 2.14 mg/dL — ABNORMAL HIGH (ref 0.61–1.24)
GFR calc non Af Amer: 36 mL/min — ABNORMAL LOW (ref >60)
GFR, EST AFRICAN AMERICAN: 42 mL/min — AB (ref >60)
Glucose, Bld: 97 mg/dL (ref 65–99)
Potassium: 3.4 mmol/L — ABNORMAL LOW (ref 3.5–5.1)
Sodium: 139 mmol/L (ref 135–145)
TOTAL PROTEIN: 6.5 g/dL (ref 6.5–8.1)

## 2017-08-03 LAB — GLUCOSE, CAPILLARY
GLUCOSE-CAPILLARY: 104 mg/dL — AB (ref 65–99)
GLUCOSE-CAPILLARY: 85 mg/dL (ref 65–99)
GLUCOSE-CAPILLARY: 89 mg/dL (ref 65–99)
GLUCOSE-CAPILLARY: 94 mg/dL (ref 65–99)
GLUCOSE-CAPILLARY: 96 mg/dL (ref 65–99)

## 2017-08-03 LAB — CBC WITH DIFFERENTIAL/PLATELET
BASOS PCT: 0 %
Basophils Absolute: 0 10*3/uL (ref 0.0–0.1)
EOS ABS: 0 10*3/uL (ref 0.0–0.7)
Eosinophils Relative: 0 %
HEMATOCRIT: 28.5 % — AB (ref 39.0–52.0)
HEMOGLOBIN: 9.7 g/dL — AB (ref 13.0–17.0)
LYMPHS ABS: 1.5 10*3/uL (ref 0.7–4.0)
Lymphocytes Relative: 20 %
MCH: 30.6 pg (ref 26.0–34.0)
MCHC: 34 g/dL (ref 30.0–36.0)
MCV: 89.9 fL (ref 78.0–100.0)
MONOS PCT: 5 %
Monocytes Absolute: 0.4 10*3/uL (ref 0.1–1.0)
NEUTROS ABS: 5.6 10*3/uL (ref 1.7–7.7)
NEUTROS PCT: 75 %
Platelets: 175 10*3/uL (ref 150–400)
RBC: 3.17 MIL/uL — ABNORMAL LOW (ref 4.22–5.81)
RDW: 14 % (ref 11.5–15.5)
WBC: 7.5 10*3/uL (ref 4.0–10.5)

## 2017-08-03 LAB — COOXEMETRY PANEL
CARBOXYHEMOGLOBIN: 1.2 % (ref 0.5–1.5)
CARBOXYHEMOGLOBIN: 0.9 % (ref 0.5–1.5)
CARBOXYHEMOGLOBIN: 1.4 % (ref 0.5–1.5)
Methemoglobin: 1.1 % (ref 0.0–1.5)
Methemoglobin: 1.5 % (ref 0.0–1.5)
Methemoglobin: 1.1 % (ref 0.0–1.5)
O2 SAT: 53.1 %
O2 SAT: 51.5 %
O2 Saturation: 60.7 %
Total hemoglobin: 9.4 g/dL — ABNORMAL LOW (ref 12.0–16.0)
Total hemoglobin: 14 g/dL (ref 12.0–16.0)
Total hemoglobin: 11.2 g/dL — ABNORMAL LOW (ref 12.0–16.0)

## 2017-08-03 LAB — MAGNESIUM: MAGNESIUM: 1.8 mg/dL (ref 1.7–2.4)

## 2017-08-03 LAB — PHOSPHORUS: PHOSPHORUS: 2.2 mg/dL — AB (ref 2.5–4.6)

## 2017-08-03 MED ORDER — POTASSIUM CHLORIDE CRYS ER 20 MEQ PO TBCR
40.0000 meq | EXTENDED_RELEASE_TABLET | ORAL | Status: AC
  Administered 2017-08-03 (×2): 40 meq via ORAL
  Filled 2017-08-03 (×2): qty 2

## 2017-08-03 MED ORDER — ALPRAZOLAM 0.25 MG PO TABS
0.2500 mg | ORAL_TABLET | Freq: Four times a day (QID) | ORAL | Status: DC | PRN
  Administered 2017-08-03 – 2017-08-05 (×2): 0.25 mg via ORAL
  Filled 2017-08-03 (×2): qty 1

## 2017-08-03 MED ORDER — ISOSORB DINITRATE-HYDRALAZINE 20-37.5 MG PO TABS
0.5000 | ORAL_TABLET | Freq: Three times a day (TID) | ORAL | Status: DC
  Administered 2017-08-03 (×3): 0.5 via ORAL
  Filled 2017-08-03 (×4): qty 0.5

## 2017-08-03 MED ORDER — AMIODARONE HCL 200 MG PO TABS: 400.0000 mg | ORAL_TABLET | Freq: Every day | ORAL | Status: DC

## 2017-08-03 MED ORDER — AMIODARONE HCL IN DEXTROSE 360-4.14 MG/200ML-% IV SOLN
30.0000 mg/h | INTRAVENOUS | Status: DC
  Administered 2017-08-03 – 2017-08-04 (×2): 30 mg/h via INTRAVENOUS
  Filled 2017-08-03 (×2): qty 200

## 2017-08-03 MED ORDER — DIGOXIN 125 MCG PO TABS
0.1250 mg | ORAL_TABLET | Freq: Every day | ORAL | Status: DC
  Administered 2017-08-03 – 2017-08-05 (×3): 0.125 mg via ORAL
  Filled 2017-08-03 (×3): qty 1

## 2017-08-03 MED ORDER — MAGNESIUM SULFATE 2 GM/50ML IV SOLN
2.0000 g | Freq: Once | INTRAVENOUS | Status: AC
  Administered 2017-08-03: 2 g via INTRAVENOUS
  Filled 2017-08-03: qty 50

## 2017-08-03 MED ORDER — SODIUM CHLORIDE 0.9 % IV SOLN: INTRAVENOUS | Status: DC | PRN

## 2017-08-03 MED ORDER — FUROSEMIDE 40 MG PO TABS
40.0000 mg | ORAL_TABLET | Freq: Every day | ORAL | Status: DC
  Administered 2017-08-03 – 2017-08-04 (×2): 40 mg via ORAL
  Filled 2017-08-03 (×2): qty 1

## 2017-08-03 NOTE — Plan of Care (Signed)
  Problem: Clinical Measurements: Goal: Ability to maintain clinical measurements within normal limits will improve Outcome: Progressing Goal: Diagnostic test results will improve Outcome: Progressing Goal: Respiratory complications will improve Outcome: Progressing Goal: Cardiovascular complication will be avoided Outcome: Progressing   Problem: Activity: Goal: Risk for activity intolerance will decrease Outcome: Progressing Note:  Patient tolerated sitting in chair all day.   Problem: Elimination: Goal: Will not experience complications related to bowel motility Outcome: Progressing Goal: Will not experience complications related to urinary retention Outcome: Progressing   Problem: Activity: Goal: Ability to return to baseline activity level will improve Outcome: Progressing   Problem: Neurologic: Goal: Promote progressive neurologic recovery Outcome: Progressing   Problem: Coping: Goal: Level of anxiety will decrease Outcome: Not Progressing

## 2017-08-03 NOTE — Progress Notes (Signed)
Subjective:  Patient complains of vague left-sided pleuritic pain.  No anginal chest pain or feels better, more awake and alert.  No further episodes of V. Tach.  Patient off lidocaine and Levophed and IV dobutamine is being weaned off Objective:  Vital Signs in the last 24 hours: Temp:  [98.3 F (36.8 C)-98.8 F (37.1 C)] 98.5 F (36.9 C) (05/08 0700) Pulse Rate:  [80-104] 102 (05/08 0800) Resp:  [17-27] 20 (05/08 0800) BP: (95-157)/(66-89) 117/86 (05/08 0800) SpO2:  [94 %-100 %] 98 % (05/08 0800) Arterial Line BP: (122-174)/(54-79) 135/60 (05/07 1600)  Intake/Output from previous day: 05/07 0701 - 05/08 0700 In: 1102 [I.V.:852; IV Piggyback:250] Out: 5875 [Urine:5875] Intake/Output from this shift: Total I/O In: 189.8 [P.O.:120; I.V.:19.8; IV Piggyback:50] Out: 300 [Urine:300]  Physical Exam: Neck: no adenopathy, no carotid bruit, no JVD and supple, symmetrical, trachea midline Lungs: clear to auscultation bilaterally Heart: regular rate and rhythm, S1, S2 normal and soft systolic murmur noted.  No pericardial rub Abdomen: soft, non-tender; bowel sounds normal; no masses,  no organomegaly  Lab Results: Recent Labs    08/02/17 0328 08/03/17 0316  WBC 11.2* 7.5  HGB 10.4* 9.7*  PLT 228 175   Recent Labs    08/02/17 1322 08/03/17 0316  NA 139 139  K 3.6 3.4*  CL 106 105  CO2 23 25  GLUCOSE 83 97  BUN 24* 20  CREATININE 2.23* 2.14*   Recent Labs    08/01/17 1826 08/02/17 0328  TROPONINI 27.47* 19.74*   Hepatic Function Panel Recent Labs    08/03/17 0316  PROT 6.5  ALBUMIN 3.3*  AST 144*  ALT 202*  ALKPHOS 49  BILITOT 1.3*   No results for input(s): CHOL in the last 72 hours. No results for input(s): PROTIME in the last 72 hours.  Imaging: Imaging results have been reviewed and Dg Chest Port 1 View  Result Date: 08/02/2017 CLINICAL DATA:  Evaluate endotracheal tube, respiratory failure EXAM: PORTABLE CHEST 1 VIEW COMPARISON:  Portable chest x-ray  of 08/01/2017 FINDINGS: The tip of the endotracheal tube is approximately 3.3 cm above the carina. Right IJ central venous line tip overlies the expected SVC-RA junction. Minimal haziness again is noted throughout the right hemithorax with no abnormality noted in the left lung. This could represent pneumonia possibly due to aspiration and follow-up is recommended to ensure clearing. Heart is mildly enlarged and stable. No bony abnormality is seen. IMPRESSION: 1. Tip of endotracheal tube 3.3 cm above carina. 2. Some haziness remains throughout the right lung which could be due to pneumonia possibly aspiration and follow-up is recommended Electronically Signed   By: Dwyane Dee M.D.   On: 08/02/2017 09:52    Cardiac Studies:  Assessment/Plan:  Status post out of hospital V. Fib cardiac arrest. 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 Status post recurrent V. Fib cardiac arrest. Cardiogenic shock. Ischemic cardiomyopathy, history of inferior wall MI in the past, status post PCI to distal RCA, PDA and PLV branch in [place] requiring multiple stents. Hypertension. Elevated blood sugar, rule out diabetes. Acute on chronic kidney injury, multifactorial. Possible aspiration pneumonia Plan Wean off IV dobutamine. Start amiodarone 400 mg by mouth daily Phase I cardiac rehabilitation. Out of bed to chair Will start Coreg 3.125 mg twice daily if blood pressure tolerates   LOS: 4 days    Rinaldo Cloud 08/03/2017, 9:00 AM

## 2017-08-03 NOTE — Progress Notes (Signed)
PCCM Progress Note  Admission date: 07/30/2017 Referring provider: Dr. Dalene Seltzer, ER CC: Cardiac arrest  HPI: 42 yo male had VF cardiac arrest at home. PMHx of HTN, CAD, HLD.  Subjective: Has chest discomfort from CPR.  Breathing okay.  Vital signs: BP 127/88   Pulse (!) 102   Temp 98.5 F (36.9 C) (Oral)   Resp 20   Ht 6' (1.829 m)   Wt 228 lb 2.5 oz (103.5 kg)   SpO2 98%   BMI 30.94 kg/m   Intake/outpt: I/O last 3 completed shifts: In: 2398.8 [I.V.:1718.8; NG/GT:120; IV Piggyback:560] Out: 7000 [Urine:5895; Emesis/NG output:1105]  Physical exam:  General - pleasant Eyes - pupils reactive ENT - no sinus tenderness, no oral exudate, no LAN Cardiac - regular, no murmur Chest - no wheeze, rales Abd - soft, non tender Ext - no edema Skin - no rashes Neuro - normal strength Psych - normal mood   CBC Recent Labs    08/01/17 0356  08/01/17 2007 08/02/17 0328 08/03/17 0316  WBC 9.2  --   --  11.2* 7.5  HGB 11.7*   < > 11.9* 10.4* 9.7*  HCT 33.9*   < > 35.0* 30.3* 28.5*  PLT 225  --   --  228 175   < > = values in this interval not displayed.    Coag's Recent Labs    07/31/17 1058 08/01/17 0624  APTT 40*  --   INR  --  1.40    BMET Recent Labs    08/02/17 0328 08/02/17 1322 08/03/17 0316  NA 134* 139 139  K 5.2* 3.6 3.4*  CL 107 106 105  CO2 16* 23 25  BUN 27* 24* 20  CREATININE 2.38* 2.23* 2.14*  GLUCOSE 198* 83 97    Electrolytes Recent Labs    08/01/17 0356 08/02/17 0328 08/02/17 1322 08/03/17 0316  CALCIUM 6.7* 6.9* 7.1* 7.7*  MG 2.0 1.6*  --  1.8  PHOS 3.0 3.6  --  2.2*    Sepsis Markers Recent Labs    08/01/17 0356 08/02/17 0328  PROCALCITON 5.04 2.61    ABG Recent Labs    08/01/17 0633 08/01/17 0925 08/02/17 0325  PHART 7.338* 7.482* 7.429  PCO2ART 30.2* 25.9* 26.0*  PO2ART 87.0 278.0* 145*    Liver Enzymes Recent Labs    08/01/17 0356 08/02/17 0328 08/03/17 0316  AST 219* 165* 144*  ALT 335* 259*  202*  ALKPHOS 46 48 49  BILITOT 0.6 0.8 1.3*  ALBUMIN 2.6* 2.9* 3.3*    Cardiac Enzymes Recent Labs    08/01/17 1321 08/01/17 1826 08/02/17 0328  TROPONINI 28.80* 27.47* 19.74*    Glucose Recent Labs    08/02/17 1208 08/02/17 1605 08/02/17 1928 08/02/17 2338 08/03/17 0332 08/03/17 0730  GLUCAP 82 112* 80 85 89 104*    Imaging Dg Chest Port 1 View  Result Date: 08/02/2017 CLINICAL DATA:  Evaluate endotracheal tube, respiratory failure EXAM: PORTABLE CHEST 1 VIEW COMPARISON:  Portable chest x-ray of 08/01/2017 FINDINGS: The tip of the endotracheal tube is approximately 3.3 cm above the carina. Right IJ central venous line tip overlies the expected SVC-RA junction. Minimal haziness again is noted throughout the right hemithorax with no abnormality noted in the left lung. This could represent pneumonia possibly due to aspiration and follow-up is recommended to ensure clearing. Heart is mildly enlarged and stable. No bony abnormality is seen. IMPRESSION: 1. Tip of endotracheal tube 3.3 cm above carina. 2. Some haziness remains throughout the right  lung which could be due to pneumonia possibly aspiration and follow-up is recommended Electronically Signed   By: Dwyane Dee M.D.   On: 08/02/2017 09:52    Studies: LHC 5/04 Echo 5/05 >> EF 30 to 35% CT head 5/05 >> no acute findings  Cultures: Sputum 5/05 >> oral flora  Antibiotics: Zosyn 5/04 >> 5/08  Events: 5/06 VF/PEA/Asystolic with ROSC in 6 minutes 5/07 Off pressors 5/08 Off lidocaine  Lines/tubes: ETT 5/04 >> 5/07 Rt IJ CVL 5/05 >>  Discussion: 42 yo with VF arrest with STEMI with hx of CAD, HTN, HLD.  Had recurrent cardiac arrest this AM.  Assessment/plan:  Acute hypoxic respiratory failure. - resolved  VF cardiac arrest. Cardiogenic shock >> resolved. STEMI with CAD. Acute systolic CHF. HLD. - dobutamine, amiodarone per cardiology - continue ASA, lipitor, brilinta - EP cardiology assessing for  defibrillator  Aspiration pneumonia. - d/c ABx after dose on 5/08  Acute anoxic/metabolic encephalopathy. - monitor mental status  Acute renal failure 2nd to ATN. Hypokalemia. Hypomagnesemia. - monitor renal fx - replace electrolytes as needed  Hyperglycemia. - improved - d/c SSI  DVT prophylaxis - lovenox SUP - d/c pepcid >> no longer indicated Nutrition - heart healthy diet Goals of care - full code  Will ask cardiology to assume primary care from 5/09 and PCCM off.  Coralyn Helling, MD [facility] Pulmonary/Critical Care 08/03/2017, 9:51 AM

## 2017-08-03 NOTE — Progress Notes (Signed)
Dobutamine decreased to per Harwani. Amio gtt continued per Mclean. RN will continue to monitor.

## 2017-08-03 NOTE — Progress Notes (Signed)
EKG CRITICAL VALUE     12 lead EKG performed.  Critical value noted.  Smitty Pluck RN notified.   Broox Lonigro, CCT 08/03/2017 7:55 AM

## 2017-08-03 NOTE — Care Management Note (Addendum)
Case Management Note Donn Pierini RN, BSN Unit 4E-Case Manager- 2H coverage [phone]  Patient Details  Name: Lee Moore MRN: [id number] Date of Birth: 03/12/1976  Subjective/Objective:    Pt admitted s/p cardiac arrest with Vfib                Action/Plan: PTA pt lived at home- independent- pt on Brilinta- per insurance check copay $50- pt now off vent- spoke with pt at bedside regarding Brilinta- per pt he has been on drug for about a year- has been able to afford copay cost- has not been using copay assist card- provided pt with copay assist card to use on discharge to see if it will help reduce the copay cost for him- pt states that he uses [facility]- PT eval pending- CM to continue to follow for any further transition of care needs.  1445- noted plans for [employer]- have spoken with Rosanne Ashing at [place]- will need [employer] order form filled out and submitted for insurance approval prior to discharge- Rosanne Ashing with [facility] to f/u with Dr. Sharyn Lull.   Expected Discharge Date:                  Expected Discharge Plan:     In-House Referral:  NA  Discharge planning Services  CM Consult, Medication Assistance  Post Acute Care Choice:    Choice offered to:     DME Arranged:    DME Agency:     HH Arranged:    HH Agency:     Status of Service:  In process, will continue to follow  If discussed at Long Length of Stay Meetings, dates discussed:    Discharge Disposition:   Additional Comments:  Darrold Span, RN 08/03/2017, 2:22 PM

## 2017-08-03 NOTE — Progress Notes (Addendum)
Progress Note  Patient Name: Lee Moore Date of Encounter: 08/03/2017  Primary Cardiologist: Dr. Terrence Dupont   Subjective   No active complaints, no CP or SOB  Inpatient Medications    Scheduled Meds: . amiodarone  400 mg Oral Daily  . aspirin  81 mg Oral Daily  . atorvastatin  80 mg Oral q1800  . chlorhexidine gluconate (MEDLINE KIT)  15 mL Mouth Rinse BID  . Chlorhexidine Gluconate Cloth  6 each Topical Daily  . digoxin  0.125 mg Oral Daily  . enoxaparin (LOVENOX) injection  40 mg Subcutaneous Q24H  . furosemide  40 mg Oral Daily  . insulin aspart  1-3 Units Subcutaneous Q4H  . isosorbide-hydrALAZINE  0.5 tablet Oral TID  . potassium chloride  40 mEq Oral Q4H  . sodium chloride flush  10-40 mL Intracatheter Q12H  . sodium chloride flush  3 mL Intravenous Q12H  . spironolactone  12.5 mg Oral Daily  . ticagrelor  90 mg Oral BID   Continuous Infusions: . sodium chloride Stopped (07/30/17 0000)  . DOBUTamine 1 mcg/kg/min (08/03/17 0821)  . famotidine (PEPCID) IV Stopped (08/02/17 2140)  . piperacillin-tazobactam (ZOSYN)  IV Stopped (08/03/17 0736)   PRN Meds: acetaminophen **OR** acetaminophen, albuterol, ondansetron (ZOFRAN) IV, oxyCODONE-acetaminophen, sodium chloride flush, sodium chloride flush   Vital Signs    Vitals:   08/03/17 0500 08/03/17 0600 08/03/17 0700 08/03/17 0800  BP: 95/72 102/83 129/87 117/86  Pulse:  98 99 (!) 102  Resp: (!) 24 17 20 20   Temp:   98.5 F (36.9 C)   TempSrc:   Oral   SpO2: 99% 100% 100% 98%  Weight:      Height:        Intake/Output Summary (Last 24 hours) at 08/03/2017 0936 Last data filed at 08/03/2017 0800 Gross per 24 hour  Intake 1291.78 ml  Output 5150 ml  Net -3858.22 ml   Filed Weights   07/30/17 1934 07/31/17 0410  Weight: 225 lb (102.1 kg) 228 lb 2.5 oz (103.5 kg)    Telemetry    SR, no arrhythmias - Personally Reviewed  ECG    No new EKGs - Personally Reviewed  Physical Exam   GEN: No acute distress.     Neck: No JVD Cardiac: RRR, no murmurs, rubs, or gallops.  Respiratory: diminished at the bases GI: Soft, nontender, non-distended  MS: trace edema; No deformity. Neuro:  Nonfocal  Psych: Normal affect   Labs    Chemistry Recent Labs  Lab 08/01/17 0356  08/02/17 0328 08/02/17 1322 08/03/17 0316  NA 137   < > 134* 139 139  K 3.8   < > 5.2* 3.6 3.4*  CL 109   < > 107 106 105  CO2 18*  --  16* 23 25  GLUCOSE 125*   < > 198* 83 97  BUN 21*   < > 27* 24* 20  CREATININE 1.85*   < > 2.38* 2.23* 2.14*  CALCIUM 6.7*  --  6.9* 7.1* 7.7*  PROT 5.3*  --  5.5*  --  6.5  ALBUMIN 2.6*  --  2.9*  --  3.3*  AST 219*  --  165*  --  144*  ALT 335*  --  259*  --  202*  ALKPHOS 46  --  48  --  49  BILITOT 0.6  --  0.8  --  1.3*  GFRNONAA 43*  --  32* 35* 36*  GFRAA 50*  --  37* 40* 42*  ANIONGAP 10  --  11 10 9    < > = values in this interval not displayed.     Hematology Recent Labs  Lab 08/01/17 0356  08/01/17 2007 08/02/17 0328 08/03/17 0316  WBC 9.2  --   --  11.2* 7.5  RBC 3.90*  --   --  3.41* 3.17*  HGB 11.7*   < > 11.9* 10.4* 9.7*  HCT 33.9*   < > 35.0* 30.3* 28.5*  MCV 86.9  --   --  88.9 89.9  MCH 30.0  --   --  30.5 30.6  MCHC 34.5  --   --  34.3 34.0  RDW 13.7  --   --  14.0 14.0  PLT 225  --   --  228 175   < > = values in this interval not displayed.    Cardiac Enzymes Recent Labs  Lab 08/01/17 0905 08/01/17 1321 08/01/17 1826 08/02/17 0328  TROPONINI 21.89* 28.80* 27.47* 19.74*    Recent Labs  Lab 07/30/17 1938  TROPIPOC 11.86*     BNPNo results for input(s): BNP, PROBNP in the last 168 hours.   DDimer No results for input(s): DDIMER in the last 168 hours.   Radiology    Dg Chest Port 1 View Result Date: 08/02/2017 CLINICAL DATA:  Evaluate endotracheal tube, respiratory failure EXAM: PORTABLE CHEST 1 VIEW COMPARISON:  Portable chest x-ray of 08/01/2017 FINDINGS: The tip of the endotracheal tube is approximately 3.3 cm above the carina. Right IJ  central venous line tip overlies the expected SVC-RA junction. Minimal haziness again is noted throughout the right hemithorax with no abnormality noted in the left lung. This could represent pneumonia possibly due to aspiration and follow-up is recommended to ensure clearing. Heart is mildly enlarged and stable. No bony abnormality is seen. IMPRESSION: 1. Tip of endotracheal tube 3.3 cm above carina. 2. Some haziness remains throughout the right lung which could be due to pneumonia possibly aspiration and follow-up is recommended Electronically Signed   By: Ivar Drape M.D.   On: 08/02/2017 09:52    Cardiac Studies   07/31/17: TTE Study Conclusions - Left ventricle: The cavity size was normal. Systolic function was moderately to severely reduced. The estimated ejection fraction was in the range of 30% to 35%. Severe hypokinesis of the basal-midinferior myocardium. - Atrial septum: No defect or patent foramen ovale was identified.  07/30/17: (19:49) LHC/PCI  Ost LM lesion is 20% stenosed.  Ost LAD to Prox LAD lesion is 20% stenosed.  Prox RCA to Mid RCA lesion is 100% stenosed.  Dist RCA lesion is 100% stenosed.  Post Atrio lesion is 100% stenosed.  Ost RPDA to RPDA lesion is 100% stenosed.  Post intervention, there is a 0% residual stenosis.  A drug-eluting stent was successfully placed using a STENT SIERRA 3.50 X 38 MM.  Patient Profile     42 y.o. male with a hx of CAD w/prior IWMI 2017 (reportedly 5 stents were placed then), HTN, HLD.  Admitted to [facility] 07/30/17, he was at a friend's house when he suddenly collapsed.  EMS was called, found in VF, required multiple defibrillations, intubated in the field, CPR, by record ROSC was obtained in 10-15 minutes >>  A. fib with RVR with Q waves in inferior leads with ST elevation and reciprocal ST depression in high lateral leads, he required a number of shocks in the ER as well for recurrent VT.  Code STEMI was called and he was taken  emergently  to the cath lab, hypothermia protocol was initiated as well, requiring pressor support, and was on amiodarone gtt after required a shock for VT at the onset of his cath procedure.  The morning of 08/01/17 he suffered another VF arrest, had approximately 6 minutes of CPR, one shock/ACLS.  He was already on amiodarone gtt, lidocaine gtt was added.  He was not taken back to the cath lab.  Assessment & Plan    1. Recurrent  Arrest     <48 hours post STEMI/intervention  In review Dr. Zenia Resides note, following enzymes, planned for medical management, no planned for re-cath pending enzymes He noted that the patient had large thrombus burden and proximal and mid RCA requiring multiple balloon inflations. Aspiration thrombectomy followed by stenting with improvement in blood flow TIMI 2 to the RV branch . This vessel is not suitable for further PCI , even if it is occluded  He was not taken back to the cath lab  He is off lidocaine this AM, on Dobutrex Dr. Lovena Le in d/w Dr. Aundra Dubin and Dr. Terrence Dupont, transition to PO amiodarone 423m daily after Dobutrex is off.  Life vest for 3 months with re-peat echo at that time.  If EF remains depressed will need ICD.  Dr. HTerrence Dupontwill arrange Life vest and follow out patient, plan for the echo.  Dr.Taylor discussed with patient events, and life vest with possibility of ICD in the future pending extent of LV recovery EP will see in a few weeks to follow up rhythm.   Continue care with Dr. HTerrence Dupontand AHF team, EP will sign off, though remain available.  Please recall if needed.     For questions or updates, please contact CShannon HillsPlease consult [email or web address] for contact info under Cardiology/STEMI.      Signed, RBaldwin Jamaica PA-C  08/03/2017, 9:36 AM    EP attending  Patient seen and examined.  Agree with the findings as noted above.  The patient neurologically is much improved in the last 48 hours.  He has been extubated and had  no ventricular arrhythmias.  He denies anginal symptoms.  He is doing well on intravenous amiodarone and his intravenous lidocaine has been discontinued.  His exam demonstrates a pleasant young overweight man in no distress.  Vitals are as above.  Lungs are clear bilaterally.  Cardiac vascular exam reveals a regular rate and rhythm and his heart sounds are somewhat distant.  Extremities are warm.  Telemetry demonstrates sinus rhythm with occasional PVCs. Assessment and plan 1.  Ventricular fibrillation -all of his episodes of been in the setting of an acute myocardial infarction although the second episode was nearly 45 hours after his initial event.  I discussed the treatment options with Dr. HTerrence Dupontand Dr. MAlgernon Huxley  For now, we will continue's medical therapy.  I would recommend a LifeVest.  3 months after his initial event, repeat echo would be warranted.  If his ejection fraction is less than 35%, ICD would be recommended.  He will need to be treated with maximal medical therapy and amiodarone.  Consider holding digoxin but I will defer to his primary cardiologist. 2.  Coronary artery disease -results of the heart catheterization were reviewed and discussed.  He probably has low-level ischemia in the inferior wall.  He will require beta-blocker therapy.  GCristopher Peru MD

## 2017-08-03 NOTE — Progress Notes (Signed)
Pt anxious and asking for xanax. Pt states, "Im scared, I have to take care of my two kids." Craige Cotta, MD paged verbal order for xanax 0.25 Q6H PRN. Emotional support given, will administer med and continue to monitor closely.  Delories Heinz, RN

## 2017-08-03 NOTE — Progress Notes (Addendum)
Patient ID: Lee Moore, male   DOB: [birth year], 42 y.o.   MRN: [id number]     Advanced Heart Failure Rounding Note  PCP-Cardiologist: No primary care provider on file.   Subjective:    Extubated 08/02/17  Remains on dobutamine 2 and lidocaine 0.5. Amio at 30.   Coox 53.1% this am.   Feeling better this am. Slight hematuria (foley pulled yesterday). Denies SOB. Has not been up to bed yet. Mild pleuritic chest pain with deep breathing.   Creatinine 2.38 -> 2.14. CVP 6-7 cm. No weights.   Objective:   Weight Range: 228 lb 2.5 oz (103.5 kg) Body mass index is 30.94 kg/m.   Vital Signs:   Temp:  [98.3 F (36.8 C)-98.8 F (37.1 C)] 98.3 F (36.8 C) (05/08 0336) Pulse Rate:  [69-104] 99 (05/08 0700) Resp:  [17-27] 20 (05/08 0700) BP: (95-157)/(66-97) 129/87 (05/08 0700) SpO2:  [89 %-100 %] 100 % (05/08 0700) Arterial Line BP: (122-175)/(54-80) 135/60 (05/07 1600) FiO2 (%):  [40 %] 40 % (05/07 0755) Last BM Date: 08/02/17  Weight change: Filed Weights   07/30/17 1934 07/31/17 0410  Weight: 225 lb (102.1 kg) 228 lb 2.5 oz (103.5 kg)   Intake/Output:   Intake/Output Summary (Last 24 hours) at 08/03/2017 0718 Last data filed at 08/03/2017 0700 Gross per 24 hour  Intake 1101.98 ml  Output 5875 ml  Net -4773.02 ml     Physical Exam    CVP 6-7  General: Fatigued appearing. NAD.  HEENT: Normal Neck: Supple. JVP 6-7 cm. Carotids 2+ bilat; no bruits. No thyromegaly or nodule noted. Cor: PMI nondisplaced. RRR, No M/G/R noted Lungs: CTAB, normal effort. Abdomen: Soft, non-tender, non-distended, no HSM. No bruits or masses. +BS  Extremities: No cyanosis, clubbing, or rash. R and LLE no edema.  Neuro: Alert & orientedx3, cranial nerves grossly intact. moves all 4 extremities w/o difficulty. Affect pleasant   Telemetry   NSR, personally reviewed.   Labs    CBC Recent Labs    08/02/17 0328 08/03/17 0316  WBC 11.2* 7.5  NEUTROABS 9.7* 5.6  HGB 10.4* 9.7*  HCT 30.3*  28.5*  MCV 88.9 89.9  PLT 228 017   Basic Metabolic Panel Recent Labs    08/02/17 0328 08/02/17 1322 08/03/17 0316  NA 134* 139 139  K 5.2* 3.6 3.4*  CL 107 106 105  CO2 16* 23 25  GLUCOSE 198* 83 97  BUN 27* 24* 20  CREATININE 2.38* 2.23* 2.14*  CALCIUM 6.9* 7.1* 7.7*  MG 1.6*  --  1.8  PHOS 3.6  --  2.2*   Liver Function Tests Recent Labs    08/02/17 0328 08/03/17 0316  AST 165* 144*  ALT 259* 202*  ALKPHOS 48 49  BILITOT 0.8 1.3*  PROT 5.5* 6.5  ALBUMIN 2.9* 3.3*   No results for input(s): LIPASE, AMYLASE in the last 72 hours. Cardiac Enzymes Recent Labs    08/01/17 1321 08/01/17 1826 08/02/17 0328  TROPONINI 28.80* 27.47* 19.74*    BNP: BNP (last 3 results) No results for input(s): BNP in the last 8760 hours.  ProBNP (last 3 results) No results for input(s): PROBNP in the last 8760 hours.   D-Dimer No results for input(s): DDIMER in the last 72 hours. Hemoglobin A1C No results for input(s): HGBA1C in the last 72 hours. Fasting Lipid Panel No results for input(s): CHOL, HDL, LDLCALC, TRIG, CHOLHDL, LDLDIRECT in the last 72 hours. Thyroid Function Tests No results for input(s): TSH, T4TOTAL, T3FREE, THYROIDAB  in the last 72 hours.  Invalid input(s): FREET3  Other results:   Imaging    No results found.   Medications:     Scheduled Medications: . aspirin  81 mg Oral Daily  . atorvastatin  80 mg Oral q1800  . chlorhexidine gluconate (MEDLINE KIT)  15 mL Mouth Rinse BID  . Chlorhexidine Gluconate Cloth  6 each Topical Daily  . enoxaparin (LOVENOX) injection  40 mg Subcutaneous Q24H  . insulin aspart  1-3 Units Subcutaneous Q4H  . isosorbide mononitrate  30 mg Oral Daily  . sodium chloride flush  10-40 mL Intracatheter Q12H  . sodium chloride flush  3 mL Intravenous Q12H  . spironolactone  12.5 mg Oral Daily  . ticagrelor  90 mg Oral BID    Infusions: . sodium chloride Stopped (07/30/17 0000)  . amiodarone 30 mg/hr (08/02/17  2000)  . DOBUTamine 2 mcg/kg/min (08/02/17 2000)  . epinephrine Stopped (07/31/17 0136)  . famotidine (PEPCID) IV Stopped (08/02/17 2140)  . lidocaine 0.5 mg/min (08/02/17 2000)  . norepinephrine (LEVOPHED) Adult infusion Stopped (08/02/17 1142)  . piperacillin-tazobactam (ZOSYN)  IV 3.375 g (08/03/17 0311)    PRN Medications: acetaminophen **OR** acetaminophen, albuterol, ondansetron (ZOFRAN) IV, oxyCODONE-acetaminophen, sodium chloride flush, sodium chloride flush   Assessment/Plan   1. CAD: Inferior STEMI.  Patient has history of MI in 2017, per report had a total of 5 stents in RCA system from the past.  MI this admission was associated with multiple episodes pulseless VT. To cath lab 5/4, found to have occluded RCA, relatively clear left coronary system.The patient had DES to proximal-mid RCA, restoring flow to an acute marginal.  However, the distal RCA/PLV/PDA remained occluded.  There were collaterals to the PLV from the left. - Continue ASA 81, ticagrelor, atorvastatin 80 daily.  - No further revascularization option currently, discussed films with Dr. Burt Knack.  - No change to current plan.   2. VF/VT arrest: Patient had pulseless VT initially at presentation and again in the cath lab.  Early 5/6 am he had VF arrest and was again cardioverted.  He was down > 15 minutes with initial arrest prior to ROSC. He has undergone hypothermia protocol and has been rewarmed.  He has been noted to follow commands.  - Continue amiodarone 30 mg/hr.   - Stop lidocaine.  - So far, ventricular arrhythmias have been within 48 hrs of PCI.  If no further VT.  Will send home on Lifevest.  Given lack of revascularization to most of RCA, would repeat echo in 4-6 week for ICD consideration.  3. Cardiogenic shock: Ischemic cardiomyopathy, EF 30-35% with inferior WMA on echo this admission.  RV looks ok. He now off norepinephrine, dobutamine reduced to 2. CVP 6-7 after good diuresis.  - Coox 53.1% on dobutamine  2.0 mcg/kg/min.  - Repeat Coox. If stable, can wean dobutamine. - Transition to po Lasix.  - Add Bidil 1/2 tab tid.  - Continue spironolactone 12.5 daily.  4. Neurological: - He is now awake and speaking.  5. ID: - Possible aspiration PNA.  Covering with Zosyn.  6. AKI:  - Creatinine 2.38 -> 2.14 - Follow. Unclear baseline.  7. Hematuria - Likely from foley. Follow.   Out of bed to chair, will get PT.    Length of Stay: [address]  Annamaria Helling  08/03/2017, 7:18 AM  Advanced Heart Failure Team Pager [phone] (M-F; 7a - 4p)  Please contact [facility] for night-coverage after hours (4p -7a ) and  weekends on [email or web address]  Patient seen with PA, agree with the above note.  He is awake and alert this morning.  Has some pleuritic chest pain.  No further arrhythmias.   On exam, mild crackles at bases on lungs, regular rate/rhythm, no JVD, no edema.   Doing well today.  No further arrhythmias.  Today, we will stop lidocaine.  Continue amiodarone gtt.   CVP down to 6-7, transition Lasix to po.   Co-ox 53% in the early am, will resend.  If repeat is ok, will start to wean down dobutamine.  Add Bidil 1/2 tab tid and spironolactone 12.5 daily.   It appears that there is no further good revascularization option for the RCA.  Given incomplete revascularization + VT, would have him wear Lifevest at discharge with repeat echo at 4-6 weeks.  If EF < 35% at that time, will need ICD.   Loralie Champagne 08/03/2017 7:57 AM

## 2017-08-04 DIAGNOSIS — I5043 Acute on chronic combined systolic (congestive) and diastolic (congestive) heart failure: Secondary | ICD-10-CM

## 2017-08-04 LAB — COMPREHENSIVE METABOLIC PANEL
ALT: 170 U/L — ABNORMAL HIGH (ref 17–63)
AST: 118 U/L — ABNORMAL HIGH (ref 15–41)
Albumin: 3.3 g/dL — ABNORMAL LOW (ref 3.5–5.0)
Alkaline Phosphatase: 57 U/L (ref 38–126)
Anion gap: 8 (ref 5–15)
BUN: 18 mg/dL (ref 6–20)
CHLORIDE: 107 mmol/L (ref 101–111)
CO2: 24 mmol/L (ref 22–32)
Calcium: 8.7 mg/dL — ABNORMAL LOW (ref 8.9–10.3)
Creatinine, Ser: 1.64 mg/dL — ABNORMAL HIGH (ref 0.61–1.24)
GFR, EST AFRICAN AMERICAN: 58 mL/min — AB (ref >60)
GFR, EST NON AFRICAN AMERICAN: 50 mL/min — AB (ref >60)
Glucose, Bld: 122 mg/dL — ABNORMAL HIGH (ref 65–99)
POTASSIUM: 4 mmol/L (ref 3.5–5.1)
Sodium: 139 mmol/L (ref 135–145)
TOTAL PROTEIN: 6.8 g/dL (ref 6.5–8.1)
Total Bilirubin: 1.5 mg/dL — ABNORMAL HIGH (ref 0.3–1.2)

## 2017-08-04 LAB — GLUCOSE, CAPILLARY
GLUCOSE-CAPILLARY: 86 mg/dL (ref 65–99)
Glucose-Capillary: 103 mg/dL — ABNORMAL HIGH (ref 65–99)

## 2017-08-04 LAB — COOXEMETRY PANEL
CARBOXYHEMOGLOBIN: 1.5 % (ref 0.5–1.5)
CARBOXYHEMOGLOBIN: 1.1 % (ref 0.5–1.5)
METHEMOGLOBIN: 1 % (ref 0.0–1.5)
Methemoglobin: 1.5 % (ref 0.0–1.5)
O2 SAT: 59.7 %
O2 SAT: 66.1 %
TOTAL HEMOGLOBIN: 10.3 g/dL — AB (ref 12.0–16.0)
TOTAL HEMOGLOBIN: 11.9 g/dL — AB (ref 12.0–16.0)

## 2017-08-04 LAB — CBC
HEMATOCRIT: 30.2 % — AB (ref 39.0–52.0)
HEMOGLOBIN: 10.3 g/dL — AB (ref 13.0–17.0)
MCH: 30.9 pg (ref 26.0–34.0)
MCHC: 34.1 g/dL (ref 30.0–36.0)
MCV: 90.7 fL (ref 78.0–100.0)
Platelets: 209 10*3/uL (ref 150–400)
RBC: 3.33 MIL/uL — ABNORMAL LOW (ref 4.22–5.81)
RDW: 14.2 % (ref 11.5–15.5)
WBC: 7.9 10*3/uL (ref 4.0–10.5)

## 2017-08-04 LAB — MAGNESIUM: MAGNESIUM: 2.5 mg/dL — AB (ref 1.7–2.4)

## 2017-08-04 MED ORDER — ISOSORB DINITRATE-HYDRALAZINE 20-37.5 MG PO TABS
1.0000 | ORAL_TABLET | Freq: Three times a day (TID) | ORAL | Status: DC
  Administered 2017-08-04 – 2017-08-06 (×7): 1 via ORAL
  Filled 2017-08-04 (×7): qty 1

## 2017-08-04 MED ORDER — AMIODARONE HCL 200 MG PO TABS
200.0000 mg | ORAL_TABLET | Freq: Two times a day (BID) | ORAL | Status: DC
  Administered 2017-08-04 – 2017-08-06 (×5): 200 mg via ORAL
  Filled 2017-08-04 (×5): qty 1

## 2017-08-04 MED FILL — Medication: Qty: 1 | Status: AC

## 2017-08-04 NOTE — Progress Notes (Signed)
Subjective:  Doing well denies any anginal chest pain or shortness of breath was up in chair yesterday.no further episodes of V. Tach.  Objective:  Vital Signs in the last 24 hours: Temp:  [98.2 F (36.8 C)-99.2 F (37.3 C)] 98.3 F (36.8 C) (05/09 0700) Pulse Rate:  [84-109] 95 (05/09 0800) Resp:  [14-28] 21 (05/09 0800) BP: (102-144)/(65-102) 125/80 (05/09 0800) SpO2:  [89 %-100 %] 99 % (05/09 0800) Weight:  [95 kg (209 lb 7 oz)] 95 kg (209 lb 7 oz) (05/09 0500)  Intake/Output from previous day: 05/08 0701 - 05/09 0700 In: 798.9 [P.O.:120; I.V.:528.9; IV Piggyback:150] Out: 1300 [Urine:1300] Intake/Output from this shift: Total I/O In: 18.3 [I.V.:18.3] Out: -   Physical Exam: Neck: no adenopathy, no carotid bruit, no JVD and supple, symmetrical, trachea midline Lungs: decreased breath sound at bases with good air entry Heart: regular rate and rhythm, S1, S2 normal and soft systolic murmur noted no S3 gallop no pericardial rub Abdomen: soft, non-tender; bowel sounds normal; no masses,  no organomegaly Extremities: extremities normal, atraumatic, no cyanosis or edema  Lab Results: Recent Labs    08/03/17 0316 08/04/17 0323  WBC 7.5 7.9  HGB 9.7* 10.3*  PLT 175 209   Recent Labs    08/03/17 0316 08/04/17 0323  NA 139 139  K 3.4* 4.0  CL 105 107  CO2 25 24  GLUCOSE 97 122*  BUN 20 18  CREATININE 2.14* 1.64*   Recent Labs    08/01/17 1826 08/02/17 0328  TROPONINI 27.47* 19.74*   Hepatic Function Panel Recent Labs    08/04/17 0323  PROT 6.8  ALBUMIN 3.3*  AST 118*  ALT 170*  ALKPHOS 57  BILITOT 1.5*   No results for input(s): CHOL in the last 72 hours. No results for input(s): PROTIME in the last 72 hours.  Imaging: Imaging results have been reviewed and No results found.  Cardiac Studies:  Assessment/Plan:  Status post out of hospital V. Fib cardiac arrest. 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 Cardiogenic shock. Ischemic cardiomyopathy, history of inferior wall MI in the past, status post PCI to distal RCA, PDA and PLV branch in [place] requiring multiple stents. Hypertension. Elevated blood sugar, rule out diabetes. Acute on chronic kidney injury, multifactorial. Plan Agree with adding Coreg and entresto once renal function improves. Phase I cardiac rehabilitation Okay to transfer to stepdown LifeVest is being arranged    LOS: 5 days    Lee Moore 08/04/2017, 8:44 AM

## 2017-08-04 NOTE — Progress Notes (Signed)
CARDIAC REHAB PHASE I   PRE:  Rate/Rhythm: 94 SR    BP: sitting 108/69    SaO2: 97 RA  MODE:  Ambulation: 560 ft   POST:  Rate/Rhythm: 103 ST    BP: sitting 126/98     SaO2:   Pt able to stand and walk with min assist. Seemed steadier this pm than in am, no problems, no c/o. Denied CP. Return to recliner, diastolic elevated. Began ed including MI,stent,  Brilinta, ASA, diet, ex, HF booklet, and CRPII.  Voiced understanding. Will refer to G'sO CRPII (he did not do in [place]).  [id number]  Harriet Masson CES, ACSM 08/04/2017 2:21 PM

## 2017-08-04 NOTE — Evaluation (Signed)
Physical Therapy Evaluation Patient Details Name: Lee Moore MRN: [id number] DOB: [birth year] Today's Date: 08/04/2017   History of Present Illness  42 y.o. male admitted 5/04 after witnessed cardiac arrest at friends house, EMS called and required 6 minutes of CPR, multiple defibrillation to return to SR, intubated on field. Unresponsive in ER upon arrival, had multiple episodes of sustained V-Tach in ER requiring defibrillations to return to SR as well as emergency PCI. Extubated 5/07.  Patient has PMH  of MI in 2017, and 5 stent placements, CAD, HTN, Hyperlipidemia.      Clinical Impression  Pt admitted with above diagnosis. Pt currently with functional limitations due to the deficits listed below (see PT Problem List). 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 Upon eval, patient mobilizing very well with supervision for transfers and stand by assist for ambulation. Walked 250' this visit without AD with mild unsteadiness noted, min guard. Pt reports no increase in chest pain during session. VSS.  Pt will benefit from skilled PT to increase their independence and safety with mobility to allow discharge to the venue listed below.    Vitals: At rest semi fowler: BP 125/80  HR 87  SpO2 99% RA After Walking 250' Seated : BP 125/96   SpO2 94% RA HR 86     Follow Up Recommendations Supervision for mobility/OOB      Equipment Recommendations  None recommended by PT    Recommendations for Other Services       Precautions / Restrictions Precautions Precautions: None Restrictions Weight Bearing Restrictions: No      Mobility  Bed Mobility Overal bed mobility: Modified Independent             General bed mobility comments: Line mgt  Transfers Overall transfer level: Needs assistance Equipment used: None;1 person hand held assist Transfers: Sit to/from Stand Sit to Stand: Min guard         General transfer  comment: min guard for safety  Ambulation/Gait Ambulation/Gait assistance: Min guard;Supervision Ambulation Distance (Feet): 250 Feet Assistive device: None Gait Pattern/deviations: Staggering right;Staggering left;Step-through pattern Gait velocity: mild decrease   General Gait Details: pt with mild unsteadiness, reports "i havn' tbeen on my feet for a few days". close supervision with intermittent min guard. Pt reports no increase in chest pain or diziness this visit.   Stairs            Wheelchair Mobility    Modified Rankin (Stroke Patients Only)       Balance Overall balance assessment: Mild deficits observed, not formally tested                                           Pertinent Vitals/Pain Pain Assessment: Faces Faces Pain Scale: Hurts little more Pain Location: R sided rib pain from CPR Pain Descriptors / Indicators: Discomfort Pain Intervention(s): Limited activity within patient's tolerance;Monitored during session    Home Living Family/patient expects to be discharged to:: Private residence Living Arrangements: Spouse/significant other;Children Available Help at Discharge: Family;Available 24 hours/day Type of Home: Apartment Home Access: Elevator     Home Layout: One level Home Equipment: None      Prior Function Level of Independence: Independent         Comments: Works at a bank, 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 enjoys going  to sons baseball games     Hand Dominance        Extremity/Trunk Assessment   Upper Extremity Assessment Upper Extremity Assessment: Overall WFL for tasks assessed    Lower Extremity Assessment Lower Extremity Assessment: Overall WFL for tasks assessed       Communication   Communication: No difficulties  Cognition Arousal/Alertness: Awake/alert Behavior During Therapy: WFL for tasks assessed/performed Overall Cognitive Status: Within Functional Limits for tasks assessed                                         General Comments      Exercises General Exercises - Lower Extremity Long Arc Quad: 10 reps Straight Leg Raises: 10 reps   Assessment/Plan    PT Assessment Patient needs continued PT services  PT Problem List Decreased activity tolerance;Decreased balance;Pain       PT Treatment Interventions Functional mobility training;Therapeutic activities;Therapeutic exercise;Gait training;Stair training;Balance training    PT Goals (Current goals can be found in the Care Plan section)  Acute Rehab PT Goals Patient Stated Goal: return home when ready PT Goal Formulation: With patient Time For Goal Achievement: 08/18/17 Potential to Achieve Goals: Good    Frequency Min 3X/week   Barriers to discharge        Co-evaluation               AM-PAC PT "6 Clicks" Daily Activity  Outcome Measure Difficulty turning over in bed (including adjusting bedclothes, sheets and blankets)?: None Difficulty moving from lying on back to sitting on the side of the bed? : None Difficulty sitting down on and standing up from a chair with arms (e.g., wheelchair, bedside commode, etc,.)?: None Help needed moving to and from a bed to chair (including a wheelchair)?: A Little Help needed walking in hospital room?: A Little Help needed climbing 3-5 steps with a railing? : A Little 6 Click Score: 21    End of Session Equipment Utilized During Treatment: Gait belt Activity Tolerance: Patient tolerated treatment well Patient left: in chair;with call bell/phone within reach;with nursing/sitter in room Nurse Communication: Mobility status PT Visit Diagnosis: Pain;Unsteadiness on feet (R26.81) Pain - part of body: (Chest)    Time: 1610-9604 PT Time Calculation (min) (ACUTE ONLY): 30 min   Charges:   PT Evaluation $PT Eval Low Complexity: 1 Low PT Treatments $Gait Training: 8-22 mins   PT G Codes:        Etta Grandchild, PT, DPT Acute Rehab Services Pager:  [phone]    Etta Grandchild 08/04/2017, 9:35 AM

## 2017-08-04 NOTE — Progress Notes (Addendum)
Patient ID: Lee Moore, male   DOB: 1975-06-13, 42 y.o.   MRN: [id number]     Advanced Heart Failure Rounding Note  PCP-Cardiologist: No primary care provider on file.   Subjective:    Extubated 08/02/17  Coox 59.7% on dobutamine 1. Amio at 30.   Continues to have pleuritic chest pain. Overall feeling much better. Out of bed yesterday without difficulty. Denies lightheadedness or dizziness.   Creatinine 2.38 -> 2.14 -> 1.64. CVP 6 cm. Weight down 19 lbs from admit.   Objective:   Weight Range: 209 lb 7 oz (95 kg) Body mass index is 28.4 kg/m.   Vital Signs:   Temp:  [98.2 F (36.8 C)-99.2 F (37.3 C)] 98.2 F (36.8 C) (05/09 0442) Pulse Rate:  [84-109] 85 (05/09 0600) Resp:  [16-28] 19 (05/09 0600) BP: (102-144)/(65-95) 144/95 (05/09 0600) SpO2:  [89 %-100 %] 97 % (05/09 0600) Weight:  [209 lb 7 oz (95 kg)] 209 lb 7 oz (95 kg) (05/09 0500) Last BM Date: 08/03/17  Weight change: Filed Weights   07/30/17 1934 07/31/17 0410 08/04/17 0500  Weight: 225 lb (102.1 kg) 228 lb 2.5 oz (103.5 kg) 209 lb 7 oz (95 kg)   Intake/Output:   Intake/Output Summary (Last 24 hours) at 08/04/2017 0712 Last data filed at 08/04/2017 0600 Gross per 24 hour  Intake 780.56 ml  Output 1300 ml  Net -519.44 ml     Physical Exam    CVP 6  General: Fatigued appearing. NAD.  HEENT: Normal Neck: Supple. JVP 5-6. Carotids 2+ bilat; no bruits. No thyromegaly or nodule noted. Cor: PMI nondisplaced. RRR, No M/G/R noted Lungs: CTAB, normal effort. Abdomen: Soft, non-tender, non-distended, no HSM. No bruits or masses. +BS  Extremities: No cyanosis, clubbing, or rash. R and LLE no edema.  Neuro: Alert & orientedx3, cranial nerves grossly intact. moves all 4 extremities w/o difficulty. Affect pleasant   Telemetry   NSR, personally reviewed.   Labs    CBC Recent Labs    08/02/17 0328 08/03/17 0316 08/04/17 0323  WBC 11.2* 7.5 7.9  NEUTROABS 9.7* 5.6  --   HGB 10.4* 9.7* 10.3*  HCT  30.3* 28.5* 30.2*  MCV 88.9 89.9 90.7  PLT 228 175 209   Basic Metabolic Panel Recent Labs    40/98/11 0328  08/03/17 0316 08/04/17 0323  NA 134*   < > 139 139  K 5.2*   < > 3.4* 4.0  CL 107   < > 105 107  CO2 16*   < > 25 24  GLUCOSE 198*   < > 97 122*  BUN 27*   < > 20 18  CREATININE 2.38*   < > 2.14* 1.64*  CALCIUM 6.9*   < > 7.7* 8.7*  MG 1.6*  --  1.8 2.5*  PHOS 3.6  --  2.2*  --    < > = values in this interval not displayed.   Liver Function Tests Recent Labs    08/03/17 0316 08/04/17 0323  AST 144* 118*  ALT 202* 170*  ALKPHOS 49 57  BILITOT 1.3* 1.5*  PROT 6.5 6.8  ALBUMIN 3.3* 3.3*   No results for input(s): LIPASE, AMYLASE in the last 72 hours. Cardiac Enzymes Recent Labs    08/01/17 1321 08/01/17 1826 08/02/17 0328  TROPONINI 28.80* 27.47* 19.74*    BNP: BNP (last 3 results) No results for input(s): BNP in the last 8760 hours.  ProBNP (last 3 results) No results for input(s): PROBNP in  the last 8760 hours.   D-Dimer No results for input(s): DDIMER in the last 72 hours. Hemoglobin A1C No results for input(s): HGBA1C in the last 72 hours. Fasting Lipid Panel No results for input(s): CHOL, HDL, LDLCALC, TRIG, CHOLHDL, LDLDIRECT in the last 72 hours. Thyroid Function Tests No results for input(s): TSH, T4TOTAL, T3FREE, THYROIDAB in the last 72 hours.  Invalid input(s): FREET3  Other results:   Imaging    No results found.   Medications:     Scheduled Medications: . aspirin  81 mg Oral Daily  . atorvastatin  80 mg Oral q1800  . Chlorhexidine Gluconate Cloth  6 each Topical Daily  . digoxin  0.125 mg Oral Daily  . enoxaparin (LOVENOX) injection  40 mg Subcutaneous Q24H  . furosemide  40 mg Oral Daily  . isosorbide-hydrALAZINE  0.5 tablet Oral TID  . sodium chloride flush  10-40 mL Intracatheter Q12H  . sodium chloride flush  3 mL Intravenous Q12H  . spironolactone  12.5 mg Oral Daily  . ticagrelor  90 mg Oral BID     Infusions: . sodium chloride    . amiodarone 30 mg/hr (08/04/17 1610)  . DOBUTamine 1.031 mcg/kg/min (08/03/17 2000)    PRN Medications: sodium chloride, acetaminophen **OR** acetaminophen, albuterol, ALPRAZolam, ondansetron (ZOFRAN) IV, oxyCODONE-acetaminophen, sodium chloride flush, sodium chloride flush   Assessment/Plan   1. CAD: Inferior STEMI.  Patient has history of MI in 2017, per report had a total of 5 stents in RCA system from the past.  MI this admission was associated with multiple episodes pulseless VT. To cath lab 5/4, found to have occluded RCA, relatively clear left coronary system.The patient had DES to proximal-mid RCA, restoring flow to an acute marginal.  However, the distal RCA/PLV/PDA remained occluded.  There were collaterals to the PLV from the left. - Continue ASA 81, ticagrelor, atorvastatin 80 daily.  - No further revascularization option currently, discussed films with Dr. Excell Seltzer.  - No change to current plan.   2. VF/VT arrest: Patient had pulseless VT initially at presentation and again in the cath lab.  Early 5/6 am he had VF arrest and was again cardioverted.  He was down > 15 minutes with initial arrest prior to ROSC. He has undergone hypothermia protocol and has been rewarmed.  He has been noted to follow commands.  - Stop amiodarone gtt. Start on amiodarone 200 mg BID.  - Stop lidocaine.  - So far, ventricular arrhythmias have been within 48 hrs of PCI. Given incomplete revascularization + VT, would have him wear Lifevest at discharge with repeat echo at 4-6 weeks.  If EF < 35% at that time, will need ICD.  Will send in order for for Lifevest.  - EP has raised concerns about digoxin. We will continue to follow levels regularly.  3. Cardiogenic shock: Ischemic cardiomyopathy, EF 30-35% with inferior WMA on echo this admission.  RV looks ok. He now off norepinephrine, dobutamine reduced to 2. CVP 6-7 after good diuresis.  - Coox 59.7% on dobutamine 1.0  mcg/kg/min. Will stop and follow.  - Continue lasix 40 mg daily.  - Increase Bidil to 1 tab TID.  - Continue spironolactone 12.5 daily.  - Continue digoxin 0.125 mg daily.  4. ID: - Possible aspiration PNA.  He completed 4 days of Zosyn.  5. AKI:  - Creatinine 2.38 -> 2.14 -> 1.64 - Follow. Unclear baseline.  6. Hematuria - Likely from foley.  - Now resolved.   Length of Stay: 5  Graciella Freer, PA-C  08/04/2017, 7:12 AM  Advanced Heart Failure Team Pager [phone] (M-F; 7a - 4p)  Please contact [facility] for night-coverage after hours (4p -7a ) and weekends on [email or web address]  Patient seen with PA, agree with the above note.  He is doing well this morning, co-ox about 60%.  CVP 6.  Awake and alert, walked a little in room yesterday.  Some soreness in chest from CPR.  Creatinine down.   On exam, no JVD.  Mild crackles at bases on lung exam.  Reg S1S2, no murmur.   Today we will stop IV dobutamine and amiodarone.  Transition to amiodarone 200 mg bid, increase Bidil to 1 tab tid.  Continue spironolactone and digoxin.  If BP stable tomorrow, will add Coreg at low dose.  No ARB/Entresto yet but will use eventually now that creatinine coming down.   Mobilized, can go to step down.   Marca Ancona 08/04/2017 7:49 AM

## 2017-08-04 NOTE — Progress Notes (Signed)
Patient continues to have pleuritic pain with coughing. Pain medication given as needed and educated to splint chest with coughing. Day MDs aware of this pain. Will continue to monitor patient closely.

## 2017-08-05 DIAGNOSIS — I5021 Acute systolic (congestive) heart failure: Secondary | ICD-10-CM

## 2017-08-05 LAB — COMPREHENSIVE METABOLIC PANEL
ALT: 155 U/L — ABNORMAL HIGH (ref 17–63)
AST: 110 U/L — ABNORMAL HIGH (ref 15–41)
Albumin: 3.3 g/dL — ABNORMAL LOW (ref 3.5–5.0)
Alkaline Phosphatase: 59 U/L (ref 38–126)
Anion gap: 11 (ref 5–15)
BUN: 21 mg/dL — ABNORMAL HIGH (ref 6–20)
CHLORIDE: 103 mmol/L (ref 101–111)
CO2: 23 mmol/L (ref 22–32)
CREATININE: 1.46 mg/dL — AB (ref 0.61–1.24)
Calcium: 8.9 mg/dL (ref 8.9–10.3)
GFR calc Af Amer: >60 mL/min (ref >60)
GFR, EST NON AFRICAN AMERICAN: 58 mL/min — AB (ref >60)
Glucose, Bld: 111 mg/dL — ABNORMAL HIGH (ref 65–99)
Potassium: 3.9 mmol/L (ref 3.5–5.1)
Sodium: 137 mmol/L (ref 135–145)
Total Bilirubin: 1.5 mg/dL — ABNORMAL HIGH (ref 0.3–1.2)
Total Protein: 6.9 g/dL (ref 6.5–8.1)

## 2017-08-05 LAB — CBC
HEMATOCRIT: 31.7 % — AB (ref 39.0–52.0)
Hemoglobin: 10.7 g/dL — ABNORMAL LOW (ref 13.0–17.0)
MCH: 30.9 pg (ref 26.0–34.0)
MCHC: 33.8 g/dL (ref 30.0–36.0)
MCV: 91.6 fL (ref 78.0–100.0)
Platelets: 247 10*3/uL (ref 150–400)
RBC: 3.46 MIL/uL — AB (ref 4.22–5.81)
RDW: 14.1 % (ref 11.5–15.5)
WBC: 6.5 10*3/uL (ref 4.0–10.5)

## 2017-08-05 LAB — MAGNESIUM: Magnesium: 2.2 mg/dL (ref 1.7–2.4)

## 2017-08-05 LAB — TROPONIN I: Troponin I: 9.82 ng/mL (ref <0.03)

## 2017-08-05 LAB — COOXEMETRY PANEL
CARBOXYHEMOGLOBIN: 1.4 % (ref 0.5–1.5)
METHEMOGLOBIN: 0.8 % (ref 0.0–1.5)
O2 SAT: 57.8 %
Total hemoglobin: 14.7 g/dL (ref 12.0–16.0)

## 2017-08-05 MED ORDER — CARVEDILOL 3.125 MG PO TABS
3.1250 mg | ORAL_TABLET | Freq: Two times a day (BID) | ORAL | Status: DC
  Administered 2017-08-05 – 2017-08-06 (×3): 3.125 mg via ORAL
  Filled 2017-08-05 (×3): qty 1

## 2017-08-05 MED ORDER — SPIRONOLACTONE 25 MG PO TABS
25.0000 mg | ORAL_TABLET | Freq: Every day | ORAL | Status: DC
  Administered 2017-08-05 – 2017-08-06 (×2): 25 mg via ORAL
  Filled 2017-08-05 (×2): qty 1

## 2017-08-05 MED ORDER — POTASSIUM CHLORIDE CRYS ER 20 MEQ PO TBCR
20.0000 meq | EXTENDED_RELEASE_TABLET | Freq: Once | ORAL | Status: AC
  Administered 2017-08-05: 20 meq via ORAL
  Filled 2017-08-05: qty 1

## 2017-08-05 MED ORDER — FUROSEMIDE 20 MG PO TABS
20.0000 mg | ORAL_TABLET | Freq: Every day | ORAL | Status: DC
  Administered 2017-08-05 – 2017-08-06 (×2): 20 mg via ORAL
  Filled 2017-08-05 (×2): qty 1

## 2017-08-05 NOTE — Progress Notes (Signed)
Subjective:  Doing well denies any anginal chest pain or shortness of breath. Urine output markedly improved renal function gradually improving  Objective:  Vital Signs in the last 24 hours: Temp:  [97.9 F (36.6 C)-99.3 F (37.4 C)] 99.3 F (37.4 C) (05/10 0752) Pulse Rate:  [80-106] 97 (05/10 0800) Resp:  [15-27] 19 (05/10 0800) BP: (98-140)/(55-93) 138/76 (05/10 0800) SpO2:  [86 %-100 %] 98 % (05/10 0800)  Intake/Output from previous day: 05/09 0701 - 05/10 0700 In: 588.4 [P.O.:520; I.V.:68.4] Out: 1950 [Urine:1950] Intake/Output from this shift: Total I/O In: 150 [P.O.:150] Out: -   Physical Exam: Neck: no adenopathy, no carotid bruit, no JVD and supple, symmetrical, trachea midline Lungs: clear to auscultation bilaterally Heart: regular rate and rhythm, S1, S2 normal and Soft systolic murmur noted Abdomen: soft, non-tender; bowel sounds normal; no masses,  no organomegaly Extremities: extremities normal, atraumatic, no cyanosis or edema  Lab Results: Recent Labs    08/04/17 0323 08/05/17 0355  WBC 7.9 6.5  HGB 10.3* 10.7*  PLT 209 247   Recent Labs    08/04/17 0323 08/05/17 0355  NA 139 137  K 4.0 3.9  CL 107 103  CO2 24 23  GLUCOSE 122* 111*  BUN 18 21*  CREATININE 1.64* 1.46*   Recent Labs    08/05/17 0355  TROPONINI 9.82*   Hepatic Function Panel Recent Labs    08/05/17 0355  PROT 6.9  ALBUMIN 3.3*  AST 110*  ALT 155*  ALKPHOS 59  BILITOT 1.5*   No results for input(s): CHOL in the last 72 hours. No results for input(s): PROTIME in the last 72 hours.  Imaging: Imaging results have been reviewed and No results found.  Cardiac Studies:  Assessment/Plan:  Status post out of hospital V. Fib cardiac arrest. 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 Cardiogenic shock. Ischemic cardiomyopathy, history of inferior wall MI in the past,  status post PCI to distal RCA, PDA and PLV branch in [place] requiring multiple stents. Hypertension. Elevated blood sugar, rule out diabetes. Status post Acute on chronic kidney injury, multifactorial. Plan Agree with starting Coreg and reducing Lasix. Will discuss with advanced heart failure team regarding stopping BiDil and Entreso from tomorrow in view of increased heart rate with BiDil and also discontinuing digoxin.   LOS: 6 days    Lee Moore 08/05/2017, 9:21 AM

## 2017-08-05 NOTE — Progress Notes (Addendum)
Patient ID: Lee Moore, male   DOB: 15-May-1975, 42 y.o.   MRN: [id number]     Advanced Heart Failure Rounding Note  PCP-Cardiologist: No primary care provider on file.   Subjective:    Extubated 08/02/17  Coox 57.8% off dobutamine. Now on po amio.   Feeling good this am. Excited about going home this weekend. Denies SOB. Denies lightheadedness or dizziness walking around.   Creatinine 2.38 -> 2.14 -> 1.64 -> 1.46. CVP 6-7 . Weight down ~19 lbs from admit.   Objective:   Weight Range: 209 lb 7 oz (95 kg) Body mass index is 28.4 kg/m.   Vital Signs:   Temp:  [97.9 F (36.6 C)-99.3 F (37.4 C)] 98.2 F (36.8 C) (05/10 0452) Pulse Rate:  [80-106] 80 (05/10 0600) Resp:  [15-27] 22 (05/10 0600) BP: (98-140)/(55-96) 114/73 (05/10 0600) SpO2:  [86 %-100 %] 98 % (05/10 0600) Last BM Date: 08/03/17  Weight change: Filed Weights   07/30/17 1934 07/31/17 0410 08/04/17 0500  Weight: 225 lb (102.1 kg) 228 lb 2.5 oz (103.5 kg) 209 lb 7 oz (95 kg)   Intake/Output:   Intake/Output Summary (Last 24 hours) at 08/05/2017 0716 Last data filed at 08/04/2017 2000 Gross per 24 hour  Intake 588.4 ml  Output 1950 ml  Net -1361.6 ml     Physical Exam    CVP 6-7  General: Fatigued appearing. NAD.  HEENT: Normal Neck: Supple. JVP 6-7. Carotids 2+ bilat; no bruits. No thyromegaly or nodule noted. Cor: PMI nondisplaced. RRR, No M/G/R noted Lungs: CTAB, normal effort. Abdomen: Soft, non-tender, non-distended, no HSM. No bruits or masses. +BS  Extremities: No cyanosis, clubbing, or rash. R and LLE no edema.  Neuro: Alert & orientedx3, cranial nerves grossly intact. moves all 4 extremities w/o difficulty. Affect pleasant   Telemetry   NSR, personally reviewed.   Labs    CBC Recent Labs    08/03/17 0316 08/04/17 0323 08/05/17 0355  WBC 7.5 7.9 6.5  NEUTROABS 5.6  --   --   HGB 9.7* 10.3* 10.7*  HCT 28.5* 30.2* 31.7*  MCV 89.9 90.7 91.6  PLT 175 209 247   Basic Metabolic  Panel Recent Labs    08/03/17 0316 08/04/17 0323 08/05/17 0355  NA 139 139 137  K 3.4* 4.0 3.9  CL 105 107 103  CO2 GLUCOSE 97 122* 111*  BUN 20 18 21*  CREATININE 2.14* 1.64* 1.46*  CALCIUM 7.7* 8.7* 8.9  MG 1.8 2.5* 2.2  PHOS 2.2*  --   --    Liver Function Tests Recent Labs    08/04/17 0323 08/05/17 0355  AST 118* 110*  ALT 170* 155*  ALKPHOS 57 59  BILITOT 1.5* 1.5*  PROT 6.8 6.9  ALBUMIN 3.3* 3.3*   No results for input(s): LIPASE, AMYLASE in the last 72 hours. Cardiac Enzymes Recent Labs    08/05/17 0355  TROPONINI 9.82*    BNP: BNP (last 3 results) No results for input(s): BNP in the last 8760 hours.  ProBNP (last 3 results) No results for input(s): PROBNP in the last 8760 hours.   D-Dimer No results for input(s): DDIMER in the last 72 hours. Hemoglobin A1C No results for input(s): HGBA1C in the last 72 hours. Fasting Lipid Panel No results for input(s): CHOL, HDL, LDLCALC, TRIG, CHOLHDL, LDLDIRECT in the last 72 hours. Thyroid Function Tests No results for input(s): TSH, T4TOTAL, T3FREE, THYROIDAB in the last 72 hours.  Invalid input(s): FREET3  Other results:   Imaging    No results found.   Medications:     Scheduled Medications: . amiodarone  200 mg Oral BID  . aspirin  81 mg Oral Daily  . atorvastatin  80 mg Oral q1800  . Chlorhexidine Gluconate Cloth  6 each Topical Daily  . digoxin  0.125 mg Oral Daily  . enoxaparin (LOVENOX) injection  40 mg Subcutaneous Q24H  . furosemide  40 mg Oral Daily  . isosorbide-hydrALAZINE  1 tablet Oral TID  . sodium chloride flush  10-40 mL Intracatheter Q12H  . sodium chloride flush  3 mL Intravenous Q12H  . spironolactone  12.5 mg Oral Daily  . ticagrelor  90 mg Oral BID    Infusions: . sodium chloride      PRN Medications: sodium chloride, acetaminophen **OR** acetaminophen, albuterol, ALPRAZolam, ondansetron (ZOFRAN) IV, oxyCODONE-acetaminophen, sodium chloride flush,  sodium chloride flush   Assessment/Plan   1. CAD: Inferior STEMI.  Patient has history of MI in 2017, per report had a total of 5 stents in RCA system from the past.  MI this admission was associated with multiple episodes pulseless VT. To cath lab 5/4, found to have occluded RCA, relatively clear left coronary system.The patient had DES to proximal-mid RCA, restoring flow to an acute marginal.  However, the distal RCA/PLV/PDA remained occluded.  There were collaterals to the PLV from the left. - Continue ASA 81, ticagrelor, atorvastatin 80 daily.  - Troponin has continued to trend down.  - No further revascularization option currently, discussed films with Dr. Excell Seltzer.  - No change to current plan.   2. VF/VT arrest: Patient had pulseless VT initially at presentation and again in the cath lab.  Early 5/6 am he had VF arrest and was again cardioverted.  He was down > 15 minutes with initial arrest prior to ROSC. He has undergone hypothermia protocol and has been rewarmed.  He has been noted to follow commands.  - Continue po amio 200 mg BID - Lifevest ordered.  - So far, ventricular arrhythmias have been within 48 hrs of PCI.  He will wear a Lifevest at discharge, echo will be done in about 3 months.  If EF < 35% at that time, will need ICD.  Will send in order for for Lifevest.  - EP has raised concerns about digoxin. We will continue to follow levels regularly.  3. Cardiogenic shock: Ischemic cardiomyopathy, EF 30-35% with inferior WMA on echo this admission.  RV looks ok. He now off norepinephrine, dobutamine reduced to 2. CVP 6-7 after good diuresis.  - Coox 57.8% off dobutamine. May be able to pull CVL.  - Decrease Lasix to 20 mg daily.  - Can add low dose Coreg today, 3.125 mg bid.   - Continue Bidil 1 tab TID.  - Increase spironolactone to 25 mg daily.  - Doing well at this point, will stop digoxin.  - Depending on creatinine/BP tomorrow, may start Entresto and stop Lasix completely.  4.  ID: - Possible aspiration PNA.  He completed 4 days of Zosyn.  - No change to current plan.   5. AKI:  - Creatinine continues to trend down. Now to 1.4.  - Follow. Unclear baseline.  6. Hematuria - Likely from foley.  - Resolved.   Length of Stay: [address]  [employer]  08/05/2017, 7:16 AM  Advanced Heart Failure Team Pager [phone] (M-F; 7a - 4p)  Please contact [facility] for night-coverage after hours (4p -7a )  and weekends on [email or web address]  Patient seen with PA, agree with the above note.  He is doing well this morning, co-ox 59%.  CVP 7-8.  Awake and alert, walked with PT yesterday.  Some soreness in chest from CPR.  Creatinine down again.   On exam, no JVD.  Mild crackles at bases on lung exam.  Reg S1S2, no murmur.   Today, we will decrease Lasix to 20 mg daily.  Continue Bidil and increase spironolactone to 25 mg daily.  No ARB/Entresto yet but will use eventually now that creatinine coming down.  Will add low dose Coreg today at 3.125 mg bid.   He is doing well, I think we can stop digoxin.  Depending on what creatinine/BP look like tomorrow, may be reasonable to start Entresto and stop Lasix.   No further ventricular arrhythmias.  He is on amiodarone po.  Lifevest will be arranged.  He will have echo in 3 months to determine need for ICD.   He can go to telemetry today, should be ready to go home tomorrow.   Marca Ancona 08/05/2017 8:10 AM

## 2017-08-05 NOTE — Progress Notes (Signed)
CARDIAC REHAB PHASE I   PRE:  Rate/Rhythm: 92 SR    BP: sitting 117/84    SaO2:   MODE:  Ambulation: 940 ft   POST:  Rate/Rhythm: 96 SR    BP: sitting 129/93     SaO2:   Tolerated very well, slow, steady pace, no LOB, no c/o. Ed completed with wife and mother present. Pt exhibiting short term memory struggles. He remembers some but not all, esp details. Encouraged pt to exercise, have stress relief, practice games and puzzles. Encouraged to do more walking.  [id number]   Harriet Masson CES, ACSM 08/05/2017 1:51 PM

## 2017-08-05 NOTE — Care Management Note (Signed)
Case Management Note Donn Pierini RN, BSN Unit 4E-Case Manager- 2H coverage [phone]  Patient Details  Name: Tkai Serfass MRN: [id number] Date of Birth: [birth year]  Subjective/Objective:    Pt admitted s/p cardiac arrest with Vfib                Action/Plan: PTA pt lived at home- independent- pt on Brilinta- per insurance check copay $50- pt now off vent- spoke with pt at bedside regarding Brilinta- per pt he has been on drug for about a year- has been able to afford copay cost- has not been using copay assist card- provided pt with copay assist card to use on discharge to see if it will help reduce the copay cost for him- pt states that he uses [facility]- PT eval pending- CM to continue to follow for any further transition of care needs.  1445- noted plans for Life Vest- have spoken with Rosanne Ashing at [place]- will need Life Vest order form filled out and submitted for insurance approval prior to discharge- Rosanne Ashing with [employer] to f/u with Dr. Sharyn Lull.   Expected Discharge Date:                  Expected Discharge Plan:  Home/Self Care  In-House Referral:  NA  Discharge planning Services  CM Consult, Medication Assistance  Post Acute Care Choice:  Durable Medical Equipment Choice offered to:  Patient  DME Arranged:  Life vest DME Agency:  [employer]  HH Arranged:  NA HH Agency:  NA  Status of Service:  Completed, signed off  If discussed at Long Length of Stay Meetings, dates discussed:    Discharge Disposition:   Additional Comments:  08/05/17- 1020- Anjulie Dipierro RN, CM- referral received regarding pt's wife request for info on assistance at home- went by room- wife was not there- spoke with pt and provided info about private pay assistance at home- explained insurance does not cover this type of help at home- provided pt with some info from internet search on home assistance services for [employer] that he and his wife could further look into- offered that CM could return when wife  is here if she has further questions. Pt also has been approved for [employer] for discharge- note that plan is for possible d/c on 5/11- per [employer] will plan to fit pt at bedside in AM prior to discharge.    Darrold Span, RN 08/05/2017, 10:20 AM

## 2017-08-05 NOTE — Plan of Care (Signed)
  Problem: Education: Goal: Knowledge of General Education information will improve Outcome: Progressing   Problem: Health Behavior/Discharge Planning: Goal: Ability to manage health-related needs will improve Outcome: Progressing   Problem: Clinical Measurements: Goal: Ability to maintain clinical measurements within normal limits will improve Outcome: Progressing Goal: Will remain free from infection Outcome: Progressing Goal: Diagnostic test results will improve Outcome: Progressing Goal: Respiratory complications will improve Outcome: Progressing Goal: Cardiovascular complication will be avoided Outcome: Progressing   Problem: Activity: Goal: Risk for activity intolerance will decrease Outcome: Progressing   Problem: Nutrition: Goal: Adequate nutrition will be maintained Outcome: Progressing   Problem: Coping: Goal: Level of anxiety will decrease Outcome: Progressing   Problem: Elimination: Goal: Will not experience complications related to bowel motility Outcome: Progressing Goal: Will not experience complications related to urinary retention Outcome: Progressing   Problem: Pain Managment: Goal: General experience of comfort will improve Outcome: Progressing   Problem: Safety: Goal: Ability to remain free from injury will improve Outcome: Progressing   Problem: Skin Integrity: Goal: Risk for impaired skin integrity will decrease Outcome: Progressing   Problem: Education: Goal: Understanding of CV disease, CV risk reduction, and recovery process will improve Outcome: Progressing   Problem: Activity: Goal: Ability to return to baseline activity level will improve Outcome: Progressing   Problem: Cardiovascular: Goal: Ability to achieve and maintain adequate cardiovascular perfusion will improve Outcome: Progressing Goal: Vascular access site(s) Level 0-1 will be maintained Outcome: Progressing   Problem: Health Behavior/Discharge Planning: Goal:  Ability to safely manage health-related needs after discharge will improve Outcome: Progressing   Problem: Education: Goal: Ability to manage disease process will improve Outcome: Progressing   Problem: Cardiac: Goal: Ability to achieve and maintain adequate cardiopulmonary perfusion will improve Outcome: Progressing   Problem: Neurologic: Goal: Promote progressive neurologic recovery Outcome: Progressing   Problem: Skin Integrity: Goal: Risk for impaired skin integrity will be minimized. Outcome: Progressing

## 2017-08-05 NOTE — Progress Notes (Signed)
CRITICAL VALUE ALERT  Critical Value:  Troponin 9.82  Date & Time Notied: 08/05/16 0530  Provider Notified: MD Rachel Bo page; no new orders

## 2017-08-06 DIAGNOSIS — I2119 ST elevation (STEMI) myocardial infarction involving other coronary artery of inferior wall: Secondary | ICD-10-CM

## 2017-08-06 DIAGNOSIS — I255 Ischemic cardiomyopathy: Secondary | ICD-10-CM

## 2017-08-06 DIAGNOSIS — I10 Essential (primary) hypertension: Secondary | ICD-10-CM

## 2017-08-06 LAB — COMPREHENSIVE METABOLIC PANEL
ALBUMIN: 3.4 g/dL — AB (ref 3.5–5.0)
ALK PHOS: 65 U/L (ref 38–126)
ALT: 151 U/L — AB (ref 17–63)
AST: 97 U/L — ABNORMAL HIGH (ref 15–41)
Anion gap: 8 (ref 5–15)
BUN: 26 mg/dL — AB (ref 6–20)
CALCIUM: 9.1 mg/dL (ref 8.9–10.3)
CO2: 24 mmol/L (ref 22–32)
CREATININE: 1.54 mg/dL — AB (ref 0.61–1.24)
Chloride: 106 mmol/L (ref 101–111)
GFR calc Af Amer: >60 mL/min (ref >60)
GFR calc non Af Amer: 54 mL/min — ABNORMAL LOW (ref >60)
GLUCOSE: 113 mg/dL — AB (ref 65–99)
Potassium: 4.2 mmol/L (ref 3.5–5.1)
SODIUM: 138 mmol/L (ref 135–145)
Total Bilirubin: 1.1 mg/dL (ref 0.3–1.2)
Total Protein: 7.3 g/dL (ref 6.5–8.1)

## 2017-08-06 LAB — CBC
HCT: 33.2 % — ABNORMAL LOW (ref 39.0–52.0)
Hemoglobin: 10.9 g/dL — ABNORMAL LOW (ref 13.0–17.0)
MCH: 30.4 pg (ref 26.0–34.0)
MCHC: 32.8 g/dL (ref 30.0–36.0)
MCV: 92.7 fL (ref 78.0–100.0)
PLATELETS: 323 10*3/uL (ref 150–400)
RBC: 3.58 MIL/uL — ABNORMAL LOW (ref 4.22–5.81)
RDW: 13.9 % (ref 11.5–15.5)
WBC: 7 10*3/uL (ref 4.0–10.5)

## 2017-08-06 MED ORDER — ISOSORB DINITRATE-HYDRALAZINE 20-37.5 MG PO TABS: 1.0000 | ORAL_TABLET | Freq: Three times a day (TID) | ORAL | 0 refills | Status: DC

## 2017-08-06 MED ORDER — ISOSORB DINITRATE-HYDRALAZINE 20-37.5 MG PO TABS: 1.0000 | ORAL_TABLET | Freq: Three times a day (TID) | ORAL | 6 refills | Status: DC

## 2017-08-06 MED ORDER — AMIODARONE HCL 200 MG PO TABS: 200.0000 mg | ORAL_TABLET | Freq: Two times a day (BID) | ORAL | 6 refills | Status: DC

## 2017-08-06 MED ORDER — ATORVASTATIN CALCIUM 80 MG PO TABS: 80.0000 mg | ORAL_TABLET | Freq: Every day | ORAL | 6 refills | Status: DC

## 2017-08-06 MED ORDER — SPIRONOLACTONE 25 MG PO TABS: 25.0000 mg | ORAL_TABLET | Freq: Every day | ORAL | 6 refills | Status: DC

## 2017-08-06 MED ORDER — FUROSEMIDE 20 MG PO TABS: 20.0000 mg | ORAL_TABLET | Freq: Every day | ORAL | 6 refills | Status: DC | PRN

## 2017-08-06 MED ORDER — AMOXICILLIN-POT CLAVULANATE 875-125 MG PO TABS
1.0000 | ORAL_TABLET | Freq: Two times a day (BID) | ORAL | Status: DC
  Administered 2017-08-06: 1 via ORAL
  Filled 2017-08-06: qty 1

## 2017-08-06 MED ORDER — CARVEDILOL 6.25 MG PO TABS: 6.2500 mg | ORAL_TABLET | Freq: Two times a day (BID) | ORAL | Status: DC

## 2017-08-06 MED ORDER — SACUBITRIL-VALSARTAN 24-26 MG PO TABS: 1.0000 | ORAL_TABLET | Freq: Two times a day (BID) | ORAL | 6 refills | Status: DC

## 2017-08-06 MED ORDER — SACUBITRIL-VALSARTAN 24-26 MG PO TABS: 1.0000 | ORAL_TABLET | Freq: Two times a day (BID) | ORAL | 0 refills | Status: DC

## 2017-08-06 MED ORDER — BRILINTA 90 MG PO TABS: 90.0000 mg | ORAL_TABLET | Freq: Two times a day (BID) | ORAL | 6 refills | Status: DC

## 2017-08-06 MED ORDER — ISOSORB DINITRATE-HYDRALAZINE 20-37.5 MG PO TABS
1.0000 | ORAL_TABLET | Freq: Three times a day (TID) | ORAL | Status: DC
  Administered 2017-08-06: 1 via ORAL
  Filled 2017-08-06: qty 1

## 2017-08-06 MED ORDER — SACUBITRIL-VALSARTAN 24-26 MG PO TABS
1.0000 | ORAL_TABLET | Freq: Two times a day (BID) | ORAL | Status: DC
  Filled 2017-08-06 (×2): qty 1

## 2017-08-06 NOTE — Progress Notes (Signed)
Patient ID: Lee Moore, male   DOB: 01/18/1976, 42 y.o.   MRN: [id number]     Advanced Heart Failure Rounding Note  PCP-Cardiologist: No primary care provider on file.   Subjective:    Feels good. Wants to go home. LifeVest being fit now.   Mild cough with scant whitish sputum. No fevers or chills   Rhythm stable  Creatinine 1.5   Objective:   Weight Range: 95 kg (209 lb 7 oz) Body mass index is 28.4 kg/m.   Vital Signs:   Temp:  [97.5 F (36.4 C)-98.7 F (37.1 C)] 97.5 F (36.4 C) (05/11 0819) Pulse Rate:  [78-97] 82 (05/11 0819) Resp:  [17-25] 22 (05/11 0819) BP: (101-123)/(63-76) 122/71 (05/11 0819) Last BM Date: 08/05/17  Weight change: Filed Weights   07/30/17 1934 07/31/17 0410 08/04/17 0500  Weight: 102.1 kg (225 lb) 103.5 kg (228 lb 2.5 oz) 95 kg (209 lb 7 oz)   Intake/Output:   Intake/Output Summary (Last 24 hours) at 08/06/2017 1254 Last data filed at 08/06/2017 1047 Gross per 24 hour  Intake 370 ml  Output -  Net 370 ml     Physical Exam   General:  Well appearing. Sitting in chair  No resp difficulty HEENT: normal Neck: supple. JVP 5-6. Carotids 2+ bilat; no bruits. No lymphadenopathy or thryomegaly appreciated. Cor: PMI nondisplaced. Regular rate & rhythm. No rubs, gallops or murmurs. Lifevest on  Lungs: clear Abdomen: soft, nontender, nondistended. No hepatosplenomegaly. No bruits or masses. Good bowel sounds. Extremities: no cyanosis, clubbing, rash, edema Neuro: alert & orientedx3, cranial nerves grossly intact. moves all 4 extremities w/o difficulty. Affect pleasant   Telemetry   NSR 80s, personally reviewed.   Labs    CBC Recent Labs    08/05/17 0355 08/06/17 0429  WBC 6.5 7.0  HGB 10.7* 10.9*  HCT 31.7* 33.2*  MCV 91.6 92.7  PLT 247 323   Basic Metabolic Panel Recent Labs    40/98/11 0323 08/05/17 0355 08/06/17 0429  NA 139 137 138  K 4.0 3.9 4.2  CL 107 103 106  CO2 GLUCOSE 122* 111* 113*  BUN 18 21*  26*  CREATININE 1.64* 1.46* 1.54*  CALCIUM 8.7* 8.9 9.1  MG 2.5* 2.2  --    Liver Function Tests Recent Labs    08/05/17 0355 08/06/17 0429  AST 110* 97*  ALT 155* 151*  ALKPHOS 59 65  BILITOT 1.5* 1.1  PROT 6.9 7.3  ALBUMIN 3.3* 3.4*   No results for input(s): LIPASE, AMYLASE in the last 72 hours. Cardiac Enzymes Recent Labs    08/05/17 0355  TROPONINI 9.82*    BNP: BNP (last 3 results) No results for input(s): BNP in the last 8760 hours.  ProBNP (last 3 results) No results for input(s): PROBNP in the last 8760 hours.   D-Dimer No results for input(s): DDIMER in the last 72 hours. Hemoglobin A1C No results for input(s): HGBA1C in the last 72 hours. Fasting Lipid Panel No results for input(s): CHOL, HDL, LDLCALC, TRIG, CHOLHDL, LDLDIRECT in the last 72 hours. Thyroid Function Tests No results for input(s): TSH, T4TOTAL, T3FREE, THYROIDAB in the last 72 hours.  Invalid input(s): FREET3  Other results:   Imaging    No results found.   Medications:     Scheduled Medications: . amiodarone  200 mg Oral BID  . amoxicillin-clavulanate  1 tablet Oral Q12H  . aspirin  81 mg Oral Daily  . atorvastatin  80 mg Oral  q1800  . carvedilol  6.25 mg Oral BID WC  . Chlorhexidine Gluconate Cloth  6 each Topical Daily  . enoxaparin (LOVENOX) injection  40 mg Subcutaneous Q24H  . furosemide  20 mg Oral Daily  . sacubitril-valsartan  1 tablet Oral BID  . sodium chloride flush  10-40 mL Intracatheter Q12H  . sodium chloride flush  3 mL Intravenous Q12H  . spironolactone  25 mg Oral Daily  . ticagrelor  90 mg Oral BID    Infusions: . sodium chloride      PRN Medications: sodium chloride, acetaminophen **OR** acetaminophen, albuterol, ALPRAZolam, ondansetron (ZOFRAN) IV, oxyCODONE-acetaminophen, sodium chloride flush, sodium chloride flush   Assessment/Plan   1. CAD: Inferior STEMI.  Patient has history of MI in 2017, per report had a total of 5 stents in RCA  system from the past.  MI this admission was associated with multiple episodes pulseless VT. To cath lab 5/4, found to have occluded RCA, relatively clear left coronary system.The patient had DES to proximal-mid RCA, restoring flow to an acute marginal.  However, the distal RCA/PLV/PDA remained occluded.  There were collaterals to the PLV from the left. - Continue ASA 81, ticagrelor, atorvastatin 80 daily.  - Troponin has continued to trend down.  - No further revascularization option currently, discussed films with Dr. Excell Seltzer.  - No change to current plan.   - Stable no s/s angina today 2. VF/VT arrest: Patient had pulseless VT initially at presentation and again in the cath lab.  Early 5/6 am he had VF arrest and was again cardioverted.  He was down > 15 minutes with initial arrest prior to ROSC. He has undergone hypothermia protocol and has been rewarmed.  He has been noted to follow commands.  - Continue po amio 200 mg BID - Lifevest placed. Tele without VT - So far, ventricular arrhythmias have been within 48 hrs of PCI.  He will wear a Lifevest at discharge, echo will be done in about 3 months.  If EF < 35% at that time, will need ICD.  Will send in order for for Lifevest.  - EP has raised concerns about digoxin. We will continue to follow levels regularly.  3. Cardiogenic shock: Ischemic cardiomyopathy, EF 30-35% with inferior WMA on echo this admission.  RV looks ok. He now off norepinephrine, dobutamine reduced to 2. CVP 6-7 after good diuresis.  - Volume status stable  - Will stop lasix with entresto and spiro - Tolerating carvedilol 3.125 bid - Continue Bidil 1 tab TID.  - Continue spiro 35 daily  - Doing well at this point, will stop digoxin.  - On Entresto 24/26 - will need coupon card prior to d/c 4. ID: - Possible aspiration PNA.  He completed 4 days of Zosyn.  - No change to current plan.   - No evidence of ongoing infection. WBC 7.0 Stop abx.  5. AKI:  - Creatinine continues  to trend down. Now to 1.5 - Follow. Unclear baseline.  6. Hematuria - Likely from foley.  - Resolved.   Ok for d/c today:  Cardiac meds ECASA 81 Amio 200 bid Carvedilol 3.125 bid Entresto 24/26 bid (needs coupon card) Bidil 1 tab tid (needs coupon card) Atorva 80 daily Spiro 25 daily Brillinta 90 bid Lasix 20 daily prn only for swelling/weight gain   F/u HF Clinic   Length of Stay: 7  Arvilla Meres, MD  08/06/2017, 12:54 PM  Advanced Heart Failure Team Pager [phone] (M-F; 7a - 4p)  Please contact [facility] for night-coverage after hours (4p -7a ) and weekends on [email or web address]

## 2017-08-06 NOTE — Discharge Summary (Addendum)
Discharge Summary    Patient ID: Lee Moore,  MRN: [id number], DOB/AGE: Nov 03, 1975 42 y.o.  Admit date: 07/30/2017 Discharge date: 08/06/2017  Primary Care Provider: Patient, No Pcp Per Primary Cardiologist: Dr. Sharyn Lull Dr. Shirlee Latch  Discharge Diagnoses    Principal Problem:   Cardiac arrest with ventricular fibrillation [facility]) Active Problems:   Cardiogenic shock (HCC)   ST elevation myocardial infarction (STEMI) of inferior wall (HCC)   Essential hypertension   Ischemic cardiomyopathy   History of Present Illness     Lee Moore is a 42 y.o. male with past medical history of CAD (status post prior infarction in 2017 with 5 stents to distal RCA and PDA per patient's report), HTN, and HLD who presented to [facility] ED on 07/30/2017 for a witnessed cardiac arrest.  He had been at a friend's house and suddenly collapsed per report from bystanders, therefore EMS was called and he was noted to be in ventricular fibrillation upon their arrival and required multiple defibrillations with conversion to normal sinus rhythm. His repeat EKG showed ST elevation along the inferior leads, therefore code STEMI was activated and he was taken to the Cath Lab for an emergent catheterization.  Hospital Course     Consultants: CHF, EP   This was performed by Dr. Sharyn Lull on 07/30/2017 and showed 100% occlusion of the proximal to mid RCA with 100% stenosis of the distal RCA and post atrio vessel. PCI was performed with thrombectomy and DES placement to the proximal to mid RCA. Distal RCA and PDA were chronically occluded with collaterals to the PVL noted. An echocardiogram was performed and showed a reduced EF of 30 to 35%. He remained intubated and sedated following the procedure and was rewarmed to 33 C on 08/01/2017.  He continued to have episodes of VF arrest and required ACLS, shock, and 6 minutes of CPR therefore CHF and EP were consulted for further management. He had already been on an Amiodarone  drip and lidocaine was added. He also required pressor support and these were weaned as blood pressure tolerated.  He was extubated on 08/02/2017 and was started on IV Lasix for diuresis. As SBP allowed, he was started on beta-blocker therapy along with BiDil given his variable kidney function. IV Amiodarone was transitioned to amiodarone 200 mg twice daily. Creatinine continued to improve throughout admission (at 1.54 upon the time of discharge) and he was a started on Entresto 24-26mg  BID prior to discharge.   As per EP recommendations, a LifeVest was arranged prior to discharge. He will need a repeat echocardiogram in 3 months to reassess EF. He was last examined by Dr. Gala Romney on 08/06/2017 and deemed stable for discharge.  He was provided with scripts for his new medications including ASA, Brilinta, Atorvastatin, Amiodarone, Coreg, Entresto, Bidil, Spironolactone, and PRN Lasix. Cardiology follow-up has been arranged in the CHF clinic.  He was discharged home in stable condition.  _____________  Discharge Vitals Blood pressure 122/71, pulse 82, temperature (!) 97.5 F (36.4 C), temperature source Oral, resp. rate (!) 22, height 6' (1.829 m), weight 209 lb 7 oz (95 kg), SpO2 98 %.  Filed Weights   07/30/17 1934 07/31/17 0410 08/04/17 0500  Weight: 225 lb (102.1 kg) 228 lb 2.5 oz (103.5 kg) 209 lb 7 oz (95 kg)    Labs & Radiologic Studies     CBC Recent Labs    08/05/17 0355 08/06/17 0429  WBC 6.5 7.0  HGB 10.7* 10.9*  HCT 31.7* 33.2*  MCV 91.6 92.7  PLT 247 323   Basic Metabolic Panel Recent Labs    16/10/96 0323 08/05/17 0355 08/06/17 0429  NA 139 137 138  K 4.0 3.9 4.2  CL 107 103 106  CO2 GLUCOSE 122* 111* 113*  BUN 18 21* 26*  CREATININE 1.64* 1.46* 1.54*  CALCIUM 8.7* 8.9 9.1  MG 2.5* 2.2  --    Liver Function Tests Recent Labs    08/05/17 0355 08/06/17 0429  AST 110* 97*  ALT 155* 151*  ALKPHOS 59 65  BILITOT 1.5* 1.1  PROT 6.9 7.3  ALBUMIN  3.3* 3.4*   No results for input(s): LIPASE, AMYLASE in the last 72 hours. Cardiac Enzymes Recent Labs    08/05/17 0355  TROPONINI 9.82*   BNP Invalid input(s): POCBNP D-Dimer No results for input(s): DDIMER in the last 72 hours. Hemoglobin A1C No results for input(s): HGBA1C in the last 72 hours. Fasting Lipid Panel No results for input(s): CHOL, HDL, LDLCALC, TRIG, CHOLHDL, LDLDIRECT in the last 72 hours. Thyroid Function Tests No results for input(s): TSH, T4TOTAL, T3FREE, THYROIDAB in the last 72 hours.  Invalid input(s): FREET3  Ct Head Wo Contrast  Result Date: 07/31/2017 CLINICAL DATA:  42 year old male with history of cardiac arrest. EXAM: CT HEAD WITHOUT CONTRAST TECHNIQUE: Contiguous axial images were obtained from the base of the skull through the vertex without intravenous contrast. COMPARISON:  None. FINDINGS: Brain: No evidence of acute infarction, hemorrhage, hydrocephalus, extra-axial collection or mass lesion/mass effect. Vascular: No hyperdense vessel or unexpected calcification. Skull: Normal. Negative for fracture or focal lesion. Sinuses/Orbits: Mild multifocal mucosal thickening throughout the paranasal sinuses. No acute finding. Other: Support tubes noted in the oral cavity, incompletely imaged. IMPRESSION: 1. No acute intracranial abnormalities. Electronically Signed   By: Trudie Reed M.D.   On: 07/31/2017 14:52   Dg Chest Port 1 View  Result Date: 08/02/2017 CLINICAL DATA:  Evaluate endotracheal tube, respiratory failure EXAM: PORTABLE CHEST 1 VIEW COMPARISON:  Portable chest x-ray of 08/01/2017 FINDINGS: The tip of the endotracheal tube is approximately 3.3 cm above the carina. Right IJ central venous line tip overlies the expected SVC-RA junction. Minimal haziness again is noted throughout the right hemithorax with no abnormality noted in the left lung. This could represent pneumonia possibly due to aspiration and follow-up is recommended to ensure clearing.  Heart is mildly enlarged and stable. No bony abnormality is seen. IMPRESSION: 1. Tip of endotracheal tube 3.3 cm above carina. 2. Some haziness remains throughout the right lung which could be due to pneumonia possibly aspiration and follow-up is recommended Electronically Signed   By: Dwyane Dee M.D.   On: 08/02/2017 09:52   Dg Chest Port 1 View  Result Date: 08/01/2017 CLINICAL DATA:  ETT, respiratory failure EXAM: PORTABLE CHEST 1 VIEW COMPARISON:  07/31/2017 at 1208 hours FINDINGS: Endotracheal tube terminates 2.5 cm above the carina. Right IJ venous catheter terminates in the upper right atrium, 2.5 cm below the cavoatrial junction. Enteric tube terminates in the gastric cardia. Defibrillator pads overlying the left hemithorax. Mild patchy medial right lower lobe opacity, likely atelectasis. No pleural effusion or pneumothorax. IMPRESSION: Endotracheal tube terminates 2.5 cm above the carina. Additional support apparatus as above. Electronically Signed   By: Charline Bills M.D.   On: 08/01/2017 07:22   Dg Chest Port 1 View  Result Date: 08/01/2017 CLINICAL DATA:  Cardiac arrest, ETT EXAM: PORTABLE CHEST 1 VIEW COMPARISON:  08/01/2017 at 0520 hours FINDINGS: Endotracheal  tube terminates 2.5 cm above the carina. Mild patchy/hazy right perihilar and upper lobe opacity, possibly reflecting aspiration. No pleural effusion or pneumothorax. Heart is normal in size. Right IJ venous catheter terminates the cavoatrial junction. Enteric tube terminates in the gastric cardia. Defibrillator pads overlying the left hemithorax. IMPRESSION: Endotracheal tube terminates 2.5 cm above the carina. Mild patchy right lung opacities, possibly reflecting aspiration. Additional support apparatus as above. Electronically Signed   By: Charline Bills M.D.   On: 08/01/2017 07:21   Dg Chest Port 1 View  Result Date: 07/31/2017 CLINICAL DATA:  43 y/o  M; endotracheal tube, central line, OG tube. EXAM: PORTABLE CHEST 1 VIEW  COMPARISON:  None. FINDINGS: Endotracheal tube tip 2 cm above the carina. Enteric tube tip below field of view and abdomen. Right central venous catheter tip projects over right cavoatrial junction. Transcutaneous pacing pads noted. Hazy opacities in the lungs. No pleural effusion or pneumothorax. Bones are unremarkable. IMPRESSION: Hazy opacities of the lungs probably represents vascular congestion. Endotracheal tube, enteric tube, and right central venous catheter in satisfactory position. Electronically Signed   By: Mitzi Hansen M.D.   On: 07/31/2017 00:33     Diagnostic Studies/Procedures     Cardiac Catheterization: 07/30/2017  Ost LM lesion is 20% stenosed.  Ost LAD to Prox LAD lesion is 20% stenosed.  Prox RCA to Mid RCA lesion is 100% stenosed.  Dist RCA lesion is 100% stenosed.  Post Atrio lesion is 100% stenosed.  Ost RPDA to RPDA lesion is 100% stenosed.  Post intervention, there is a 0% residual stenosis.  A drug-eluting stent was successfully placed using a STENT SIERRA 3.50 X 38 MM.  Echocardiogram: 07/31/2017 Study Conclusions  - Left ventricle: The cavity size was normal. Systolic function was   moderately to severely reduced. The estimated ejection fraction   was in the range of 30% to 35%. Severe hypokinesis of the   basal-midinferior myocardium. - Atrial septum: No defect or patent foramen ovale was identified.    Disposition   Pt is being discharged home today in good condition.  Follow-up Plans & Appointments    Follow-up Information    Marinus Maw, MD Follow up on 08/30/2017.   Specialty:  Cardiology Why:  11:15AM Contact information: [address] [place] [address] [phone]        [facility] Follow up on 08/16/2017.   Specialty:  Cardiology Why:  at 1030 am for post hospital follow up. The code for parking is 1200. Psychologist, sport and exercise thru [employer] off of [address],  underground parking on your right. Can also park in lower ED lot and enter blue awning.  Contact information: [address] [id number] [place] [address] [phone]         Discharge Instructions    Amb Referral to Cardiac Rehabilitation   Complete by:  As directed    Diagnosis:   Coronary Stents PTCA STEMI     Diet - low sodium heart healthy   Complete by:  As directed    Discharge instructions   Complete by:  As directed    PLEASE REMEMBER TO BRING ALL OF YOUR MEDICATIONS TO EACH OF YOUR FOLLOW-UP OFFICE VISITS.  PLEASE ATTEND ALL SCHEDULED FOLLOW-UP APPOINTMENTS.   Activity: Increase activity slowly as tolerated. You may shower, but no soaking baths (or swimming) for 1 week. You may not return to work until cleared by your cardiologist. No lifting over 10 lbs for  4 weeks. No sexual activity for 4 weeks.   Wound Care: You may wash cath site gently with soap and water. Keep cath site clean and dry. If you notice pain, swelling, bleeding or pus at your cath site, please call [phone].   Increase activity slowly   Complete by:  As directed       Discharge Medications     Medication List    STOP taking these medications   lisinopril 2.5 MG tablet Commonly known as:  PRINIVIL,ZESTRIL     TAKE these medications   amiodarone 200 MG tablet Commonly known as:  PACERONE Take 1 tablet (200 mg total) by mouth 2 (two) times daily.   aspirin EC 81 MG tablet Take 81 mg by mouth daily.   atorvastatin 80 MG tablet Commonly known as:  LIPITOR Take 1 tablet (80 mg total) by mouth daily at 6 PM.   BRILINTA 90 MG Tabs tablet Generic drug:  ticagrelor Take 1 tablet (90 mg total) by mouth 2 (two) times daily.   carvedilol 3.125 MG tablet Commonly known as:  COREG Take 3.125 mg by mouth 2 (two) times daily.   furosemide 20 MG tablet Commonly known as:  LASIX Take 1 tablet (20 mg total) by mouth daily as needed for edema.     isosorbide-hydrALAZINE 20-37.5 MG tablet Commonly known as:  BIDIL Take 1 tablet by mouth 3 (three) times daily.   sacubitril-valsartan 24-26 MG Commonly known as:  ENTRESTO Take 1 tablet by mouth 2 (two) times daily.   spironolactone 25 MG tablet Commonly known as:  ALDACTONE Take 1 tablet (25 mg total) by mouth daily. Start taking on:  08/07/2017       Aspirin prescribed at discharge?  Yes High Intensity Statin Prescribed? (Lipitor 40-80mg  or Crestor 20-40mg ): Yes Beta Blocker Prescribed? Yes For EF 40% or less, Was ACEI/ARB Prescribed? Yes ADP Receptor Inhibitor Prescribed? (i.e. Plavix etc.-Includes Medically Managed Patients): Yes For EF <40%, Aldosterone Inhibitor Prescribed? Yes Was EF assessed during THIS hospitalization? Yes Was Cardiac Rehab II ordered? (Included Medically managed Patients): Yes   Allergies No Known Allergies   Outstanding Labs/Studies   BMET at Follow-Up; Echo in 3 months to reassess EF  Duration of Discharge Encounter   Greater than 30 minutes including physician time.  Signed, Ellsworth Lennox, PA-C 08/06/2017, 5:28 PM  Agree with above. He is ready for d/c. Will follow closely in HF clinic.   Arvilla Meres, MD  5:43 PM

## 2017-08-06 NOTE — Progress Notes (Signed)
Coupon cards given for [employer] and Brilinta as requestedAlexis Moore [phone]

## 2017-08-06 NOTE — Progress Notes (Signed)
Subjective:   patient complains of cough with pleuritic chest pain and yellowish mucus. Denies any fever or chills.heart rate remains in 90s to low 100. LifeVest is being  arranged  Objective:  Vital Signs in the last 24 hours: Temp:  [97.5 F (36.4 C)-98.7 F (37.1 C)] 97.5 F (36.4 C) (05/11 0819) Pulse Rate:  [78-97] 82 (05/11 0819) Resp:  [17-25] 22 (05/11 0819) BP: (101-123)/(63-82) 122/71 (05/11 0819) SpO2:  [98 %] 98 % (05/10 1156)  Intake/Output from previous day: 05/10 0701 - 05/11 0700 In: 630 [P.O.:630] Out: 800 [Urine:800] Intake/Output from this shift: Total I/O In: 10 [I.V.:10] Out: -   Physical Exam: Neck: no adenopathy, no carotid bruit, no JVD and supple, symmetrical, trachea midline Lungs: Decreased breath sound at bases Heart: regular rate and rhythm, S1, S2 normal and Soft systolic murmur noted Abdomen: soft, non-tender; bowel sounds normal; no masses,  no organomegaly Extremities: extremities normal, atraumatic, no cyanosis or edema  Lab Results: Recent Labs    08/05/17 0355 08/06/17 0429  WBC 6.5 7.0  HGB 10.7* 10.9*  PLT 247 323   Recent Labs    08/05/17 0355 08/06/17 0429  NA 137 138  K 3.9 4.2  CL 103 106  CO2 23 24  GLUCOSE 111* 113*  BUN 21* 26*  CREATININE 1.46* 1.54*   Recent Labs    08/05/17 0355  TROPONINI 9.82*   Hepatic Function Panel Recent Labs    08/06/17 0429  PROT 7.3  ALBUMIN 3.4*  AST 97*  ALT 151*  ALKPHOS 65  BILITOT 1.1   No results for input(s): CHOL in the last 72 hours. No results for input(s): PROTIME in the last 72 hours.  Imaging: Imaging results have been reviewed and No results found.  Cardiac Studies:  Assessment/Plan:  Status post out of hospital V. Fib cardiac arrest. 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 Cardiogenic shock. Ischemic cardiomyopathy, history of inferior wall MI in  the past, status post PCI to distal RCA, PDA and PLV branch in [place] requiring multiple stents. Hypertension. Elevated blood sugar, rule out diabetes. Status post Acute on chronic kidney injury, multifactorial. bronchitis Plan DC BiDil Start Entresto as per orders Increase Coreg as per orders Start Augmentin as per orders Check labs in a.m.   LOS: 7 days    Rinaldo Cloud 08/06/2017, 11:24 AM

## 2017-08-07 ENCOUNTER — Telehealth: Payer: Self-pay | Admitting: Student

## 2017-08-07 NOTE — Telephone Encounter (Signed)
    Patient's wife called reporting that he continues to have clear mucus production which was occurring yesterday while still admitted. By review of notes, he was treated for presumed aspiration PNA during admission with Zosyn and at the time of discharge yesterday, it was recommended to stop antibiotics as he was afebrile and WBC count had normalized to 7.0. Reviewed this with the patient's wife. Recommended checking his temperature to make sure this remains stable. Reviewed that he can use plain Mucinex for congestion but to avoid Mucinex D or dextromethorphan-containing products.   She voiced understanding of this and was appreciative of the call.    Signed, Ellsworth Lennox, PA-C 08/07/2017, 2:15 PM Pager: [phone]

## 2017-08-08 ENCOUNTER — Telehealth (HOSPITAL_COMMUNITY): Payer: Self-pay | Admitting: *Deleted

## 2017-08-08 NOTE — Telephone Encounter (Signed)
Entresto PA approved from 08/08/2017 through 03/28/2038.

## 2017-08-09 ENCOUNTER — Telehealth (HOSPITAL_COMMUNITY): Payer: Self-pay

## 2017-08-09 NOTE — Telephone Encounter (Signed)
Patients insurance is active and benefits verified through [place] - No co-pay, deductible amount of $1,150/$1,1,150 has been met, out of pocket amount of $500.00/$191.15 has been met, 10% co-insurance, and no pre-authorization required. Reference #[id number]  Will contact patient to see if he is interested in the Cardiac Rehab Program. If interested, patient will need to complete follow up appt. Once completed, patient will be contacted for scheduling upon review by the RN Navigator.

## 2017-08-09 NOTE — Telephone Encounter (Signed)
Called and spoke with wife of patient to see if he is interested in the Cardiac  Rehab Program. Wife stated he is very interested. Explained scheduling process and went over insurance with wife, verbalized understanding. Will contact patient for scheduling once follow up appt has been completed.

## 2017-08-10 ENCOUNTER — Ambulatory Visit
Admission: RE | Admit: 2017-08-10 | Discharge: 2017-08-10 | Disposition: A | Discharge status: Home / Self Care | Payer: BLUE CROSS/BLUE SHIELD | Source: Ambulatory Visit | Attending: Cardiology | Admitting: Cardiology

## 2017-08-10 ENCOUNTER — Other Ambulatory Visit: Payer: Self-pay | Admitting: Cardiology

## 2017-08-10 DIAGNOSIS — R0781 Pleurodynia: Secondary | ICD-10-CM

## 2017-08-16 ENCOUNTER — Ambulatory Visit (HOSPITAL_COMMUNITY)
Admission: RE | Admit: 2017-08-16 | Discharge: 2017-08-16 | Disposition: A | Discharge status: Home / Self Care | Payer: BLUE CROSS/BLUE SHIELD | Source: Ambulatory Visit | Attending: Cardiology | Admitting: Cardiology

## 2017-08-16 ENCOUNTER — Encounter (HOSPITAL_COMMUNITY): Payer: Self-pay | Admitting: Cardiology

## 2017-08-16 VITALS — BP 121/64 | HR 69 | Wt 205.5 lb

## 2017-08-16 DIAGNOSIS — Z79899 Other long term (current) drug therapy: Secondary | ICD-10-CM | POA: Diagnosis not present

## 2017-08-16 DIAGNOSIS — I255 Ischemic cardiomyopathy: Secondary | ICD-10-CM | POA: Insufficient documentation

## 2017-08-16 DIAGNOSIS — I472 Ventricular tachycardia, unspecified: Secondary | ICD-10-CM

## 2017-08-16 DIAGNOSIS — I5022 Chronic systolic (congestive) heart failure: Secondary | ICD-10-CM | POA: Diagnosis not present

## 2017-08-16 DIAGNOSIS — I2510 Atherosclerotic heart disease of native coronary artery without angina pectoris: Secondary | ICD-10-CM | POA: Insufficient documentation

## 2017-08-16 DIAGNOSIS — Z7982 Long term (current) use of aspirin: Secondary | ICD-10-CM | POA: Diagnosis not present

## 2017-08-16 DIAGNOSIS — N182 Chronic kidney disease, stage 2 (mild): Secondary | ICD-10-CM | POA: Diagnosis not present

## 2017-08-16 DIAGNOSIS — I252 Old myocardial infarction: Secondary | ICD-10-CM | POA: Diagnosis not present

## 2017-08-16 MED ORDER — CARVEDILOL 6.25 MG PO TABS: 6.2500 mg | ORAL_TABLET | Freq: Two times a day (BID) | ORAL | 3 refills | Status: DC

## 2017-08-16 MED ORDER — AMIODARONE HCL 200 MG PO TABS: 200.0000 mg | ORAL_TABLET | Freq: Every day | ORAL | 3 refills | Status: DC

## 2017-08-16 MED ORDER — SPIRONOLACTONE 25 MG PO TABS: 12.5000 mg | ORAL_TABLET | Freq: Every day | ORAL | 3 refills | Status: DC

## 2017-08-16 NOTE — Progress Notes (Signed)
HF Cardiology: Dr. Shirlee Latch  42 yo with history of CAD s/p MI in 2017 and again in 5/19 presents for followup of CHF and CAD.  Patient had initial MI in [place] in 2017, he reports a total of 5 stents to the RCA system. He recently moved to [place] with work.  In 5/19, he had inferior STEMI complicated by multiple episodes of pulseless VT.  He had CPR and ultimately when to the cath lab.  This showed occluded proximal RCA but minimal disease on the left.  The proximal to mid RCA were stented, restoring flow to an acute marginal.  However, the distal RCA, PLV and PDA remained occluded. There were collaterals to the PLV from the left suggesting pre-existing disease.  He underwent hypothermia protocol and had full neurologic recovery from arrest.  Echo in 5/19 showed EF 30-35%.  Cardiac medications were titrated up and he was discharged with a Lifevest.    Since discharge, he has been stable. Some ongoing pleuritic soreness in his chest, likely related to CPR.  He has some fatigue and does get lightheaded on occasion if he stands too fast.  No dyspnea walking on flat ground or up stairs.  No orthopnea/PND.  No BRBPR/melena. He has had a depressed mood and some anxiety since his MI. Not sleeping well. He has some tingling in his fingertips and toes.   ECG (5/19): NSR, old inferior MI (personally reviewed).   Labs (5/19): K 4.2 => 4.8, creatinine 1.54 => 1.38, hgb 11.7, AST 97, ALT 151  PMH: 1. CAD: MI in 2017, per report patient had a total of 5 stents in the RCA system.  - Inferior STEMI 5/19: LHC showed occluded RCA with relatively clear left coronary system. DES to proximal-mid RCA restoring flow to an acute marginal, however, the distal RCA/PLV/PDA remained occluded.  There were collaterals to the PLV from the left.   2. VT: Patient had pulseless VT in setting of inferior STEMI.   3. Chronic systolic CHF: Ischemic cardiomyopathy.   - Echo (5/19): EF 30-35%, inferior WMA.   4. CKD: Stage II.   FH:  No premature CAD.   SH: Married with a child.  Works for [employer].  Moved to [place] from [place].  No smoking, rare ETOH.   ROS: All systems reviewed and negative except as per HPI.   Current Outpatient Medications  Medication Sig Dispense Refill  . amiodarone (PACERONE) 200 MG tablet Take 1 tablet (200 mg total) by mouth daily. 30 tablet 3  . aspirin EC 81 MG tablet Take 81 mg by mouth daily.    Marland Kitchen atorvastatin (LIPITOR) 80 MG tablet Take 1 tablet (80 mg total) by mouth daily at 6 PM. 30 tablet 6  . BRILINTA 90 MG TABS tablet Take 1 tablet (90 mg total) by mouth 2 (two) times daily. 60 tablet 6  . carvedilol (COREG) 6.25 MG tablet Take 1 tablet (6.25 mg total) by mouth 2 (two) times daily with a meal. 60 tablet 3  . sacubitril-valsartan (ENTRESTO) 24-26 MG Take 1 tablet by mouth 2 (two) times daily. 60 tablet 6  . spironolactone (ALDACTONE) 25 MG tablet Take 0.5 tablets (12.5 mg total) by mouth at bedtime. 15 tablet 3   No current facility-administered medications for this encounter.    BP 121/64   Pulse 69   Wt 205 lb 8 oz (93.2 kg)   SpO2 100%   BMI 27.87 kg/m  General: NAD Neck: No JVD, no thyromegaly or thyroid nodule.  Lungs:  Clear to auscultation bilaterally with normal respiratory effort. CV: Nondisplaced PMI.  Heart regular S1/S2, no S3/S4, no murmur.  No peripheral edema.  No carotid bruit.  Normal pedal pulses.  Abdomen: Soft, nontender, no hepatosplenomegaly, no distention.  Skin: Intact without lesions or rashes.  Neurologic: Alert and oriented x 3.  Psych: Normal affect. Extremities: No clubbing or cyanosis.  HEENT: Normal.   Assessment/Plan: 1. CAD: Initial MI in 2017, 5 stents to RCA system.  Inferior MI in 5/19, DES to proximal/mid RCA but distal RCA and PLV/PDA remained occluded.  No good options for opening. Left coronary system had minimal disease. No ischemic chest pain, he is having some musculoskeletal chest pain from prior CPR.  - Continue ASA 81 daily and  ticagrelor 90 bid.  - Continue atorvastatin 80 mg daily.  - Refer to cardiac rehab.  2. Chronic systolic CHF: Ischemic cardiomyopathy.  NYHA class II, he is not volume overloaded on exam.  He has had some difficulty with orthostatic symptoms and fatigue with medication titration.   - Continue Entresto 24/26 bid.  - Increase Coreg to 6.25 mg bid.  - take spironolactone in the evening to minimize BP effect.  - I think he can stop Lasix at this point, keep for prn use.  - Repeat echo in 8/19, if EF remains < 35%, he will need an ICD.  3. VT: Pulseless VT with CPR peri-MI.  He underwent hypothermia protocol.  He is wearing a Lifevest.  - Continue Lifevest until followup echo in 8/19.  - Decrease amiodarone to 200 mg daily.  I will have him continue amiodarone until followup echo, will likely stop it at that point. Need to check LFTs and TSH (LFTs elevated in hospital but were trending down).  It is possible that his finger/toe tingling is neuropathy related to amiodarone.  We are decreasing amiodarone today.  If neuropathy symptoms worsen, would go ahead and stop amiodarone.   Followup with HF pharmacist in 2 wks and I will see him back in 1 month. He will stay out of work until his followup with me in 1 month.   Marca Ancona 08/17/2017

## 2017-08-16 NOTE — Patient Instructions (Signed)
Stop Furosemide  Increase Carvedilol 6.25 mg (1 tab), twice a day  Decrease Amiodarone 200 mg (1 tab) daily  Change Spironolactone 12.5 mg (1/2 tab) "every night"  You have been referred to Cardiac Rehab (they will call you)   Your physician recommends that you schedule a follow-up appointment in: Pharmacy Clinic and labs  Your physician recommends that you schedule a follow-up appointment in: 1 month with Dr. Shirlee Latch

## 2017-08-17 ENCOUNTER — Encounter (HOSPITAL_COMMUNITY): Payer: BLUE CROSS/BLUE SHIELD

## 2017-08-30 ENCOUNTER — Encounter: Payer: Self-pay | Admitting: Internal Medicine

## 2017-08-30 ENCOUNTER — Ambulatory Visit: Payer: BLUE CROSS/BLUE SHIELD | Admitting: Internal Medicine

## 2017-08-30 ENCOUNTER — Encounter (INDEPENDENT_AMBULATORY_CARE_PROVIDER_SITE_OTHER): Payer: Self-pay

## 2017-08-30 VITALS — BP 96/60 | HR 61 | Ht 72.0 in | Wt 204.0 lb

## 2017-08-30 DIAGNOSIS — I469 Cardiac arrest, cause unspecified: Secondary | ICD-10-CM | POA: Diagnosis not present

## 2017-08-30 DIAGNOSIS — I4901 Ventricular fibrillation: Secondary | ICD-10-CM

## 2017-08-30 NOTE — Patient Instructions (Addendum)
Medication Instructions:  Your physician recommends that you continue on your current medications as directed. Please refer to the Current Medication list given to you today.  Labwork: None ordered.  Testing/Procedures: None ordered.  Follow-Up: Your physician wants you to follow-up in: based on the results of your ECHO scheduled for 10/27/2017.  Any Other Special Instructions Will Be Listed Below (If Applicable).  If you need a refill on your cardiac medications before your next appointment, please call your pharmacy.

## 2017-08-30 NOTE — Progress Notes (Signed)
HPI Mr. Lee Moore returns today for ongoing evaluation and management of his ICM, s/p MI, complicated by VF arrest and shock. He is much improved. He has been wearing a life vest and no ICD shocks. He has not had chest pain or sob. No syncope.  No Known Allergies   Current Outpatient Medications  Medication Sig Dispense Refill  . amiodarone (PACERONE) 200 MG tablet Take 1 tablet (200 mg total) by mouth daily. 30 tablet 3  . aspirin EC 81 MG tablet Take 81 mg by mouth daily.    Marland Kitchen. atorvastatin (LIPITOR) 80 MG tablet Take 1 tablet (80 mg total) by mouth daily at 6 PM. 30 tablet 6  . BRILINTA 90 MG TABS tablet Take 1 tablet (90 mg total) by mouth 2 (two) times daily. 60 tablet 6  . carvedilol (COREG) 6.25 MG tablet Take 1 tablet (6.25 mg total) by mouth 2 (two) times daily with a meal. 60 tablet 3  . sacubitril-valsartan (ENTRESTO) 24-26 MG Take 1 tablet by mouth 2 (two) times daily. 60 tablet 6  . spironolactone (ALDACTONE) 25 MG tablet Take 0.5 tablets (12.5 mg total) by mouth at bedtime. 15 tablet 3   No current facility-administered medications for this visit.      Past Medical History:  Diagnosis Date  . MI (myocardial infarction) (HCC)     ROS:   All systems reviewed and negative except as noted in the HPI.   Past Surgical History:  Procedure Laterality Date  . CORONARY/GRAFT ACUTE MI REVASCULARIZATION N/A 07/30/2017   Procedure: Coronary/Graft Acute MI Revascularization;  Surgeon: Rinaldo CloudHarwani, Mohan, MD;  Location: [facility] INVASIVE CV LAB;  Service: Cardiovascular;  Laterality: N/A;  . LEFT HEART CATH AND CORONARY ANGIOGRAPHY N/A 07/30/2017   Procedure: LEFT HEART CATH AND CORONARY ANGIOGRAPHY;  Surgeon: Rinaldo CloudHarwani, Mohan, MD;  Location: [facility] INVASIVE CV LAB;  Service: Cardiovascular;  Laterality: N/A;     No family history on file.   Social History   Socioeconomic History  . Marital status: Married    Spouse name: Not on file  . Number of children: Not on file  . Years of  education: Not on file  . Highest education level: Not on file  Occupational History  . Not on file  Social Needs  . Financial resource strain: Not on file  . Food insecurity:    Worry: Not on file    Inability: Not on file  . Transportation needs:    Medical: Not on file    Non-medical: Not on file  Tobacco Use  . Smoking status: Never Smoker  . Smokeless tobacco: Never Used  Substance and Sexual Activity  . Alcohol use: Not Currently    Comment: unknown  . Drug use: Never  . Sexual activity: Yes  Lifestyle  . Physical activity:    Days per week: Not on file    Minutes per session: Not on file  . Stress: Not on file  Relationships  . Social connections:    Talks on phone: Not on file    Gets together: Not on file    Attends religious service: Not on file    Active member of club or organization: Not on file    Attends meetings of clubs or organizations: Not on file    Relationship status: Not on file  . Intimate partner violence:    Fear of current or ex partner: Not on file    Emotionally abused: Not on file    Physically  abused: Not on file    Forced sexual activity: Not on file  Other Topics Concern  . Not on file  Social History Narrative   Senior VP at [employer]     BP 96/60   Pulse 61   Ht 6' (1.829 m)   Wt 204 lb (92.5 kg)   BMI 27.67 kg/m   Physical Exam:  Well appearing 42 yo man, NAD HEENT: Unremarkable Neck:  No JVD, no thyromegally Lymphatics:  No adenopathy Back:  No CVA tenderness Lungs:  Clear with no wheezes, life vest is in place. HEART:  Regular rate rhythm, no murmurs, no rubs, no clicks Abd:  soft, positive bowel sounds, no organomegally, no rebound, no guarding Ext:  2 plus pulses, no edema, no cyanosis, no clubbing Skin:  No rashes no nodules Neuro:  CN II through XII intact, motor grossly intact  EKG - NSR with old anterior MI   Assess/Plan: 1. VF arrest - his VF occurred in the setting of an acute MI with ongoing ischemia.  He is doing well on amiodarone. He will undergo 2D echo in 2 months. If his EF is less than 36%, ICD will be recommended for primary prevention.  2. Chronic systolic heart failure- he is on maximal medical therapy. We will recheck his echo.  3. ICM - he denies anginal symptoms. He is walking.  4. HTN - his blood pressure is on the low side. He will continue his current meds.  Leonia Reeves.D.

## 2017-08-31 ENCOUNTER — Ambulatory Visit (HOSPITAL_COMMUNITY)
Admission: RE | Admit: 2017-08-31 | Discharge: 2017-08-31 | Disposition: A | Discharge status: Home / Self Care | Payer: BLUE CROSS/BLUE SHIELD | Source: Ambulatory Visit | Attending: Internal Medicine | Admitting: Internal Medicine

## 2017-08-31 DIAGNOSIS — I252 Old myocardial infarction: Secondary | ICD-10-CM | POA: Diagnosis not present

## 2017-08-31 DIAGNOSIS — I2510 Atherosclerotic heart disease of native coronary artery without angina pectoris: Secondary | ICD-10-CM | POA: Diagnosis not present

## 2017-08-31 DIAGNOSIS — I472 Ventricular tachycardia: Secondary | ICD-10-CM | POA: Diagnosis not present

## 2017-08-31 DIAGNOSIS — Z09 Encounter for follow-up examination after completed treatment for conditions other than malignant neoplasm: Secondary | ICD-10-CM | POA: Diagnosis not present

## 2017-08-31 DIAGNOSIS — I255 Ischemic cardiomyopathy: Secondary | ICD-10-CM | POA: Diagnosis present

## 2017-08-31 DIAGNOSIS — I5022 Chronic systolic (congestive) heart failure: Secondary | ICD-10-CM | POA: Diagnosis not present

## 2017-08-31 LAB — COMPREHENSIVE METABOLIC PANEL
ALK PHOS: 102 U/L (ref 38–126)
ALT: 30 U/L (ref 17–63)
AST: 17 U/L (ref 15–41)
Albumin: 4 g/dL (ref 3.5–5.0)
Anion gap: 6 (ref 5–15)
BILIRUBIN TOTAL: 0.5 mg/dL (ref 0.3–1.2)
BUN: 13 mg/dL (ref 6–20)
CALCIUM: 9 mg/dL (ref 8.9–10.3)
CO2: 23 mmol/L (ref 22–32)
Chloride: 110 mmol/L (ref 101–111)
Creatinine, Ser: 1.16 mg/dL (ref 0.61–1.24)
GFR calc non Af Amer: >60 mL/min (ref >60)
GLUCOSE: 97 mg/dL (ref 65–99)
Potassium: 4.3 mmol/L (ref 3.5–5.1)
Sodium: 139 mmol/L (ref 135–145)
TOTAL PROTEIN: 7.1 g/dL (ref 6.5–8.1)

## 2017-08-31 LAB — TSH: TSH: 1.858 u[IU]/mL (ref 0.350–4.500)

## 2017-08-31 MED ORDER — TRAMADOL HCL 50 MG PO TABS: 50.0000 mg | ORAL_TABLET | Freq: Every evening | ORAL | 0 refills | Status: DC | PRN

## 2017-08-31 MED ORDER — SPIRONOLACTONE 25 MG PO TABS: 25.0000 mg | ORAL_TABLET | Freq: Every day | ORAL | 3 refills | Status: DC

## 2017-08-31 NOTE — Patient Instructions (Addendum)
Increase Spironolactone 25mg  = 1 tab at bedtime Continue other medications Follow up with Dr Shirlee LatchMcLean 09/15/17 at 1030

## 2017-08-31 NOTE — Progress Notes (Signed)
HF Cardiology: Dr. Shirlee LatchMcLean    HPI:    42 yo with history of CAD s/p MI in 2017 and again in 5/19 presents for followup of CHF and CAD.  Patient had initial MI in [place] in 2017, he reports a total of 5 stents to the RCA system. He recently moved to [place] with work.  In 5/19, he had inferior STEMI complicated by multiple episodes of pulseless VT.  He had CPR and ultimately when to the cath lab.  This showed occluded proximal RCA but minimal disease on the left.  The proximal to mid RCA were stented, restoring flow to an acute marginal.  However, the distal RCA, PLV and PDA remained occluded. There were collaterals to the PLV from the left suggesting pre-existing disease.  He underwent hypothermia protocol and had full neurologic recovery from arrest.  Echo in 5/19 showed EF 30-35%.  Cardiac medications were titrated up and he was discharged with a Lifevest.    Since discharge, he has been stable. Some ongoing pleuritic soreness in his chest, likely related to CPR.  He has some fatigue and does get lightheaded on occasion if he stands too fast.  No dyspnea walking on flat ground or up stairs.  No orthopnea/PND.  No BRBPR/melena. He has had a depressed mood and some anxiety since his MI. Not sleeping well. He has some tingling in his fingertips and toes.    08/31/17 presents to initial Pharmacist Medication titration clinic.  At last office visit Coreg was increased to 6.25mg  BID, furosemide changed to prn  - he has not taken any and amiodarone was decreased to 200mg  daily.  He feels good and can do all activities with little SOB or weakness, no lightheadedness.    . Shortness of breath/dyspnea on exertion? no  . Orthopnea/PND? no . Edema? no . Lightheadedness/dizziness? no . Daily weights at home? yes . Blood pressure/heart rate monitoring at home? yes . Following low-sodium/fluid-restricted diet? yes  HF Medications: Carvedilol 6.25mg  bid Entresto 24-26mg  BID Spironolactone 12.5mg   daily Furosemide 20mg  PRN weight gain  Has the patient been experiencing any side effects to the medications prescribed?  yes  Does the patient have any problems obtaining medications due to transportation or finances?   no  Understanding of regimen: good Understanding of indications: good Potential of compliance: good Patient understands to avoid NSAIDs. Patient understands to avoid decongestants.    Pertinent Lab Values: . Serum creatinine 1.16 stable, CO2 23, Potassium 4.3 stable , Sodium 139   Vital Signs: . Weight: 211lb  (dry weight: 204lb at home) . Blood pressure: 98/60  . Heart rate: 60 . O2 98%  Assessment: 1. Chronicsystolic CHF (EF 11%30%), due to ICM. NYHA class IIsymptoms. -volume status stable - BP soft stable and no dizziness or lightheadedness, Ct stable, K stable will increase Spironolactone 25mg  daily -continue current medications Carvedilol 6.25mg  bid Entresto 24-26mg  BID Furosemide 20mg  PRN weight gain - Basic disease state pathophysiology, medication indication, mechanism and side effects reviewed at length with patient and he verbalized understanding  2) . CAD: Initial MI in 2017, 5 stents to RCA system.  Inferior MI in 5/19, DES to proximal/mid RCA but distal RCA and PLV/PDA remained occluded.  No good options for opening. Left coronary system had minimal disease. No ischemic chest pain, he is having some musculoskeletal chest pain from prior CPR.  - Continue ASA 81 daily and ticagrelor 90 bid.  - Continue atorvastatin 80 mg daily.  - Refer to cardiac rehab.  3)  VT: Pulseless VT with CPR peri-MI.  He underwent hypothermia protocol.  He is wearing a Lifevest.  - Continue Lifevest until followup echo in 8/19.  - Decrease amiodarone to 200 mg daily.  I will have him continue amiodarone until followup echo, will likely stop it at that point. Need to check LFTs and TSH (LFTs elevated in hospital but were trending down).  It is possible that his finger/toe  tingling is neuropathy related to amiodarone.  We are decreasing amiodarone today.  If neuropathy symptoms worsen, may stop amiodarone. Per MD   Plan: 1) Medication changes: Based on clinical presentation, vital signs BP soft stable, no lightheadedness or dizziness and recent labs Cr stable, K stable will increase spironolactone 25mg  Daily  Complains of rib pain from CPR especially at night - tramadol 50mg  HS prn x1  2) Labs: at next visit  3) Follow-up: 09/15/17 with Dr. Alanda Amass.D. CPP, BCPS Clinical Pharmacist [phone] 08/31/2017 3:21 PM

## 2017-09-02 ENCOUNTER — Telehealth (HOSPITAL_COMMUNITY): Payer: Self-pay

## 2017-09-02 NOTE — Telephone Encounter (Signed)
Called and spoke with wife of patient - Scheduled orientation on 11/01/17 at 7:30am. Patient will attend the 6:45am exc class. Mailed packet.

## 2017-09-07 ENCOUNTER — Other Ambulatory Visit (HOSPITAL_COMMUNITY): Payer: Self-pay | Admitting: Cardiology

## 2017-09-15 ENCOUNTER — Ambulatory Visit (HOSPITAL_COMMUNITY)
Admission: RE | Admit: 2017-09-15 | Discharge: 2017-09-15 | Disposition: A | Discharge status: Home / Self Care | Payer: BLUE CROSS/BLUE SHIELD | Source: Ambulatory Visit | Attending: Cardiology | Admitting: Cardiology

## 2017-09-15 VITALS — BP 130/82 | HR 63 | Wt 212.8 lb

## 2017-09-15 DIAGNOSIS — Z955 Presence of coronary angioplasty implant and graft: Secondary | ICD-10-CM | POA: Diagnosis not present

## 2017-09-15 DIAGNOSIS — Z7982 Long term (current) use of aspirin: Secondary | ICD-10-CM | POA: Insufficient documentation

## 2017-09-15 DIAGNOSIS — I255 Ischemic cardiomyopathy: Secondary | ICD-10-CM | POA: Insufficient documentation

## 2017-09-15 DIAGNOSIS — I469 Cardiac arrest, cause unspecified: Secondary | ICD-10-CM

## 2017-09-15 DIAGNOSIS — I2510 Atherosclerotic heart disease of native coronary artery without angina pectoris: Secondary | ICD-10-CM | POA: Insufficient documentation

## 2017-09-15 DIAGNOSIS — N182 Chronic kidney disease, stage 2 (mild): Secondary | ICD-10-CM | POA: Insufficient documentation

## 2017-09-15 DIAGNOSIS — I5022 Chronic systolic (congestive) heart failure: Secondary | ICD-10-CM | POA: Diagnosis not present

## 2017-09-15 DIAGNOSIS — I4901 Ventricular fibrillation: Secondary | ICD-10-CM | POA: Diagnosis not present

## 2017-09-15 DIAGNOSIS — Z79899 Other long term (current) drug therapy: Secondary | ICD-10-CM | POA: Insufficient documentation

## 2017-09-15 DIAGNOSIS — I252 Old myocardial infarction: Secondary | ICD-10-CM | POA: Insufficient documentation

## 2017-09-15 LAB — BASIC METABOLIC PANEL
Anion gap: 6 (ref 5–15)
BUN: 12 mg/dL (ref 6–20)
CO2: 23 mmol/L (ref 22–32)
CREATININE: 1.08 mg/dL (ref 0.61–1.24)
Calcium: 9.3 mg/dL (ref 8.9–10.3)
Chloride: 110 mmol/L (ref 101–111)
GFR calc Af Amer: >60 mL/min (ref >60)
Glucose, Bld: 105 mg/dL — ABNORMAL HIGH (ref 65–99)
Potassium: 4.1 mmol/L (ref 3.5–5.1)
SODIUM: 139 mmol/L (ref 135–145)

## 2017-09-15 MED ORDER — AMIODARONE HCL 200 MG PO TABS
100.0000 mg | ORAL_TABLET | Freq: Every day | ORAL | 3 refills | Status: DC
Start: 2017-09-15 — End: 2017-10-28

## 2017-09-15 MED ORDER — CARVEDILOL 6.25 MG PO TABS: 9.3750 mg | ORAL_TABLET | Freq: Two times a day (BID) | ORAL | 3 refills | Status: DC

## 2017-09-15 NOTE — Progress Notes (Signed)
HF Cardiology: Dr. Shirlee Latch  42 yo with history of CAD s/p MI in 2017 and again in 5/19 presents for followup of CHF and CAD.  Patient had initial MI in [place] in 2017, he reports a total of 5 stents to the RCA system. He recently moved to [place] with work.  In 5/19, he had inferior STEMI complicated by multiple episodes of pulseless VT.  He had CPR and ultimately when to the cath lab.  This showed occluded proximal RCA but minimal disease on the left.  The proximal to mid RCA were stented, restoring flow to an acute marginal.  However, the distal RCA, PLV and PDA remained occluded. There were collaterals to the PLV from the left suggesting pre-existing disease.  He underwent hypothermia protocol and had full neurologic recovery from arrest.  Echo in 5/19 showed EF 30-35%.  Cardiac medications were titrated up and he was discharged with a Lifevest.    He has been doing reasonably well recently.  He does some walking for exercise.  He fatigues more easily than in the past but denies dyspnea.  Some lightheadedness if he stands too fast. No chest pain.  No orthopnea/PND.  Weight is up 7 lbs.  SBP 130 in the office today but it has been in the 100s at home.  He is wearing a Lifevest. He still has mild tingling in his fingers and toes.    Labs (5/19): K 4.2 => 4.8, creatinine 1.54 => 1.38, hgb 11.7, AST 97, ALT 151 Labs (6/19): K 4.3, creatinine 1.16, LFTs normal  PMH: 1. CAD: MI in 2017, per report patient had a total of 5 stents in the RCA system.  - Inferior STEMI 5/19: LHC showed occluded RCA with relatively clear left coronary system. DES to proximal-mid RCA restoring flow to an acute marginal, however, the distal RCA/PLV/PDA remained occluded.  There were collaterals to the PLV from the left.   2. VT: Patient had pulseless VT in setting of inferior STEMI.   3. Chronic systolic CHF: Ischemic cardiomyopathy.   - Echo (5/19): EF 30-35%, inferior WMA.   4. CKD: Stage II.   FH: No premature CAD.    SH: Married with a child.  Works for [employer].  Moved to [place] from [place].  No smoking, rare ETOH.   ROS: All systems reviewed and negative except as per HPI.   Current Outpatient Medications  Medication Sig Dispense Refill  . amiodarone (PACERONE) 200 MG tablet Take 0.5 tablets (100 mg total) by mouth daily. 30 tablet 3  . aspirin EC 81 MG tablet Take 81 mg by mouth daily.    Marland Kitchen atorvastatin (LIPITOR) 80 MG tablet Take 1 tablet (80 mg total) by mouth daily at 6 PM. 30 tablet 6  . BRILINTA 90 MG TABS tablet Take 1 tablet (90 mg total) by mouth 2 (two) times daily. 60 tablet 6  . carvedilol (COREG) 6.25 MG tablet Take 1.5 tablets (9.375 mg total) by mouth 2 (two) times daily with a meal. 90 tablet 3  . sacubitril-valsartan (ENTRESTO) 24-26 MG Take 1 tablet by mouth 2 (two) times daily. 60 tablet 6  . spironolactone (ALDACTONE) 25 MG tablet Take 1 tablet (25 mg total) by mouth at bedtime. 30 tablet 3  . traMADol (ULTRAM) 50 MG tablet Take 1 tablet (50 mg total) by mouth at bedtime as needed for moderate pain. (Patient not taking: Reported on 09/15/2017) 30 tablet 0   No current facility-administered medications for this encounter.    BP 130/82  Pulse 63   Wt 212 lb 12.8 oz (96.5 kg)   SpO2 97%   BMI 28.86 kg/m  General: NAD Neck: No JVD, no thyromegaly or thyroid nodule.  Lungs: Clear to auscultation bilaterally with normal respiratory effort. CV: Nondisplaced PMI.  Heart regular S1/S2, no S3/S4, no murmur.  No peripheral edema.  No carotid bruit.  Normal pedal pulses.  Abdomen: Soft, nontender, no hepatosplenomegaly, no distention.  Skin: Intact without lesions or rashes.  Neurologic: Alert and oriented x 3.  Psych: Normal affect. Extremities: No clubbing or cyanosis.  HEENT: Normal.   Assessment/Plan: 1. CAD: Initial MI in 2017, 5 stents to RCA system.  Inferior MI in 5/19, DES to proximal/mid RCA but distal RCA and PLV/PDA remained occluded.  No good options for opening.  Left coronary system had minimal disease. No ischemic chest pain.  - Continue ASA 81 daily and ticagrelor 90 bid.  - Continue atorvastatin 80 mg daily, check lipids today.  - He will start cardiac rehab soon.  2. Chronic systolic CHF: Ischemic cardiomyopathy.  NYHA class II, he is not volume overloaded on exam.  Rare orthostatic symptoms and mild fatigue with medication titration.   - Continue Entresto 24/26 bid.  - Increase Coreg to 9.375 mg bid.   - Continue spironolactone 25 mg daily.  - Not volume overloaded, no need for Lasix. - Repeat echo in 8/19, if EF remains < 35%, he will need an ICD. If EF 35-40%, would consider EP study.  3. VT: Pulseless VT with CPR peri-MI.  He underwent hypothermia protocol.  He is wearing a Lifevest.  - Continue Lifevest until followup echo in 8/19.  - Decrease amiodarone to 100 mg daily.  I will have him continue amiodarone until followup echo, will likely stop it at that point. Recent LFTs and TSH normal.  It is possible that his finger/toe tingling is neuropathy related to amiodarone.  We are decreasing amiodarone today.  If neuropathy symptoms worsen, would go ahead and stop amiodarone.   Followup with HF pharmacist in 1 month and I will see him back in 8/19 with echo.    Marca AnconaDalton Kewanda Poland 09/15/2017

## 2017-09-15 NOTE — Patient Instructions (Signed)
Decrease Amiodarone to 100 mg (1/2 tab) daily  Increase Carvedilol to 9.375 mg (1 & 1/2 tab) Twice daily   Labs today  Please follow up with Cicero DuckErika, our heart failure pharmacist in 1 month  Your physician recommends that you schedule a follow-up appointment in: August with echocardiogram

## 2017-10-04 ENCOUNTER — Telehealth (HOSPITAL_COMMUNITY): Payer: Self-pay

## 2017-10-10 NOTE — Telephone Encounter (Signed)
Cardiac Rehab Medication Review by a Pharmacist  Does the patient  feel that his/her medications are working for him/her?  yes  Has the patient been experiencing any side effects to the medications prescribed?  no  Does the patient measure his/her own blood pressure or blood glucose at home?  Yes, but wife does not recall what readings have been  Does the patient have any problems obtaining medications due to transportation or finances?   no  Understanding of regimen: fair Understanding of indications: fair Potential of compliance: good    Pharmacist comments: N/A    Lee MarketMelissa Chonda Moore, PharmD PGY1 Pharmacy Resident Phone [phone] 10/10/2017     4:17 PM

## 2017-10-11 ENCOUNTER — Ambulatory Visit (HOSPITAL_COMMUNITY): Payer: BLUE CROSS/BLUE SHIELD

## 2017-10-11 ENCOUNTER — Telehealth (HOSPITAL_COMMUNITY): Payer: Self-pay

## 2017-10-11 NOTE — Telephone Encounter (Signed)
Patient called to [facility] orientation for Cardiac Rehab as he did not feel well. Called and spoke with wife to see if patient is interested in rescheduling. Wife stated he is currently at work and when he gets home she will talk with him. Will follow up if no phone call. Paperwork in red chart bin.

## 2017-10-17 ENCOUNTER — Ambulatory Visit (HOSPITAL_COMMUNITY): Payer: BLUE CROSS/BLUE SHIELD

## 2017-10-19 ENCOUNTER — Ambulatory Visit (HOSPITAL_COMMUNITY): Payer: BLUE CROSS/BLUE SHIELD

## 2017-10-21 ENCOUNTER — Telehealth (HOSPITAL_COMMUNITY): Payer: Self-pay

## 2017-10-21 ENCOUNTER — Ambulatory Visit (HOSPITAL_COMMUNITY): Payer: BLUE CROSS/BLUE SHIELD

## 2017-10-21 NOTE — Telephone Encounter (Signed)
Attempted to contact patient to see if he is interested in rescheduling orientation - vm is full.

## 2017-10-24 ENCOUNTER — Ambulatory Visit (HOSPITAL_COMMUNITY): Payer: BLUE CROSS/BLUE SHIELD

## 2017-10-26 ENCOUNTER — Ambulatory Visit (HOSPITAL_COMMUNITY): Payer: BLUE CROSS/BLUE SHIELD

## 2017-10-27 ENCOUNTER — Other Ambulatory Visit: Payer: Self-pay

## 2017-10-27 ENCOUNTER — Ambulatory Visit (HOSPITAL_COMMUNITY): Payer: BLUE CROSS/BLUE SHIELD | Attending: Cardiology

## 2017-10-27 ENCOUNTER — Encounter (INDEPENDENT_AMBULATORY_CARE_PROVIDER_SITE_OTHER): Payer: Self-pay

## 2017-10-27 DIAGNOSIS — I5022 Chronic systolic (congestive) heart failure: Secondary | ICD-10-CM | POA: Diagnosis not present

## 2017-10-27 DIAGNOSIS — I2510 Atherosclerotic heart disease of native coronary artery without angina pectoris: Secondary | ICD-10-CM | POA: Diagnosis not present

## 2017-10-27 DIAGNOSIS — I255 Ischemic cardiomyopathy: Secondary | ICD-10-CM | POA: Insufficient documentation

## 2017-10-28 ENCOUNTER — Ambulatory Visit (HOSPITAL_COMMUNITY): Payer: BLUE CROSS/BLUE SHIELD

## 2017-10-28 ENCOUNTER — Ambulatory Visit (HOSPITAL_COMMUNITY)
Admission: RE | Admit: 2017-10-28 | Discharge: 2017-10-28 | Disposition: A | Discharge status: Home / Self Care | Payer: BLUE CROSS/BLUE SHIELD | Source: Ambulatory Visit | Attending: Cardiology | Admitting: Cardiology

## 2017-10-28 ENCOUNTER — Telehealth (HOSPITAL_COMMUNITY): Payer: Self-pay | Admitting: *Deleted

## 2017-10-28 VITALS — BP 105/71 | HR 75 | Wt 211.4 lb

## 2017-10-28 DIAGNOSIS — I2510 Atherosclerotic heart disease of native coronary artery without angina pectoris: Secondary | ICD-10-CM | POA: Diagnosis not present

## 2017-10-28 DIAGNOSIS — I252 Old myocardial infarction: Secondary | ICD-10-CM | POA: Diagnosis not present

## 2017-10-28 DIAGNOSIS — Z79899 Other long term (current) drug therapy: Secondary | ICD-10-CM | POA: Diagnosis not present

## 2017-10-28 DIAGNOSIS — Z955 Presence of coronary angioplasty implant and graft: Secondary | ICD-10-CM | POA: Insufficient documentation

## 2017-10-28 DIAGNOSIS — N182 Chronic kidney disease, stage 2 (mild): Secondary | ICD-10-CM | POA: Diagnosis not present

## 2017-10-28 DIAGNOSIS — I255 Ischemic cardiomyopathy: Secondary | ICD-10-CM | POA: Diagnosis not present

## 2017-10-28 DIAGNOSIS — Z7902 Long term (current) use of antithrombotics/antiplatelets: Secondary | ICD-10-CM | POA: Insufficient documentation

## 2017-10-28 DIAGNOSIS — Z7982 Long term (current) use of aspirin: Secondary | ICD-10-CM | POA: Insufficient documentation

## 2017-10-28 DIAGNOSIS — I472 Ventricular tachycardia, unspecified: Secondary | ICD-10-CM

## 2017-10-28 DIAGNOSIS — R570 Cardiogenic shock: Secondary | ICD-10-CM

## 2017-10-28 DIAGNOSIS — I5022 Chronic systolic (congestive) heart failure: Secondary | ICD-10-CM | POA: Diagnosis not present

## 2017-10-28 LAB — BASIC METABOLIC PANEL
ANION GAP: 7 (ref 5–15)
BUN: 10 mg/dL (ref 6–20)
CALCIUM: 9.2 mg/dL (ref 8.9–10.3)
CO2: 23 mmol/L (ref 22–32)
Chloride: 109 mmol/L (ref 98–111)
Creatinine, Ser: 1.11 mg/dL (ref 0.61–1.24)
GFR calc Af Amer: >60 mL/min (ref >60)
GLUCOSE: 113 mg/dL — AB (ref 70–99)
Potassium: 4.3 mmol/L (ref 3.5–5.1)
Sodium: 139 mmol/L (ref 135–145)

## 2017-10-28 LAB — LIPID PANEL
Cholesterol: 91 mg/dL (ref 0–200)
HDL: 51 mg/dL (ref >40)
LDL Cholesterol: 29 mg/dL (ref 0–99)
Total CHOL/HDL Ratio: 1.8 RATIO
Triglycerides: 53 mg/dL (ref <150)
VLDL: 11 mg/dL (ref 0–40)

## 2017-10-28 NOTE — Progress Notes (Signed)
[employer] life vest discontinued per Dr. Shirlee LatchMcLean. DC order called to [employer], patient made aware that company will be calling to retrieve equipment.  Ave FilterBradley, Megan Genevea, RN

## 2017-10-28 NOTE — Telephone Encounter (Signed)
Result Notes for ECHOCARDIOGRAM COMPLETE   Notes recorded by Georgina PeerFarver, Kalif Kattner S, RN on 10/28/2017 at 9:02 AM EDT Spoke with patient and his wife and they're both pleased to hear the good news. ------  Notes recorded by Laurey MoraleMcLean, Dalton S, MD on 10/27/2017 at 9:29 PM EDT EF is improved to near-normal. He will not need ICD. He can take off Lifevest.

## 2017-10-28 NOTE — Patient Instructions (Signed)
Discontinue Lifevest. Call [phone] to have company retrieve equipment.  STOP Amiodarone.  Routine lab work today. Will notify you of abnormal results, otherwise no news is good news!  Follow up 3 months with Dr. Shirlee LatchMcLean.  ______________________________________________________________ [place] Ridge[place] Code: 1800  Take all medication as prescribed the day of your appointment. Bring all medications with you to your appointment.  Do the following things EVERYDAY: 1) Weigh yourself in the morning before breakfast. Write it down and keep it in a log. 2) Take your medicines as prescribed 3) Eat low salt foods-Limit salt (sodium) to 2000 mg per day.  4) Stay as active as you can everyday 5) Limit all fluids for the day to less than 2 liters

## 2017-10-29 NOTE — Progress Notes (Signed)
HF Cardiology: Dr. Shirlee Latch  42 yo with history of CAD s/p MI in 2017 and again in 5/19 presents for followup of CHF and CAD.  Patient had initial MI in [place] in 2017, he reports a total of 5 stents to the RCA system. He recently moved to [place] with work.  In 5/19, he had inferior STEMI complicated by multiple episodes of pulseless VT.  He had CPR and ultimately when to the cath lab.  This showed occluded proximal RCA but minimal disease on the left.  The proximal to mid RCA were stented, restoring flow to an acute marginal.  However, the distal RCA, PLV and PDA remained occluded. There were collaterals to the PLV from the left suggesting pre-existing disease.  He underwent hypothermia protocol and had full neurologic recovery from arrest.  Echo in 5/19 showed EF 30-35%.  Cardiac medications were titrated up and he was discharged with a Lifevest.    Repeat echo in 7/19 showed EF improved to 50-55%.    He has been doing well.  Weight down 1 lb.  No chest pain.  No exertional dyspnea.  He has been back at work for 2 weeks with no problems.  Tingling in his fingers has improved.     Labs (5/19): K 4.2 => 4.8, creatinine 1.54 => 1.38, hgb 11.7, AST 97, ALT 151 Labs (6/19): K 4.3, creatinine 1.16 => 1.08, LFTs normal  PMH: 1. CAD: MI in 2017, per report patient had a total of 5 stents in the RCA system.  - Inferior STEMI 5/19: LHC showed occluded RCA with relatively clear left coronary system. DES to proximal-mid RCA restoring flow to an acute marginal, however, the distal RCA/PLV/PDA remained occluded.  There were collaterals to the PLV from the left.   2. VT: Patient had pulseless VT in setting of inferior STEMI.   3. Chronic systolic CHF: Ischemic cardiomyopathy.   - Echo (5/19): EF 30-35%, inferior WMA.   - Echo (7/19): EF 50-55% 4. CKD: Stage II.   FH: No premature CAD.   SH: Married with a child.  Works for [employer].  Moved to [place] from [place].  No smoking, rare ETOH.   ROS: All  systems reviewed and negative except as per HPI.   Current Outpatient Medications  Medication Sig Dispense Refill  . aspirin EC 81 MG tablet Take 81 mg by mouth daily.    Marland Kitchen atorvastatin (LIPITOR) 80 MG tablet Take 1 tablet (80 mg total) by mouth daily at 6 PM. 30 tablet 6  . BRILINTA 90 MG TABS tablet Take 1 tablet (90 mg total) by mouth 2 (two) times daily. 60 tablet 6  . carvedilol (COREG) 6.25 MG tablet Take 1.5 tablets (9.375 mg total) by mouth 2 (two) times daily with a meal. 90 tablet 3  . sacubitril-valsartan (ENTRESTO) 24-26 MG Take 1 tablet by mouth 2 (two) times daily. 60 tablet 6  . spironolactone (ALDACTONE) 25 MG tablet Take 1 tablet (25 mg total) by mouth at bedtime. 30 tablet 3  . traMADol (ULTRAM) 50 MG tablet Take 1 tablet (50 mg total) by mouth at bedtime as needed for moderate pain. (Patient not taking: Reported on 09/15/2017) 30 tablet 0   No current facility-administered medications for this encounter.    BP 105/71   Pulse 75   Wt 211 lb 6.4 oz (95.9 kg)   SpO2 100%   BMI 28.67 kg/m  General: NAD Neck: No JVD, no thyromegaly or thyroid nodule.  Lungs: Clear to auscultation bilaterally  with normal respiratory effort. CV: Nondisplaced PMI.  Heart regular S1/S2, no S3/S4, no murmur.  No peripheral edema.  No carotid bruit.  Normal pedal pulses.  Abdomen: Soft, nontender, no hepatosplenomegaly, no distention.  Skin: Intact without lesions or rashes.  Neurologic: Alert and oriented x 3.  Psych: Normal affect. Extremities: No clubbing or cyanosis.  HEENT: Normal.   Assessment/Plan: 1. CAD: Initial MI in 2017, 5 stents to RCA system.  Inferior MI in 5/19, DES to proximal/mid RCA but distal RCA and PLV/PDA remained occluded.  No good options for opening. Left coronary system had minimal disease. No ischemic chest pain.  - Continue ASA 81 daily and ticagrelor 90 bid.  - Continue atorvastatin 80 mg daily, needs lipids today.  2. Chronic systolic CHF: Ischemic  cardiomyopathy.  NYHA class II, he is not volume overloaded on exam.  Echo in 7/19 showed EF improved to 50-55%.    - Continue Entresto 24/26 bid.  - Continue Coreg 9.375 mg bid.   - Continue spironolactone 25 mg daily.  - Not volume overloaded, no need for Lasix. - BMET today.  - EF is out of range for ICD.  3. VT: Pulseless VT with CPR peri-MI.  He underwent hypothermia protocol.  EF has recovered to 50-55%.   - He can remove Lifevest and stop amiodarone.   Followup in 4 months.    Lee Moore 10/29/2017

## 2017-10-31 ENCOUNTER — Ambulatory Visit (HOSPITAL_COMMUNITY): Payer: BLUE CROSS/BLUE SHIELD

## 2017-11-01 ENCOUNTER — Ambulatory Visit (HOSPITAL_COMMUNITY): Payer: BLUE CROSS/BLUE SHIELD

## 2017-11-02 ENCOUNTER — Ambulatory Visit (HOSPITAL_COMMUNITY): Payer: BLUE CROSS/BLUE SHIELD

## 2017-11-04 ENCOUNTER — Ambulatory Visit (HOSPITAL_COMMUNITY): Payer: BLUE CROSS/BLUE SHIELD

## 2017-11-07 ENCOUNTER — Ambulatory Visit (HOSPITAL_COMMUNITY): Payer: BLUE CROSS/BLUE SHIELD

## 2017-11-09 ENCOUNTER — Ambulatory Visit (HOSPITAL_COMMUNITY): Payer: BLUE CROSS/BLUE SHIELD

## 2017-11-11 ENCOUNTER — Emergency Department (HOSPITAL_BASED_OUTPATIENT_CLINIC_OR_DEPARTMENT_OTHER): Payer: BLUE CROSS/BLUE SHIELD

## 2017-11-11 ENCOUNTER — Ambulatory Visit (HOSPITAL_COMMUNITY): Payer: BLUE CROSS/BLUE SHIELD

## 2017-11-11 ENCOUNTER — Encounter (HOSPITAL_BASED_OUTPATIENT_CLINIC_OR_DEPARTMENT_OTHER): Payer: Self-pay | Admitting: *Deleted

## 2017-11-11 ENCOUNTER — Other Ambulatory Visit: Payer: Self-pay

## 2017-11-11 ENCOUNTER — Inpatient Hospital Stay (HOSPITAL_BASED_OUTPATIENT_CLINIC_OR_DEPARTMENT_OTHER)
Admission: EM | Admit: 2017-11-11 | Discharge: 2017-11-15 | DRG: 394 | Disposition: A | Discharge status: Home / Self Care | Payer: BLUE CROSS/BLUE SHIELD | Attending: General Surgery | Admitting: General Surgery

## 2017-11-11 DIAGNOSIS — I255 Ischemic cardiomyopathy: Secondary | ICD-10-CM | POA: Diagnosis present

## 2017-11-11 DIAGNOSIS — I493 Ventricular premature depolarization: Secondary | ICD-10-CM | POA: Diagnosis present

## 2017-11-11 DIAGNOSIS — I2510 Atherosclerotic heart disease of native coronary artery without angina pectoris: Secondary | ICD-10-CM | POA: Diagnosis present

## 2017-11-11 DIAGNOSIS — Z7901 Long term (current) use of anticoagulants: Secondary | ICD-10-CM

## 2017-11-11 DIAGNOSIS — R1031 Right lower quadrant pain: Secondary | ICD-10-CM | POA: Diagnosis not present

## 2017-11-11 DIAGNOSIS — I252 Old myocardial infarction: Secondary | ICD-10-CM

## 2017-11-11 DIAGNOSIS — Z7982 Long term (current) use of aspirin: Secondary | ICD-10-CM

## 2017-11-11 DIAGNOSIS — Z955 Presence of coronary angioplasty implant and graft: Secondary | ICD-10-CM

## 2017-11-11 DIAGNOSIS — K5730 Diverticulosis of large intestine without perforation or abscess without bleeding: Secondary | ICD-10-CM | POA: Diagnosis present

## 2017-11-11 DIAGNOSIS — K3580 Unspecified acute appendicitis: Secondary | ICD-10-CM | POA: Diagnosis not present

## 2017-11-11 DIAGNOSIS — N182 Chronic kidney disease, stage 2 (mild): Secondary | ICD-10-CM | POA: Diagnosis present

## 2017-11-11 DIAGNOSIS — Z8674 Personal history of sudden cardiac arrest: Secondary | ICD-10-CM

## 2017-11-11 DIAGNOSIS — I130 Hypertensive heart and chronic kidney disease with heart failure and stage 1 through stage 4 chronic kidney disease, or unspecified chronic kidney disease: Secondary | ICD-10-CM | POA: Diagnosis present

## 2017-11-11 DIAGNOSIS — Z79899 Other long term (current) drug therapy: Secondary | ICD-10-CM

## 2017-11-11 DIAGNOSIS — I5022 Chronic systolic (congestive) heart failure: Secondary | ICD-10-CM | POA: Diagnosis present

## 2017-11-11 LAB — URINALYSIS, ROUTINE W REFLEX MICROSCOPIC
Bilirubin Urine: NEGATIVE
GLUCOSE, UA: NEGATIVE mg/dL
Ketones, ur: 15 mg/dL — AB
LEUKOCYTES UA: NEGATIVE
Nitrite: NEGATIVE
PH: 5.5 (ref 5.0–8.0)
Protein, ur: NEGATIVE mg/dL
Specific Gravity, Urine: >1.03 — ABNORMAL HIGH (ref 1.005–1.030)

## 2017-11-11 LAB — COMPREHENSIVE METABOLIC PANEL
ALT: 26 U/L (ref 0–44)
AST: 23 U/L (ref 15–41)
Albumin: 4.7 g/dL (ref 3.5–5.0)
Alkaline Phosphatase: 64 U/L (ref 38–126)
Anion gap: 10 (ref 5–15)
BUN: 13 mg/dL (ref 6–20)
CHLORIDE: 104 mmol/L (ref 98–111)
CO2: 26 mmol/L (ref 22–32)
CREATININE: 1.21 mg/dL (ref 0.61–1.24)
Calcium: 9.1 mg/dL (ref 8.9–10.3)
GFR calc Af Amer: >60 mL/min (ref >60)
Glucose, Bld: 97 mg/dL (ref 70–99)
POTASSIUM: 4.1 mmol/L (ref 3.5–5.1)
SODIUM: 140 mmol/L (ref 135–145)
Total Bilirubin: 0.8 mg/dL (ref 0.3–1.2)
Total Protein: 7.9 g/dL (ref 6.5–8.1)

## 2017-11-11 LAB — CBC
HEMATOCRIT: 44.5 % (ref 39.0–52.0)
Hemoglobin: 14.9 g/dL (ref 13.0–17.0)
MCH: 30 pg (ref 26.0–34.0)
MCHC: 33.5 g/dL (ref 30.0–36.0)
MCV: 89.5 fL (ref 78.0–100.0)
Platelets: 245 10*3/uL (ref 150–400)
RBC: 4.97 MIL/uL (ref 4.22–5.81)
RDW: 14.2 % (ref 11.5–15.5)
WBC: 11.3 10*3/uL — AB (ref 4.0–10.5)

## 2017-11-11 LAB — URINALYSIS, MICROSCOPIC (REFLEX): WBC, UA: NONE SEEN WBC/hpf (ref 0–5)

## 2017-11-11 LAB — LIPASE, BLOOD: LIPASE: 23 U/L (ref 11–51)

## 2017-11-11 MED ORDER — SODIUM CHLORIDE 0.9 % IV BOLUS
1000.0000 mL | Freq: Once | INTRAVENOUS | Status: AC
  Administered 2017-11-11: 1000 mL via INTRAVENOUS

## 2017-11-11 MED ORDER — ONDANSETRON HCL 4 MG/2ML IJ SOLN
4.0000 mg | Freq: Once | INTRAMUSCULAR | Status: AC
  Administered 2017-11-11: 4 mg via INTRAVENOUS
  Filled 2017-11-11: qty 2

## 2017-11-11 MED ORDER — ACETAMINOPHEN 500 MG PO TABS
1000.0000 mg | ORAL_TABLET | Freq: Once | ORAL | Status: AC
  Administered 2017-11-11: 1000 mg via ORAL
  Filled 2017-11-11: qty 2

## 2017-11-11 MED ORDER — METRONIDAZOLE IN NACL 5-0.79 MG/ML-% IV SOLN
500.0000 mg | Freq: Once | INTRAVENOUS | Status: AC
  Administered 2017-11-11: 500 mg via INTRAVENOUS
  Filled 2017-11-11: qty 100

## 2017-11-11 MED ORDER — IOPAMIDOL (ISOVUE-300) INJECTION 61%
100.0000 mL | Freq: Once | INTRAVENOUS | Status: AC | PRN
  Administered 2017-11-11: 100 mL via INTRAVENOUS

## 2017-11-11 MED ORDER — SODIUM CHLORIDE 0.9 % IV SOLN
2.0000 g | Freq: Once | INTRAVENOUS | Status: AC
  Administered 2017-11-11: 2 g via INTRAVENOUS
  Filled 2017-11-11: qty 20

## 2017-11-11 MED ORDER — MORPHINE SULFATE (PF) 4 MG/ML IV SOLN
4.0000 mg | Freq: Once | INTRAVENOUS | Status: AC
  Administered 2017-11-11: 4 mg via INTRAVENOUS
  Filled 2017-11-11: qty 1

## 2017-11-11 NOTE — ED Notes (Addendum)
Alert, NAD, calm, interactive, resps e/u, speaking in clear complete sentences, no dyspnea noted, skin W&D, VSS, c/o RLQ pain and tenderness, RLQ pain with LLQ palpation, RLQ worse with rebound and heel tap, vomited x1 today, last BM at 1100, last PO food at 1400, last PO fluid 2100, also reports chills, (denies: sob, known fever, nausea at this time, diarrhea or constipation, dizziness or visual changes). EDP into room. Here from [facility].

## 2017-11-11 NOTE — ED Provider Notes (Signed)
[facility] EMERGENCY DEPARTMENT Provider Note   CSN: [id number] Arrival date & time: 11/11/17  2108     History   Chief Complaint Chief Complaint  Patient presents with  . Abdominal Pain    HPI Lee Moore is a 42 y.o. male.  HPI   42 yo M with h/o MI s/p PCI here w/ RLQ pain. Pt states pain began this morning around 9 AM. He reports gradual onset of progressively worsening aching, throbbing pain. Pain began in RLQ/periumbilical area and has now migrated mostly to RLQ. He's had nausea, vomiting of NBNB emesis x 1. Pain worse with palpation. No change with eating though he has not had much of an appetite all day. No fevers. No diarrhea, constipation. No other complaints. No urinary symptoms.  Past Medical History:  Diagnosis Date  . MI (myocardial infarction) [facility](HCC)     Patient Active Problem List   Diagnosis Date Noted  . ST elevation myocardial infarction (STEMI) of inferior wall (HCC) 08/06/2017  . Essential hypertension 08/06/2017  . Ischemic cardiomyopathy 08/06/2017  . Cardiogenic shock (HCC)   . Cardiac arrest with ventricular fibrillation (HCC) 07/30/2017    Past Surgical History:  Procedure Laterality Date  . CORONARY/GRAFT ACUTE MI REVASCULARIZATION N/A 07/30/2017   Procedure: Coronary/Graft Acute MI Revascularization;  Surgeon: Rinaldo CloudHarwani, Mohan, MD;  Location: [facility] INVASIVE CV LAB;  Service: Cardiovascular;  Laterality: N/A;  . LEFT HEART CATH AND CORONARY ANGIOGRAPHY N/A 07/30/2017   Procedure: LEFT HEART CATH AND CORONARY ANGIOGRAPHY;  Surgeon: Rinaldo CloudHarwani, Mohan, MD;  Location: [facility] INVASIVE CV LAB;  Service: Cardiovascular;  Laterality: N/A;        Home Medications    Prior to Admission medications   Medication Sig Start Date End Date Taking? Authorizing Provider  aspirin EC 81 MG tablet Take 81 mg by mouth daily.    [provider]  atorvastatin (LIPITOR) 80 MG tablet Take 1 tablet (80 mg total) by mouth daily at 6 PM. 08/06/17   Strader,  GrenadaBrittany M, PA-C  BRILINTA 90 MG TABS tablet Take 1 tablet (90 mg total) by mouth 2 (two) times daily. 08/06/17   Strader, Lennart PallBrittany M, PA-C  carvedilol (COREG) 6.25 MG tablet Take 1.5 tablets (9.375 mg total) by mouth 2 (two) times daily with a meal. 09/15/17   Laurey MoraleMcLean, Dalton S, MD  sacubitril-valsartan (ENTRESTO) 24-26 MG Take 1 tablet by mouth 2 (two) times daily. 08/06/17   Strader, Lennart PallBrittany M, PA-C  spironolactone (ALDACTONE) 25 MG tablet Take 1 tablet (25 mg total) by mouth at bedtime. 08/31/17   Laurey MoraleMcLean, Dalton S, MD  traMADol (ULTRAM) 50 MG tablet Take 1 tablet (50 mg total) by mouth at bedtime as needed for moderate pain. Patient not taking: Reported on 09/15/2017 08/31/17   Bensimhon, Bevelyn Bucklesaniel R, MD    Family History History reviewed. No pertinent family history.  Social History Social History   Tobacco Use  . Smoking status: Never Smoker  . Smokeless tobacco: Never Used  Substance Use Topics  . Alcohol use: Not Currently    Comment: unknown  . Drug use: Never     Allergies   Patient has no known allergies.   Review of Systems Review of Systems  Constitutional: Positive for fatigue. Negative for chills and fever.  HENT: Negative for congestion and rhinorrhea.   Eyes: Negative for visual disturbance.  Respiratory: Negative for cough, shortness of breath and wheezing.   Cardiovascular: Negative for chest pain and leg swelling.  Gastrointestinal: Positive for abdominal pain and  nausea. Negative for diarrhea and vomiting.  Genitourinary: Negative for dysuria and flank pain.  Musculoskeletal: Negative for neck pain and neck stiffness.  Skin: Negative for rash and wound.  Allergic/Immunologic: Negative for immunocompromised state.  Neurological: Negative for syncope, weakness and headaches.  All other systems reviewed and are negative.    Physical Exam Updated Vital Signs BP 102/74   Pulse 90   Temp (!) 100.9 F (38.3 C) (Oral)   Resp (!) 21   Ht 5\' 10"  (1.778 m)   Wt  95.9 kg   SpO2 97%   BMI 30.33 kg/m   Physical Exam  Constitutional: He is oriented to person, place, and time. He appears well-developed and well-nourished. No distress.  HENT:  Head: Normocephalic and atraumatic.  Eyes: Conjunctivae are normal.  Neck: Neck supple.  Cardiovascular: Normal rate, regular rhythm and normal heart sounds. Exam reveals no friction rub.  No murmur heard. Pulmonary/Chest: Effort normal and breath sounds normal. No respiratory distress. He has no wheezes. He has no rales.  Abdominal: He exhibits no distension. There is tenderness in the right lower quadrant and periumbilical area. There is rebound, guarding and tenderness at McBurney's point. There is no rigidity.  Musculoskeletal: He exhibits no edema.  Neurological: He is alert and oriented to person, place, and time. He exhibits normal muscle tone.  Skin: Skin is warm. Capillary refill takes less than 2 seconds.  Psychiatric: He has a normal mood and affect.  Nursing note and vitals reviewed.    ED Treatments / Results  Labs (all labs ordered are listed, but only abnormal results are displayed) Labs Reviewed  CBC - Abnormal; Notable for the following components:      Result Value   WBC 11.3 (*)    All other components within normal limits  URINALYSIS, ROUTINE W REFLEX MICROSCOPIC - Abnormal; Notable for the following components:   Specific Gravity, Urine >1.030 (*)    Hgb urine dipstick SMALL (*)    Ketones, ur 15 (*)    All other components within normal limits  URINALYSIS, MICROSCOPIC (REFLEX) - Abnormal; Notable for the following components:   Bacteria, UA RARE (*)    All other components within normal limits  LIPASE, BLOOD  COMPREHENSIVE METABOLIC PANEL    EKG None  Radiology Ct Abdomen Pelvis W Contrast  Result Date: 11/12/2017 CLINICAL DATA:  Right upper quadrant and epigastric pain today. Nausea and vomiting. Hematuria. EXAM: CT ABDOMEN AND PELVIS WITH CONTRAST TECHNIQUE:  Multidetector CT imaging of the abdomen and pelvis was performed using the standard protocol following bolus administration of intravenous contrast. CONTRAST:  ISOVUE-300 IOPAMIDOL (ISOVUE-300) INJECTION 61% COMPARISON:  None. FINDINGS: Lower chest: Dependent atelectasis in the lung bases. Coronary artery calcifications. Possible coronary stents. Hepatobiliary: No focal liver abnormality is seen. No gallstones, gallbladder wall thickening, or biliary dilatation. Pancreas: Unremarkable. No pancreatic ductal dilatation or surrounding inflammatory changes. Spleen: Normal in size without focal abnormality. Adrenals/Urinary Tract: Adrenal glands are unremarkable. Kidneys are normal, without renal calculi, focal lesion, or hydronephrosis. Bladder is unremarkable. Stomach/Bowel: Distended appendix with diameter measuring 12 mm. Periappendiceal stranding and periappendiceal fluid. Small amount of free fluid in the pelvis. Changes consistent with acute appendicitis. Appendix: Location: Medial and inferior to the cecum, extending along the psoas margin to the right pelvis Diameter: 12 mm Appendicolith: No Mucosal hyper-enhancement: Mild Extraluminal gas: No Periappendiceal collection: No discrete collection. Periappendiceal stranding and edema demonstrated. Stomach, small bowel, and colon are not abnormally distended. No wall thickening is appreciated. Sigmoid  colonic diverticulosis without evidence of diverticulitis. Vascular/Lymphatic: No significant vascular findings are present. No enlarged abdominal or pelvic lymph nodes. Reproductive: Prostate is unremarkable. Other: No free air in the abdomen. Small periumbilical hernias containing fat. Musculoskeletal: No acute or significant osseous findings. IMPRESSION: 1. Acute appendicitis. No abscess. Periappendiceal stranding and fluid. 2. Small amount of free fluid in the pelvis, likely reactive. 3. Sigmoid colonic diverticulosis without evidence of diverticulitis.  Electronically Signed   By: Burman NievesWilliam  Stevens M.D.   On: 11/12/2017 00:14    Procedures Procedures (including critical care time)  Medications Ordered in ED Medications  0.9 %  sodium chloride infusion (has no administration in time range)  morphine 4 MG/ML injection 4 mg (4 mg Intravenous Given 11/11/17 2234)  ondansetron (ZOFRAN) injection 4 mg (4 mg Intravenous Given 11/11/17 2234)  sodium chloride 0.9 % bolus 1,000 mL (1,000 mLs Intravenous New Bag/Given 11/11/17 2239)  acetaminophen (TYLENOL) tablet 1,000 mg (1,000 mg Oral Given 11/11/17 2235)  cefTRIAXone (ROCEPHIN) 2 g in sodium chloride 0.9 % 100 mL IVPB (0 g Intravenous Stopped 11/12/17 0023)    And  metroNIDAZOLE (FLAGYL) IVPB 500 mg (0 mg Intravenous Stopped 11/11/17 2342)  iopamidol (ISOVUE-300) 61 % injection 100 mL (100 mLs Intravenous Contrast Given 11/11/17 2344)     Initial Impression / Assessment and Plan / ED Course  I have reviewed the triage vital signs and the nursing notes.  Pertinent labs & imaging results that were available during my care of the patient were reviewed by me and considered in my medical decision making (see chart for details).     42 yo M here with RLQ pain. Concern for appendicitis clinically - will check CT. DDx colitis, ileitis, less likely stone or UTI, less likely cholecystitis. Labs w/ leukocytosis but are otherwise reassuring. Will make pt NPO, start IVF, analgesia. Given fever here with high concern for appendicitis, will give empiric dose of Rocephin/Flagyl. Pt NPO.  CT scan is c/w acute appendicitis. No rupture. Will consult [facility] on call, admit. IV ABX given. Pt NPO. Held dose of brilinta.  Final Clinical Impressions(s) / ED Diagnoses   Final diagnoses:  RLQ abdominal pain  Acute appendicitis, unspecified acute appendicitis type    ED Discharge Orders    None       Shaune PollackIsaacs, Marcellius Montagna, MD 11/12/17 [phone]

## 2017-11-11 NOTE — ED Triage Notes (Signed)
RLQ pain that started today. Constant pain. Vomiting x 1. denies fever. Referred from ucc to r/o appy

## 2017-11-12 ENCOUNTER — Other Ambulatory Visit: Payer: Self-pay

## 2017-11-12 DIAGNOSIS — Z01810 Encounter for preprocedural cardiovascular examination: Secondary | ICD-10-CM

## 2017-11-12 DIAGNOSIS — I472 Ventricular tachycardia: Secondary | ICD-10-CM

## 2017-11-12 DIAGNOSIS — I130 Hypertensive heart and chronic kidney disease with heart failure and stage 1 through stage 4 chronic kidney disease, or unspecified chronic kidney disease: Secondary | ICD-10-CM | POA: Diagnosis present

## 2017-11-12 DIAGNOSIS — I255 Ischemic cardiomyopathy: Secondary | ICD-10-CM | POA: Diagnosis present

## 2017-11-12 DIAGNOSIS — Z955 Presence of coronary angioplasty implant and graft: Secondary | ICD-10-CM | POA: Diagnosis not present

## 2017-11-12 DIAGNOSIS — K3580 Unspecified acute appendicitis: Secondary | ICD-10-CM | POA: Diagnosis present

## 2017-11-12 DIAGNOSIS — N182 Chronic kidney disease, stage 2 (mild): Secondary | ICD-10-CM | POA: Diagnosis present

## 2017-11-12 DIAGNOSIS — Z8674 Personal history of sudden cardiac arrest: Secondary | ICD-10-CM | POA: Diagnosis not present

## 2017-11-12 DIAGNOSIS — K3532 Acute appendicitis with perforation and localized peritonitis, without abscess: Secondary | ICD-10-CM | POA: Diagnosis not present

## 2017-11-12 DIAGNOSIS — I493 Ventricular premature depolarization: Secondary | ICD-10-CM | POA: Diagnosis present

## 2017-11-12 DIAGNOSIS — I252 Old myocardial infarction: Secondary | ICD-10-CM | POA: Diagnosis not present

## 2017-11-12 DIAGNOSIS — Z7982 Long term (current) use of aspirin: Secondary | ICD-10-CM | POA: Diagnosis not present

## 2017-11-12 DIAGNOSIS — I2111 ST elevation (STEMI) myocardial infarction involving right coronary artery: Secondary | ICD-10-CM | POA: Diagnosis not present

## 2017-11-12 DIAGNOSIS — Z7901 Long term (current) use of anticoagulants: Secondary | ICD-10-CM | POA: Diagnosis not present

## 2017-11-12 DIAGNOSIS — I5022 Chronic systolic (congestive) heart failure: Secondary | ICD-10-CM | POA: Diagnosis present

## 2017-11-12 DIAGNOSIS — K5730 Diverticulosis of large intestine without perforation or abscess without bleeding: Secondary | ICD-10-CM | POA: Diagnosis present

## 2017-11-12 DIAGNOSIS — Z79899 Other long term (current) drug therapy: Secondary | ICD-10-CM | POA: Diagnosis not present

## 2017-11-12 DIAGNOSIS — I2510 Atherosclerotic heart disease of native coronary artery without angina pectoris: Secondary | ICD-10-CM | POA: Diagnosis present

## 2017-11-12 DIAGNOSIS — Z9861 Coronary angioplasty status: Secondary | ICD-10-CM | POA: Diagnosis not present

## 2017-11-12 DIAGNOSIS — R1031 Right lower quadrant pain: Secondary | ICD-10-CM | POA: Diagnosis present

## 2017-11-12 LAB — BASIC METABOLIC PANEL
Anion gap: 9 (ref 5–15)
BUN: 12 mg/dL (ref 6–20)
CO2: 23 mmol/L (ref 22–32)
Calcium: 8.4 mg/dL — ABNORMAL LOW (ref 8.9–10.3)
Chloride: 108 mmol/L (ref 98–111)
Creatinine, Ser: 1.11 mg/dL (ref 0.61–1.24)
GFR calc Af Amer: >60 mL/min (ref >60)
Glucose, Bld: 98 mg/dL (ref 70–99)
POTASSIUM: 3.8 mmol/L (ref 3.5–5.1)
Sodium: 140 mmol/L (ref 135–145)

## 2017-11-12 LAB — CBC
HEMATOCRIT: 38.3 % — AB (ref 39.0–52.0)
Hemoglobin: 12.7 g/dL — ABNORMAL LOW (ref 13.0–17.0)
MCH: 30 pg (ref 26.0–34.0)
MCHC: 33.2 g/dL (ref 30.0–36.0)
MCV: 90.5 fL (ref 78.0–100.0)
Platelets: 249 10*3/uL (ref 150–400)
RBC: 4.23 MIL/uL (ref 4.22–5.81)
RDW: 14.7 % (ref 11.5–15.5)
WBC: 10.3 10*3/uL (ref 4.0–10.5)

## 2017-11-12 LAB — PROTIME-INR
INR: 1.18
PROTHROMBIN TIME: 14.9 s (ref 11.4–15.2)

## 2017-11-12 LAB — APTT: aPTT: 33 seconds (ref 24–36)

## 2017-11-12 LAB — HEPARIN LEVEL (UNFRACTIONATED): HEPARIN UNFRACTIONATED: 0.4 [IU]/mL (ref 0.30–0.70)

## 2017-11-12 MED ORDER — SODIUM CHLORIDE 0.9 % IV SOLN
Freq: Once | INTRAVENOUS | Status: AC
  Administered 2017-11-12: 02:00:00 via INTRAVENOUS

## 2017-11-12 MED ORDER — HYDROMORPHONE HCL 1 MG/ML IJ SOLN
0.5000 mg | INTRAMUSCULAR | Status: AC | PRN
  Administered 2017-11-12: 1 mg via INTRAVENOUS
  Filled 2017-11-12: qty 1

## 2017-11-12 MED ORDER — HYDROMORPHONE HCL 1 MG/ML IJ SOLN
0.5000 mg | INTRAMUSCULAR | Status: DC | PRN
  Administered 2017-11-12 – 2017-11-14 (×2): 0.5 mg via INTRAVENOUS
  Filled 2017-11-12: qty 1
  Filled 2017-11-12: qty 0.5

## 2017-11-12 MED ORDER — CARVEDILOL 6.25 MG PO TABS
9.3750 mg | ORAL_TABLET | Freq: Two times a day (BID) | ORAL | Status: DC
  Administered 2017-11-12: 9.375 mg via ORAL
  Administered 2017-11-14 – 2017-11-15 (×3): 6.25 mg via ORAL
  Filled 2017-11-12 (×5): qty 1

## 2017-11-12 MED ORDER — SPIRONOLACTONE 25 MG PO TABS
25.0000 mg | ORAL_TABLET | Freq: Every day | ORAL | Status: DC
  Administered 2017-11-12 – 2017-11-14 (×3): 25 mg via ORAL
  Filled 2017-11-12 (×3): qty 1

## 2017-11-12 MED ORDER — ONDANSETRON HCL 4 MG/2ML IJ SOLN: 4.0000 mg | Freq: Three times a day (TID) | INTRAMUSCULAR | Status: AC | PRN

## 2017-11-12 MED ORDER — DIPHENHYDRAMINE HCL 50 MG/ML IJ SOLN: 12.5000 mg | Freq: Four times a day (QID) | INTRAMUSCULAR | Status: DC | PRN

## 2017-11-12 MED ORDER — ONDANSETRON 4 MG PO TBDP: 4.0000 mg | ORAL_TABLET | Freq: Four times a day (QID) | ORAL | Status: DC | PRN

## 2017-11-12 MED ORDER — HYDROMORPHONE HCL 1 MG/ML IJ SOLN: 1.0000 mg | INTRAMUSCULAR | Status: DC | PRN

## 2017-11-12 MED ORDER — FAMOTIDINE IN NACL 20-0.9 MG/50ML-% IV SOLN
20.0000 mg | Freq: Two times a day (BID) | INTRAVENOUS | Status: DC
  Administered 2017-11-12 – 2017-11-14 (×6): 20 mg via INTRAVENOUS
  Filled 2017-11-12 (×6): qty 50

## 2017-11-12 MED ORDER — ONDANSETRON HCL 4 MG/2ML IJ SOLN: 4.0000 mg | Freq: Four times a day (QID) | INTRAMUSCULAR | Status: DC | PRN

## 2017-11-12 MED ORDER — DIPHENHYDRAMINE HCL 12.5 MG/5ML PO ELIX: 12.5000 mg | ORAL_SOLUTION | Freq: Four times a day (QID) | ORAL | Status: DC | PRN

## 2017-11-12 MED ORDER — ASPIRIN EC 81 MG PO TBEC
81.0000 mg | DELAYED_RELEASE_TABLET | Freq: Every day | ORAL | Status: DC
  Administered 2017-11-12 – 2017-11-15 (×4): 81 mg via ORAL
  Filled 2017-11-12 (×4): qty 1

## 2017-11-12 MED ORDER — HEPARIN (PORCINE) IN NACL 100-0.45 UNIT/ML-% IJ SOLN
1100.0000 [IU]/h | INTRAMUSCULAR | Status: DC
  Administered 2017-11-12 – 2017-11-13 (×2): 1100 [IU]/h via INTRAVENOUS
  Filled 2017-11-12 (×2): qty 250

## 2017-11-12 MED ORDER — METOPROLOL TARTRATE 5 MG/5ML IV SOLN: 5.0000 mg | Freq: Four times a day (QID) | INTRAVENOUS | Status: DC | PRN

## 2017-11-12 MED ORDER — SODIUM CHLORIDE 0.9 % IV SOLN
INTRAVENOUS | Status: AC
  Administered 2017-11-12: 02:00:00 via INTRAVENOUS

## 2017-11-12 MED ORDER — PIPERACILLIN-TAZOBACTAM 3.375 G IVPB
3.3750 g | Freq: Three times a day (TID) | INTRAVENOUS | Status: DC
  Administered 2017-11-12 – 2017-11-15 (×10): 3.375 g via INTRAVENOUS
  Filled 2017-11-12 (×10): qty 50

## 2017-11-12 MED ORDER — KCL IN DEXTROSE-NACL 20-5-0.45 MEQ/L-%-% IV SOLN
INTRAVENOUS | Status: DC
  Administered 2017-11-12 – 2017-11-14 (×4): via INTRAVENOUS
  Filled 2017-11-12 (×6): qty 1000

## 2017-11-12 MED ORDER — ACETAMINOPHEN 650 MG RE SUPP: 650.0000 mg | Freq: Four times a day (QID) | RECTAL | Status: DC | PRN

## 2017-11-12 MED ORDER — METHOCARBAMOL 500 MG PO TABS: 500.0000 mg | ORAL_TABLET | Freq: Four times a day (QID) | ORAL | Status: DC | PRN

## 2017-11-12 MED ORDER — HEPARIN BOLUS VIA INFUSION
4000.0000 [IU] | Freq: Once | INTRAVENOUS | Status: AC
  Administered 2017-11-12: 4000 [IU] via INTRAVENOUS
  Filled 2017-11-12: qty 4000

## 2017-11-12 MED ORDER — ATORVASTATIN CALCIUM 80 MG PO TABS
80.0000 mg | ORAL_TABLET | Freq: Every day | ORAL | Status: DC
  Administered 2017-11-12 – 2017-11-14 (×3): 80 mg via ORAL
  Filled 2017-11-12: qty 2
  Filled 2017-11-12 (×2): qty 1

## 2017-11-12 MED ORDER — SACUBITRIL-VALSARTAN 24-26 MG PO TABS
1.0000 | ORAL_TABLET | Freq: Two times a day (BID) | ORAL | Status: DC
  Administered 2017-11-12 – 2017-11-15 (×6): 1 via ORAL
  Filled 2017-11-12 (×7): qty 1

## 2017-11-12 MED ORDER — ACETAMINOPHEN 325 MG PO TABS: 650.0000 mg | ORAL_TABLET | Freq: Four times a day (QID) | ORAL | Status: DC | PRN

## 2017-11-12 NOTE — ED Notes (Signed)
No changes. Remains alert, NAD, calm, interactive, resps e/u, speaking in clear complete sentences, no dyspnea noted, skin W&D, EDP present at d/c. Out with [facility] to [facility].

## 2017-11-12 NOTE — Progress Notes (Addendum)
ANTICOAGULATION CONSULT NOTE - Initial Consult  Pharmacy Consult for Heparin Indication: chest pain/ACS  No Known Allergies  Patient Measurements: Height: 5\' 9"  (175.3 cm) Weight: 212 lb 11.2 oz (96.5 kg) IBW/kg (Calculated) : 70.7 Heparin Dosing Weight: 91 kg  Vital Signs: Temp: 98.9 F (37.2 C) (08/17 0839) Temp Source: Oral (08/17 0839) BP: 97/63 (08/17 0839) Pulse Rate: 35 (08/17 0839)  Labs: Recent Labs    11/11/17 2124 11/12/17 0508  HGB 14.9 12.7*  HCT 44.5 38.3*  PLT 245 249  CREATININE 1.21 1.11    Estimated Creatinine Clearance: 99.3 mL/min (by C-G formula based on SCr of 1.11 mg/dL).   Medical History: Past Medical History:  Diagnosis Date  . MI (myocardial infarction) (HCC)     Medications:  PTA meds include: Brilinta 90mg  BID (last dose 8/16)                                 ASA 81mg  QD (last dose 8/16)  Assessment:  [age] yr male admitted with abdominal pain; surgery consulted for acute appendicitis.  PMH significant for MI s/p stent placement in May 2019.  Pharmacy consulted to dose IV heparin for ACS/STEMI  Goal of Therapy:  Heparin level 0.3-0.7 units/ml Monitor platelets by anticoagulation protocol: Yes   Plan:   Obtain baseline aPTT and PT/INR  Heparin 4000 unit IV bolus x 1 followed by heparin infusion @ 1100 units/hr  Check heparin level 6 hr after heparin started  Follow heparin level and CBC daily while on heparin gtt  Oluwateniola Leitch, Joselyn GlassmanLeann Trefz, PharmD 11/12/2017,10:27 AM   ADDENDUM:  Heparin gtt infusing @ 1100 units/hr for anticoagulation needs while Brilinta is being held prior to anticipated surgery for appendicitis.  Initial Heparin level @ 18:10 = 0.4 (Goal 0.3-0.7) No complications of therapy noted  PLAN: - Continue IV heparin @ 1100 units/hr - Repeat heparin level in 6 hr to confirm therapeutic rate  Terrilee FilesLeann Haru Anspaugh, PharmD 11/12/17 @ 19:24

## 2017-11-12 NOTE — ED Notes (Signed)
Back from CT, no changes, alert, NAD, calm, interactive, resps e/u, no dyspnea noted, denies pain or nausea. VSS.

## 2017-11-12 NOTE — H&P (Signed)
Lee Moore is an 42 y.o. male.   Chief Complaint: abdominal pain  HPI: 42 yo male with 1 day of abdominal pain. Pain came on suddenly. It is sharp and located in the right lower quadrant. He vomited 2 times overnight. The pain is slightly improved this morning. He has never had a similar pain to this. He had an MI in 07/2017 with stents placed and is on ASA and brilinta with last brilinta dose being 8/16 at 9am.  Past Medical History:  Diagnosis Date  . MI (myocardial infarction) [facility])     Past Surgical History:  Procedure Laterality Date  . CORONARY/GRAFT ACUTE MI REVASCULARIZATION N/A 07/30/2017   Procedure: Coronary/Graft Acute MI Revascularization;  Surgeon: Charolette Forward, MD;  Location: [facility];  Service: Cardiovascular;  Laterality: N/A;  . LEFT HEART CATH AND CORONARY ANGIOGRAPHY N/A 07/30/2017   Procedure: LEFT HEART CATH AND CORONARY ANGIOGRAPHY;  Surgeon: Charolette Forward, MD;  Location: [facility];  Service: Cardiovascular;  Laterality: N/A;    History reviewed. No pertinent family history. Social History:  reports that he has never smoked. He has never used smokeless tobacco. He reports that he drank alcohol. He reports that he does not use drugs.  Allergies: No Known Allergies  Medications Prior to Admission  Medication Sig Dispense Refill  . aspirin EC 81 MG tablet Take 81 mg by mouth daily.    Marland Kitchen atorvastatin (LIPITOR) 80 MG tablet Take 1 tablet (80 mg total) by mouth daily at 6 PM. 30 tablet 6  . BRILINTA 90 MG TABS tablet Take 1 tablet (90 mg total) by mouth 2 (two) times daily. 60 tablet 6  . carvedilol (COREG) 6.25 MG tablet Take 1.5 tablets (9.375 mg total) by mouth 2 (two) times daily with a meal. 90 tablet 3  . sacubitril-valsartan (ENTRESTO) 24-26 MG Take 1 tablet by mouth 2 (two) times daily. 60 tablet 6  . spironolactone (ALDACTONE) 25 MG tablet Take 1 tablet (25 mg total) by mouth at bedtime. 30 tablet 3  . traMADol (ULTRAM) 50 MG tablet Take 1  tablet (50 mg total) by mouth at bedtime as needed for moderate pain. (Patient not taking: Reported on 09/15/2017) 30 tablet 0    Results for orders placed or performed during the hospital encounter of 11/11/17 (from the past 48 hour(s))  Lipase, blood     Status: None   Collection Time: 11/11/17  9:24 PM  Result Value Ref Range   Lipase 23 11 - 51 U/L    Comment: Performed at [facility], [address] [place], [place] [address]  Comprehensive metabolic panel     Status: None   Collection Time: 11/11/17  9:24 PM  Result Value Ref Range   Sodium 140 135 - 145 mmol/L   Potassium 4.1 3.5 - 5.1 mmol/L   Chloride 104 98 - 111 mmol/L   CO2 26 22 - 32 mmol/L   Glucose, Bld 97 70 - 99 mg/dL   BUN 13 6 - 20 mg/dL   Creatinine, Ser 1.21 0.61 - 1.24 mg/dL   Calcium 9.1 8.9 - 10.3 mg/dL   Total Protein 7.9 6.5 - 8.1 g/dL   Albumin 4.7 3.5 - 5.0 g/dL   AST 23 15 - 41 U/L   ALT 26 0 - 44 U/L   Alkaline Phosphatase 64 38 - 126 U/L   Total Bilirubin 0.8 0.3 - 1.2 mg/dL   GFR calc non Af Amer >60 >60 mL/min  GFR calc Af Amer >60 >60 mL/min    Comment: (NOTE) The eGFR has been calculated using the CKD EPI equation. This calculation has not been validated in all clinical situations. eGFR's persistently <60 mL/min signify possible Chronic Kidney Disease.    Anion gap 10 5 - 15    Comment: Performed at [facility], [address] [place], [place] [address]  CBC     Status: Abnormal   Collection Time: 11/11/17  9:24 PM  Result Value Ref Range   WBC 11.3 (H) 4.0 - 10.5 K/uL   RBC 4.97 4.22 - 5.81 MIL/uL   Hemoglobin 14.9 13.0 - 17.0 g/dL   HCT 44.5 39.0 - 52.0 %   MCV 89.5 78.0 - 100.0 fL   MCH 30.0 26.0 - 34.0 pg   MCHC 33.5 30.0 - 36.0 g/dL   RDW 14.2 11.5 - 15.5 %   Platelets 245 150 - 400 K/uL    Comment: Performed at [facility], [address] [place], [place] [address]  Urinalysis, Routine w reflex microscopic     Status: Abnormal    Collection Time: 11/11/17  9:24 PM  Result Value Ref Range   Color, Urine YELLOW YELLOW   APPearance CLEAR CLEAR   Specific Gravity, Urine >1.030 (H) 1.005 - 1.030   pH 5.5 5.0 - 8.0   Glucose, UA NEGATIVE NEGATIVE mg/dL   Hgb urine dipstick SMALL (A) NEGATIVE   Bilirubin Urine NEGATIVE NEGATIVE   Ketones, ur 15 (A) NEGATIVE mg/dL   Protein, ur NEGATIVE NEGATIVE mg/dL   Nitrite NEGATIVE NEGATIVE   Leukocytes, UA NEGATIVE NEGATIVE    Comment: Performed at [facility], [address] [place], [place] [address]  Urinalysis, Microscopic (reflex)     Status: Abnormal   Collection Time: 11/11/17  9:24 PM  Result Value Ref Range   RBC / HPF 0-5 0 - 5 RBC/hpf   WBC, UA NONE SEEN 0 - 5 WBC/hpf   Bacteria, UA RARE (A) NONE SEEN   Squamous Epithelial / LPF 0-5 0 - 5   Mucus PRESENT     Comment: Performed at [facility], [address] [place], [place] [address]  Basic metabolic panel     Status: Abnormal   Collection Time: 11/12/17  5:08 AM  Result Value Ref Range   Sodium 140 135 - 145 mmol/L   Potassium 3.8 3.5 - 5.1 mmol/L   Chloride 108 98 - 111 mmol/L   CO2 23 22 - 32 mmol/L   Glucose, Bld 98 70 - 99 mg/dL   BUN 12 6 - 20 mg/dL   Creatinine, Ser 1.11 0.61 - 1.24 mg/dL   Calcium 8.4 (L) 8.9 - 10.3 mg/dL   GFR calc non Af Amer >60 >60 mL/min   GFR calc Af Amer >60 >60 mL/min    Comment: (NOTE) The eGFR has been calculated using the CKD EPI equation. This calculation has not been validated in all clinical situations. eGFR's persistently <60 mL/min signify possible Chronic Kidney Disease.    Anion gap 9 5 - 15    Comment: Performed at [facility], [place] [address], [place], [place] [address]  CBC     Status: Abnormal   Collection Time: 11/12/17  5:08 AM  Result Value Ref Range   WBC 10.3 4.0 - 10.5 K/uL   RBC 4.23 4.22 - 5.81 MIL/uL   Hemoglobin 12.7 (L) 13.0 - 17.0 g/dL  HCT 38.3 (L) 39.0 - 52.0 %   MCV 90.5 78.0 - 100.0 fL   MCH  30.0 26.0 - 34.0 pg   MCHC 33.2 30.0 - 36.0 g/dL   RDW 14.7 11.5 - 15.5 %   Platelets 249 150 - 400 K/uL    Comment: Performed at [facility], [place] [address], [place], [place] [address]   Ct Abdomen Pelvis W Contrast  Result Date: 11/12/2017 CLINICAL DATA:  Right upper quadrant and epigastric pain today. Nausea and vomiting. Hematuria. EXAM: CT ABDOMEN AND PELVIS WITH CONTRAST TECHNIQUE: Multidetector CT imaging of the abdomen and pelvis was performed using the standard protocol following bolus administration of intravenous contrast. CONTRAST:  171m ISOVUE-300 IOPAMIDOL (ISOVUE-300) INJECTION 61% COMPARISON:  None. FINDINGS: Lower chest: Dependent atelectasis in the lung bases. Coronary artery calcifications. Possible coronary stents. Hepatobiliary: No focal liver abnormality is seen. No gallstones, gallbladder wall thickening, or biliary dilatation. Pancreas: Unremarkable. No pancreatic ductal dilatation or surrounding inflammatory changes. Spleen: Normal in size without focal abnormality. Adrenals/Urinary Tract: Adrenal glands are unremarkable. Kidneys are normal, without renal calculi, focal lesion, or hydronephrosis. Bladder is unremarkable. Stomach/Bowel: Distended appendix with diameter measuring 12 mm. Periappendiceal stranding and periappendiceal fluid. Small amount of free fluid in the pelvis. Changes consistent with acute appendicitis. Appendix: Location: Medial and inferior to the cecum, extending along the psoas margin to the right pelvis Diameter: 12 mm Appendicolith: No Mucosal hyper-enhancement: Mild Extraluminal gas: No Periappendiceal collection: No discrete collection. Periappendiceal stranding and edema demonstrated. Stomach, small bowel, and colon are not abnormally distended. No wall thickening is appreciated. Sigmoid colonic diverticulosis without evidence of diverticulitis. Vascular/Lymphatic: No significant vascular findings are present. No enlarged abdominal or  pelvic lymph nodes. Reproductive: Prostate is unremarkable. Other: No free air in the abdomen. Small periumbilical hernias containing fat. Musculoskeletal: No acute or significant osseous findings. IMPRESSION: 1. Acute appendicitis. No abscess. Periappendiceal stranding and fluid. 2. Small amount of free fluid in the pelvis, likely reactive. 3. Sigmoid colonic diverticulosis without evidence of diverticulitis. Electronically Signed   By: WLucienne CapersM.D.   On: 11/12/2017 00:14    Review of Systems  Constitutional: Negative for chills and fever.  HENT: Negative for hearing loss.   Eyes: Negative for blurred vision and double vision.  Respiratory: Negative for cough and hemoptysis.   Cardiovascular: Negative for chest pain and palpitations.  Gastrointestinal: Positive for abdominal pain, nausea and vomiting.  Genitourinary: Negative for dysuria and urgency.  Musculoskeletal: Negative for myalgias and neck pain.  Skin: Negative for itching and rash.  Neurological: Negative for dizziness, tingling and headaches.  Endo/Heme/Allergies: Does not bruise/bleed easily.  Psychiatric/Behavioral: Negative for depression and suicidal ideas.    Blood pressure 117/73, pulse 63, temperature 98.2 F (36.8 C), temperature source Oral, resp. rate 18, height 5' 9"  (1.753 m), weight 96.5 kg, SpO2 100 %. Physical Exam  Vitals reviewed. Constitutional: He is oriented to person, place, and time. He appears well-developed and well-nourished.  HENT:  Head: Normocephalic and atraumatic.  Eyes: Pupils are equal, round, and reactive to light. Conjunctivae and EOM are normal.  Neck: Normal range of motion. Neck supple.  Cardiovascular: Normal rate and regular rhythm.  Respiratory: Effort normal and breath sounds normal.  GI: Soft. Bowel sounds are normal. He exhibits no distension. There is tenderness in the right lower quadrant. There is no rigidity, no rebound and no guarding.  Musculoskeletal: Normal range of  motion.  Neurological: He is alert and oriented to person, place, and time.  Skin: Skin is warm and dry.  Psychiatric: He has a normal mood and affect. His behavior is normal.    Assessment/Plan 42 yo male with significant heart history with recent stent placement presents with signs of early appendicitis, WBC improved this morning, last brilinta dose 8/16. -consult cardiology for antiplatelet recs and cardiac stratification -continue abx -continue bowel rest  Mickeal Skinner, MD 11/12/2017, 7:52 AM

## 2017-11-12 NOTE — ED Notes (Signed)
Attempted report 

## 2017-11-12 NOTE — Consult Note (Signed)
Cardiology Consultation:   Patient ID: Lee Moore; [id number]; [birth year]   Admit date: 11/11/2017 Date of Consult: 11/12/2017  Primary Care Provider: Patient, No Pcp Per Primary Cardiologist: Dr Shirlee Latch Primary Electrophysiologist:  Dr Ladona Ridgel   Patient Profile:   Lee Moore is a 42 y.o. male with a hx of CAD who is being seen today for the preop evaluation of an acute appendicitis at the request of [name].  History of Present Illness:   [name] 42 yo male with history of CAD s/p MI in 2017 and again in 5/19 presents for followup of CHF and CAD.  Patient had initial MI in [place] in 2017, he reports a total of 5 stents to the RCA system. He recently moved to [place] with work.  In 5/19, he had inferior STEMI complicated by multiple episodes of pulseless VT.  He had CPR and ultimately when to the cath lab.  This showed occluded proximal RCA but minimal disease on the left.  The proximal to mid RCA were stented, restoring flow to an acute marginal.  However, the distal RCA, PLV and PDA remained occluded. There were collaterals to the PLV from the left suggesting pre-existing disease.  He underwent hypothermia protocol and had full neurologic recovery from arrest.  Echo in 5/19 showed EF 30-35%.  Cardiac medications were titrated up and he was discharged with a Lifevest.  Repeat echo in 7/19 showed EF improved to 50-55%.  He follow with Dr Shirlee Latch, last seen on 10/28/2017 when he was doing well.    He was admitted with abdominal pain and diagnosed with an acute appendicitis. He is NPO, ov iv fluids and ATB with improving abdominal pain and decreasing WBC. Dr Sheliah Hatch consulted [place] for the preop evaluation and management of DAPT.   Past Medical History:  Diagnosis Date  . MI (myocardial infarction) [facility])     Past Surgical History:  Procedure Laterality Date  . CORONARY/GRAFT ACUTE MI REVASCULARIZATION N/A 07/30/2017   Procedure: Coronary/Graft Acute MI Revascularization;   Surgeon: Rinaldo Cloud, MD;  Location: [facility];  Service: Cardiovascular;  Laterality: N/A;  . LEFT HEART CATH AND CORONARY ANGIOGRAPHY N/A 07/30/2017   Procedure: LEFT HEART CATH AND CORONARY ANGIOGRAPHY;  Surgeon: Rinaldo Cloud, MD;  Location: [facility];  Service: Cardiovascular;  Laterality: N/A;    Inpatient Medications: Scheduled Meds: . atorvastatin  80 mg Oral q1800  . carvedilol  9.375 mg Oral BID WC  . sacubitril-valsartan  1 tablet Oral BID  . spironolactone  25 mg Oral QHS   Continuous Infusions: . dextrose 5 % and 0.45 % NaCl with KCl 20 mEq/L 75 mL/hr at 11/12/17 0600  . famotidine (PEPCID) IV    . piperacillin-tazobactam (ZOSYN)  IV 12.5 mL/hr at 11/12/17 0600   PRN Meds: acetaminophen **OR** acetaminophen, diphenhydrAMINE **OR** diphenhydrAMINE, HYDROmorphone (DILAUDID) injection, methocarbamol, metoprolol tartrate, ondansetron (ZOFRAN) IV, ondansetron **OR** ondansetron (ZOFRAN) IV  Allergies:   No Known Allergies  Social History:   Social History   Socioeconomic History  . Marital status: Married    Spouse name: Not on file  . Number of children: Not on file  . Years of education: Not on file  . Highest education level: Not on file  Occupational History  . Not on file  Social Needs  . Financial resource strain: Not on file  . Food insecurity:    Worry: Not on file    Inability: Not on file  . Transportation needs:    Medical:  Not on file    Non-medical: Not on file  Tobacco Use  . Smoking status: Never Smoker  . Smokeless tobacco: Never Used  Substance and Sexual Activity  . Alcohol use: Not Currently    Comment: unknown  . Drug use: Never  . Sexual activity: Yes  Lifestyle  . Physical activity:    Days per week: Not on file    Minutes per session: Not on file  . Stress: Not on file  Relationships  . Social connections:    Talks on phone: Not on file    Gets together: Not on file    Attends religious service: Not on file     Active member of club or organization: Not on file    Attends meetings of clubs or organizations: Not on file    Relationship status: Not on file  . Intimate partner violence:    Fear of current or ex partner: Not on file    Emotionally abused: Not on file    Physically abused: Not on file    Forced sexual activity: Not on file  Other Topics Concern  . Not on file  Social History Narrative   Senior VP at [employer]    Family History:   No FH of CAD or SCD.  ROS:  Please see the history of present illness.  All other ROS reviewed and negative.     Physical Exam/Data:   Vitals:   11/12/17 0330 11/12/17 0421 11/12/17 0437 11/12/17 0839  BP: 112/77 117/73  97/63  Pulse:  63  (!) 35  Resp: 16 18  20   Temp:  98.2 F (36.8 C)  98.9 F (37.2 C)  TempSrc:  Oral  Oral  SpO2: 97% 100%  98%  Weight:   96.5 kg   Height:   5\' 9"  (1.753 m)     Intake/Output Summary (Last 24 hours) at 11/12/2017 0957 Last data filed at 11/12/2017 0600 Gross per 24 hour  Intake 1255.9 ml  Output -  Net 1255.9 ml   Filed Weights   11/11/17 2116 11/12/17 0437  Weight: 95.9 kg 96.5 kg   Body mass index is 31.41 kg/m.   General:  Well nourished, well developed, in no acute distress HEENT: normal Lymph: no adenopathy Neck: no JVD Endocrine:  No thryomegaly Vascular: No carotid bruits; FA pulses 2+ bilaterally without bruits  Cardiac:  normal S1, S2; RRR; no murmur  Lungs:  clear to auscultation bilaterally, no wheezing, rhonchi or rales  Abd: soft, nontender, no hepatomegaly  Ext: no edema Musculoskeletal:  No deformities, BUE and BLE strength normal and equal Skin: warm and dry  Neuro:  CNs 2-12 intact, no focal abnormalities noted Psych:  Normal affect   EKG:  The EKG was personally reviewed and demonstrates:  SR, PVCs, anterior infarct, age undetermined, inferolateral ischemia unchanged from prior Telemetry:  Telemetry was personally reviewed and demonstrates:  DR, PVCs  Relevant CV  Studies:  TTE; 10/27/2017  - Left ventricle: The cavity size was normal. Wall thickness was   normal. Systolic function was normal. The estimated ejection   fraction was in the range of 50% to 55%. Mild basal inferior   hypokinesis. Doppler parameters are consistent with abnormal left   ventricular relaxation (grade 1 diastolic dysfunction). The E/e&'   ratio is <8, suggesting normal LV filling pressure. - Mitral valve: Mildly thickened leaflets . There was trivial   regurgitation. - Left atrium: The atrium was normal in size. - Right atrium: The  atrium was mildly dilated. - Atrial septum: Aneurysmal. Cannot exclude PFO. - Tricuspid valve: There was trivial regurgitation. - Pulmonary arteries: PA peak pressure: 26 mm Hg (S). - Inferior vena cava: The vessel was normal in size. The   respirophasic diameter changes were in the normal range (= 50%),   consistent with normal central venous pressure.  Impressions:  - LVEF 50-55%, normal wall thickness, normal wall motion, grade 1   DD, normal LV filling pressure, trivial MR, mild RAE, aneursymal   IAS- cannot exclude PFO, trivial TR, RVSP 26 mmHg, normal IVC.   Compared to a prior study in 07/2017, the LVEF has improved.  Laboratory Data:  Chemistry Recent Labs  Lab 11/11/17 2124 11/12/17 0508  NA 140 140  K 4.1 3.8  CL 104 108  CO2 26 23  GLUCOSE 97 98  BUN 13 12  CREATININE 1.21 1.11  CALCIUM 9.1 8.4*  GFRNONAA >60 >60  GFRAA >60 >60  ANIONGAP 10 9    Recent Labs  Lab 11/11/17 2124  PROT 7.9  ALBUMIN 4.7  AST 23  ALT 26  ALKPHOS 64  BILITOT 0.8   Hematology Recent Labs  Lab 11/11/17 2124 11/12/17 0508  WBC 11.3* 10.3  RBC 4.97 4.23  HGB 14.9 12.7*  HCT 44.5 38.3*  MCV 89.5 90.5  MCH 30.0 30.0  MCHC 33.5 33.2  RDW 14.2 14.7  PLT 245 249   Cardiac EnzymesNo results for input(s): TROPONINI in the last 168 hours. No results for input(s): TROPIPOC in the last 168 hours.  BNPNo results for input(s): BNP,  PROBNP in the last 168 hours.  DDimer No results for input(s): DDIMER in the last 168 hours.  Radiology/Studies:  Ct Abdomen Pelvis W Contrast  Result Date: 11/12/2017 CLINICAL DATA:  Right upper quadrant and epigastric pain today. Nausea and vomiting. Hematuria. EXAM: CT ABDOMEN AND PELVIS WITH CONTRAST TECHNIQUE: Multidetector CT imaging of the abdomen and pelvis was performed using the standard protocol following bolus administration of intravenous contrast. CONTRAST:  100mL ISOVUE-300 IOPAMIDOL (ISOVUE-300) INJECTION 61% COMPARISON:  None. FINDINGS: Lower chest: Dependent atelectasis in the lung bases. Coronary artery calcifications. Possible coronary stents. Hepatobiliary: No focal liver abnormality is seen. No gallstones, gallbladder wall thickening, or biliary dilatation. Pancreas: Unremarkable. No pancreatic ductal dilatation or surrounding inflammatory changes. Spleen: Normal in size without focal abnormality. Adrenals/Urinary Tract: Adrenal glands are unremarkable. Kidneys are normal, without renal calculi, focal lesion, or hydronephrosis. Bladder is unremarkable. Stomach/Bowel: Distended appendix with diameter measuring 12 mm. Periappendiceal stranding and periappendiceal fluid. Small amount of free fluid in the pelvis. Changes consistent with acute appendicitis. Appendix: Location: Medial and inferior to the cecum, extending along the psoas margin to the right pelvis Diameter: 12 mm Appendicolith: No Mucosal hyper-enhancement: Mild Extraluminal gas: No Periappendiceal collection: No discrete collection. Periappendiceal stranding and edema demonstrated. Stomach, small bowel, and colon are not abnormally distended. No wall thickening is appreciated. Sigmoid colonic diverticulosis without evidence of diverticulitis. Vascular/Lymphatic: No significant vascular findings are present. No enlarged abdominal or pelvic lymph nodes. Reproductive: Prostate is unremarkable. Other: No free air in the abdomen.  Small periumbilical hernias containing fat. Musculoskeletal: No acute or significant osseous findings. IMPRESSION: 1. Acute appendicitis. No abscess. Periappendiceal stranding and fluid. 2. Small amount of free fluid in the pelvis, likely reactive. 3. Sigmoid colonic diverticulosis without evidence of diverticulitis. Electronically Signed   By: Burman NievesWilliam  Stevens M.D.   On: 11/12/2017 00:14    Assessment and Plan:   1. 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 Surgery held both Brilinta and aspirin since yesterday - I have discussed the case with Dr Sheliah Hatch, he agrees with starting Heparin drip and starting aspirin - ideally he would be managed conservatively, if surgery needed, I would continue aspirin, heparin drip till surgery, retart heparin drip as soon as possible after the surgery, restart Brilinta once heparin is discontinued  2. VT: Patient had pulseless VT in setting of inferior STEMI.  Frequent PVCs on the monitor.  3. Chronic systolic CHF: Ischemic cardiomyopathy.  Appears euvolemic. - Echo (5/19): EF 30-35%, inferior WMA.   - Echo (7/19): EF 50-55%  4. CKD: resolved  For questions or updates, please contact [facility] Please consult [email or web address] for contact info under Cardiology/STEMI.   Signed, Tobias Alexander, MD  11/12/2017 9:57 AM

## 2017-11-12 NOTE — ED Notes (Signed)
Paged Dr Donell BeersByerly, 0130 @ [phone].

## 2017-11-13 DIAGNOSIS — I252 Old myocardial infarction: Secondary | ICD-10-CM

## 2017-11-13 LAB — CBC
HEMATOCRIT: 36.4 % — AB (ref 39.0–52.0)
HEMOGLOBIN: 12.1 g/dL — AB (ref 13.0–17.0)
MCH: 30.4 pg (ref 26.0–34.0)
MCHC: 33.2 g/dL (ref 30.0–36.0)
MCV: 91.5 fL (ref 78.0–100.0)
Platelets: 228 10*3/uL (ref 150–400)
RBC: 3.98 MIL/uL — AB (ref 4.22–5.81)
RDW: 14.7 % (ref 11.5–15.5)
WBC: 7.8 10*3/uL (ref 4.0–10.5)

## 2017-11-13 LAB — HEPARIN LEVEL (UNFRACTIONATED)
HEPARIN UNFRACTIONATED: 0.28 [IU]/mL — AB (ref 0.30–0.70)
Heparin Unfractionated: 0.36 IU/mL (ref 0.30–0.70)
Heparin Unfractionated: 0.44 IU/mL (ref 0.30–0.70)

## 2017-11-13 MED ORDER — HEPARIN (PORCINE) IN NACL 100-0.45 UNIT/ML-% IJ SOLN
1300.0000 [IU]/h | INTRAMUSCULAR | Status: DC
  Administered 2017-11-13 – 2017-11-14 (×2): 1300 [IU]/h via INTRAVENOUS
  Filled 2017-11-13 (×2): qty 250

## 2017-11-13 NOTE — Progress Notes (Signed)
Subjective/Chief Complaint: Continues to make significant improvements.  Minimal discomfort.  No n/v.  "Starving."     Objective: Vital signs in last 24 hours: Temp:  [97.8 F (36.6 C)-99.6 F (37.6 C)] 97.8 F (36.6 C) (08/18 0505) Pulse Rate:  [33-109] 36 (08/18 0505) Resp:  [16-22] 20 (08/18 0505) BP: (96-107)/(52-63) 101/61 (08/18 0606) SpO2:  [97 %-100 %] 97 % (08/18 0057) Last BM Date: 11/11/17  Intake/Output from previous day: 08/17 0701 - 08/18 0700 In: 1251.1 [I.V.:1018.8; IV Piggyback:232.3] Out: 300 [Urine:300] Intake/Output this shift: No intake/output data recorded.  General appearance: alert, cooperative and no distress Resp: breathing comfortably GI: soft, non distended.  Minimal tenderness.   Extremities: extremities normal, atraumatic, no cyanosis or edema  Lab Results:  Recent Labs    11/12/17 0508 11/13/17 0022  WBC 10.3 7.8  HGB 12.7* 12.1*  HCT 38.3* 36.4*  PLT 249 228   BMET Recent Labs    11/11/17 2124 11/12/17 0508  NA 140 140  K 4.1 3.8  CL 104 108  CO2 26 23  GLUCOSE 97 98  BUN 13 12  CREATININE 1.21 1.11  CALCIUM 9.1 8.4*   PT/INR Recent Labs    11/12/17 1032  LABPROT 14.9  INR 1.18   ABG No results for input(s): PHART, HCO3 in the last 72 hours.  Invalid input(s): PCO2, PO2  Studies/Results: Ct Abdomen Pelvis W Contrast  Result Date: 11/12/2017 CLINICAL DATA:  Right upper quadrant and epigastric pain today. Nausea and vomiting. Hematuria. EXAM: CT ABDOMEN AND PELVIS WITH CONTRAST TECHNIQUE: Multidetector CT imaging of the abdomen and pelvis was performed using the standard protocol following bolus administration of intravenous contrast. CONTRAST:  100mL ISOVUE-300 IOPAMIDOL (ISOVUE-300) INJECTION 61% COMPARISON:  None. FINDINGS: Lower chest: Dependent atelectasis in the lung bases. Coronary artery calcifications. Possible coronary stents. Hepatobiliary: No focal liver abnormality is seen. No gallstones, gallbladder  wall thickening, or biliary dilatation. Pancreas: Unremarkable. No pancreatic ductal dilatation or surrounding inflammatory changes. Spleen: Normal in size without focal abnormality. Adrenals/Urinary Tract: Adrenal glands are unremarkable. Kidneys are normal, without renal calculi, focal lesion, or hydronephrosis. Bladder is unremarkable. Stomach/Bowel: Distended appendix with diameter measuring 12 mm. Periappendiceal stranding and periappendiceal fluid. Small amount of free fluid in the pelvis. Changes consistent with acute appendicitis. Appendix: Location: Medial and inferior to the cecum, extending along the psoas margin to the right pelvis Diameter: 12 mm Appendicolith: No Mucosal hyper-enhancement: Mild Extraluminal gas: No Periappendiceal collection: No discrete collection. Periappendiceal stranding and edema demonstrated. Stomach, small bowel, and colon are not abnormally distended. No wall thickening is appreciated. Sigmoid colonic diverticulosis without evidence of diverticulitis. Vascular/Lymphatic: No significant vascular findings are present. No enlarged abdominal or pelvic lymph nodes. Reproductive: Prostate is unremarkable. Other: No free air in the abdomen. Small periumbilical hernias containing fat. Musculoskeletal: No acute or significant osseous findings. IMPRESSION: 1. Acute appendicitis. No abscess. Periappendiceal stranding and fluid. 2. Small amount of free fluid in the pelvis, likely reactive. 3. Sigmoid colonic diverticulosis without evidence of diverticulitis. Electronically Signed   By: Burman NievesWilliam  Stevens M.D.   On: 11/12/2017 00:14    Anti-infectives: Anti-infectives (From admission, onward)   Start     Dose/Rate Route Frequency Ordered Stop   11/12/17 0430  piperacillin-tazobactam (ZOSYN) IVPB 3.375 g     3.375 g 12.5 mL/hr over 240 Minutes Intravenous Every 8 hours 11/12/17 0429     11/11/17 2230  cefTRIAXone (ROCEPHIN) 2 g in sodium chloride 0.9 % 100 mL IVPB  2 g 200 mL/hr  over 30 Minutes Intravenous  Once 11/11/17 2225 11/12/17 0023   11/11/17 2230  metroNIDAZOLE (FLAGYL) IVPB 500 mg     500 mg 100 mL/hr over 60 Minutes Intravenous  Once 11/11/17 2225 11/11/17 2342      Assessment/Plan: s/p * No surgery found * acute appendicitis  CAD, ICM, s/p v fib arrest and stenting 07/2017.  Pt with significant clinical improvement.  Improved symptoms, improved temperature curve, WBCs down to normal. Will give clears.  If worse clinically, probable appendectomy tomorrow.   If improved, restart brilinta and advance diet.      LOS: 1 day    Almond LintFaera Dyann Goodspeed 11/13/2017

## 2017-11-13 NOTE — Progress Notes (Signed)
Progress Note  Patient Name: Lee Moore Date of Encounter: 11/13/2017  Primary Cardiologist: No primary care provider on file.   Subjective   The patient feels significantly better today, he is allowed to eat clear liquid diet.   Inpatient Medications    Scheduled Meds: . aspirin EC  81 mg Oral Daily  . atorvastatin  80 mg Oral q1800  . carvedilol  9.375 mg Oral BID WC  . sacubitril-valsartan  1 tablet Oral BID  . spironolactone  25 mg Oral QHS   Continuous Infusions: . dextrose 5 % and 0.45 % NaCl with KCl 20 mEq/L 75 mL/hr at 11/12/17 2305  . famotidine (PEPCID) IV 20 mg (11/12/17 2152)  . heparin    . piperacillin-tazobactam (ZOSYN)  IV 3.375 g (11/13/17 0503)   PRN Meds: acetaminophen **OR** acetaminophen, diphenhydrAMINE **OR** diphenhydrAMINE, HYDROmorphone (DILAUDID) injection, methocarbamol, metoprolol tartrate, ondansetron **OR** ondansetron (ZOFRAN) IV   Vital Signs    Vitals:   11/12/17 2144 11/13/17 0057 11/13/17 0505 11/13/17 0606  BP: (!) 101/53 105/60 (!) 96/52 101/61  Pulse: (!) 40 (!) 40 (!) 36   Resp: 18 18 20    Temp: 98.4 F (36.9 C) 97.8 F (36.6 C) 97.8 F (36.6 C)   TempSrc: Oral Oral Oral   SpO2: 100% 97%    Weight:      Height:        Intake/Output Summary (Last 24 hours) at 11/13/2017 0910 Last data filed at 11/13/2017 0509 Gross per 24 hour  Intake 1251.13 ml  Output 300 ml  Net 951.13 ml   Filed Weights   11/11/17 2116 11/12/17 0437  Weight: 95.9 kg 96.5 kg    Telemetry    SR, PVCs - Personally Reviewed  ECG    SR, PVCs, anterior infarct, age undetermined, inferolateral ischemia unchanged from prior - Personally Reviewed  Physical Exam   GEN: No acute distress.   Neck: No JVD Cardiac: RRR, no murmurs, rubs, or gallops.  Respiratory: Clear to auscultation bilaterally. GI: Soft, nontender, non-distended  MS: No edema; No deformity. Neuro:  Nonfocal  Psych: Normal affect   Labs    Chemistry Recent Labs  Lab  11/11/17 2124 11/12/17 0508  NA 140 140  K 4.1 3.8  CL 104 108  CO2 26 23  GLUCOSE 97 98  BUN 13 12  CREATININE 1.21 1.11  CALCIUM 9.1 8.4*  PROT 7.9  --   ALBUMIN 4.7  --   AST 23  --   ALT 26  --   ALKPHOS 64  --   BILITOT 0.8  --   GFRNONAA >60 >60  GFRAA >60 >60  ANIONGAP 10 9     Hematology Recent Labs  Lab 11/11/17 2124 11/12/17 0508 11/13/17 0022  WBC 11.3* 10.3 7.8  RBC 4.97 4.23 3.98*  HGB 14.9 12.7* 12.1*  HCT 44.5 38.3* 36.4*  MCV 89.5 90.5 91.5  MCH 30.0 30.0 30.4  MCHC 33.5 33.2 33.2  RDW 14.2 14.7 14.7  PLT 245 249 228    Cardiac EnzymesNo results for input(s): TROPONINI in the last 168 hours. No results for input(s): TROPIPOC in the last 168 hours.   BNPNo results for input(s): BNP, PROBNP in the last 168 hours.   DDimer No results for input(s): DDIMER in the last 168 hours.   Radiology    Ct Abdomen Pelvis W Contrast  Result Date: 11/12/2017 CLINICAL DATA:  Right upper quadrant and epigastric pain today. Nausea and vomiting. Hematuria. EXAM: CT ABDOMEN AND PELVIS  WITH CONTRAST TECHNIQUE: Multidetector CT imaging of the abdomen and pelvis was performed using the standard protocol following bolus administration of intravenous contrast. CONTRAST:  100mL ISOVUE-300 IOPAMIDOL (ISOVUE-300) INJECTION 61% COMPARISON:  None. FINDINGS: Lower chest: Dependent atelectasis in the lung bases. Coronary artery calcifications. Possible coronary stents. Hepatobiliary: No focal liver abnormality is seen. No gallstones, gallbladder wall thickening, or biliary dilatation. Pancreas: Unremarkable. No pancreatic ductal dilatation or surrounding inflammatory changes. Spleen: Normal in size without focal abnormality. Adrenals/Urinary Tract: Adrenal glands are unremarkable. Kidneys are normal, without renal calculi, focal lesion, or hydronephrosis. Bladder is unremarkable. Stomach/Bowel: Distended appendix with diameter measuring 12 mm. Periappendiceal stranding and  periappendiceal fluid. Small amount of free fluid in the pelvis. Changes consistent with acute appendicitis. Appendix: Location: Medial and inferior to the cecum, extending along the psoas margin to the right pelvis Diameter: 12 mm Appendicolith: No Mucosal hyper-enhancement: Mild Extraluminal gas: No Periappendiceal collection: No discrete collection. Periappendiceal stranding and edema demonstrated. Stomach, small bowel, and colon are not abnormally distended. No wall thickening is appreciated. Sigmoid colonic diverticulosis without evidence of diverticulitis. Vascular/Lymphatic: No significant vascular findings are present. No enlarged abdominal or pelvic lymph nodes. Reproductive: Prostate is unremarkable. Other: No free air in the abdomen. Small periumbilical hernias containing fat. Musculoskeletal: No acute or significant osseous findings. IMPRESSION: 1. Acute appendicitis. No abscess. Periappendiceal stranding and fluid. 2. Small amount of free fluid in the pelvis, likely reactive. 3. Sigmoid colonic diverticulosis without evidence of diverticulitis. Electronically Signed   By: Burman NievesWilliam  Stevens M.D.   On: 11/12/2017 00:14    Cardiac Studies   TTE; 10/27/2017  - Left ventricle: The cavity size was normal. Wall thickness was normal. Systolic function was normal. The estimated ejection fraction was in the range of 50% to 55%. Mild basal inferior hypokinesis. Doppler parameters are consistent with abnormal left ventricular relaxation (grade 1 diastolic dysfunction). The E/e&' ratio is <8, suggesting normal LV filling pressure. - Mitral valve: Mildly thickened leaflets . There was trivial regurgitation. - Left atrium: The atrium was normal in size. - Right atrium: The atrium was mildly dilated. - Atrial septum: Aneurysmal. Cannot exclude PFO. - Tricuspid valve: There was trivial regurgitation. - Pulmonary arteries: PA peak pressure: 26 mm Hg (S). - Inferior vena cava: The vessel was  normal in size. The respirophasic diameter changes were in the normal range (= 50%), consistent with normal central venous pressure.  Impressions:  - LVEF 50-55%, normal wall thickness, normal wall motion, grade 1 DD, normal LV filling pressure, trivial MR, mild RAE, aneursymal IAS- cannot exclude PFO, trivial TR, RVSP 26 mmHg, normal IVC. Compared to a prior study in 07/2017, the LVEF has improved.   Patient Profile     42 y.o. male with CAD, multiple stenting, including STEMI with RCA PCI in May 2019  Assessment & Plan     1. 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 Surgery held both Brilinta and aspirin since yesterday - I have discussed the case with Dr Sheliah HatchKinsinger, he agrees with starting Heparin drip and starting aspirin - he has  improved significantly, surgery is trying clear liquids, however if his symptoms worsen he will go to te OR tomorrow. - we will be ok with surgery, we would suggest that [facility] for the proximity to the cardiac team if needed, I would continue aspirin, heparin drip till surgery, retart heparin drip as soon as possible after the surgery, restart Brilinta once heparin is discontinued  2. VT: Patient had pulseless VT in setting of inferior STEMI. Frequent PVCs on the monitor.  3. Chronic systolic CHF: Ischemic cardiomyopathy. Appears euvolemic. - Echo (5/19): EF 30-35%, inferior WMA. - Echo (7/19): EF 50-55%  4. CKD: resolved  For questions or updates,  please contact [facility] Please consult [email or web address] for contact info under Cardiology/STEMI.     Signed, Tobias AlexanderKatarina Jameison Haji, MD  11/13/2017, 9:10 AM

## 2017-11-13 NOTE — Progress Notes (Signed)
ANTICOAGULATION CONSULT NOTE   Pharmacy Consult for Heparin Indication: chronic dual anti-Plt therapy for hx ACS/STEMI May 2019  No Known Allergies  Patient Measurements: Height: 5\' 9"  (175.3 cm) Weight: 210 lb (95.3 kg) IBW/kg (Calculated) : 70.7 Heparin Dosing Weight: 91 kg  Vital Signs: Temp: 98.6 F (37 C) (08/18 1343) Temp Source: Oral (08/18 1343) BP: 122/67 (08/18 1343) Pulse Rate: 98 (08/18 1343)  Labs: Recent Labs    11/11/17 2124 11/12/17 0508 11/12/17 1032  11/13/17 0022 11/13/17 0719 11/13/17 1701  HGB 14.9 12.7*  --   --  12.1*  --   --   HCT 44.5 38.3*  --   --  36.4*  --   --   PLT 245 249  --   --  228  --   --   APTT  --   --  33  --   --   --   --   LABPROT  --   --  14.9  --   --   --   --   INR  --   --  1.18  --   --   --   --   HEPARINUNFRC  --   --   --    < > 0.36 0.28* 0.44  CREATININE 1.21 1.11  --   --   --   --   --    < > = values in this interval not displayed.   Estimated Creatinine Clearance: 98.7 mL/min (by C-G formula based on SCr of 1.11 mg/dL).  Medical History: Past Medical History:  Diagnosis Date  . MI (myocardial infarction) (HCC)    Medications:  PTA meds include: Brilinta 90mg  BID (last dose 8/16)                                 ASA 81mg  QD (last dose 8/16)  Assessment: [age] yr male admitted with abdominal pain; surgery consulted for acute appendicitis.  PMH significant for STEMI s/p stent placement in May 2019. Pharmacy consulted to dose IV heparin for anti-coagulation while dual anti-PLT therapy held - Cardiology discussed with Surgery team.  Heparin level therapeutic. CBC stable. No bleed documented.  Goal of Therapy:  Heparin level 0.3-0.7 units/ml Monitor platelets by anticoagulation protocol: Yes   Plan:  Continue Heparin infusion at 1300 units/hr Confirmatory heparin level with AM labs Monitor daily heparin level and CBC, s/sx bleeding F/u Cards/Surgery plans  Babs BertinHaley Brieonna Crutcher, PharmD, BCPS Clinical  Pharmacist 11/13/2017 5:48 PM

## 2017-11-13 NOTE — Progress Notes (Signed)
ANTICOAGULATION CONSULT NOTE - Follow Up Consult  Pharmacy Consult for Heparin Indication: chest pain/ACS  No Known Allergies  Patient Measurements: Height: 5\' 9"  (175.3 cm) Weight: 212 lb 11.2 oz (96.5 kg) IBW/kg (Calculated) : 70.7 Heparin Dosing Weight:   Vital Signs: Temp: 97.8 F (36.6 C) (08/18 0057) Temp Source: Oral (08/18 0057) BP: 105/60 (08/18 0057) Pulse Rate: 40 (08/18 0057)  Labs: Recent Labs    11/11/17 2124 11/12/17 0508 11/12/17 1032 11/12/17 1810 11/13/17 0022  HGB 14.9 12.7*  --   --  12.1*  HCT 44.5 38.3*  --   --  36.4*  PLT 245 249  --   --  228  APTT  --   --  33  --   --   LABPROT  --   --  14.9  --   --   INR  --   --  1.18  --   --   HEPARINUNFRC  --   --   --  0.40 0.36  CREATININE 1.21 1.11  --   --   --     Estimated Creatinine Clearance: 99.3 mL/min (by C-G formula based on SCr of 1.11 mg/dL).   Medications:  Infusions:  . dextrose 5 % and 0.45 % NaCl with KCl 20 mEq/L 75 mL/hr at 11/12/17 2305  . famotidine (PEPCID) IV 20 mg (11/12/17 2152)  . heparin 1,100 Units/hr (11/12/17 1147)  . piperacillin-tazobactam (ZOSYN)  IV 3.375 g (11/12/17 2249)    Assessment: Patient with heparin level at goal.  No heparin issues noted.  Goal of Therapy:  Heparin level 0.3-0.7 units/ml Monitor platelets by anticoagulation protocol: Yes   Plan:  Continue heparin drip at current rate Recheck level with AM labs  Aleene DavidsonGrimsley Jr, Lenette Rau Crowford 11/13/2017,2:46 AM

## 2017-11-13 NOTE — Progress Notes (Signed)
ANTICOAGULATION CONSULT NOTE   Pharmacy Consult for Heparin Indication: chronic dual anti-Plt therapy for hx ACS/STEMI May 2019  No Known Allergies  Patient Measurements: Height: 5\' 9"  (175.3 cm) Weight: 212 lb 11.2 oz (96.5 kg) IBW/kg (Calculated) : 70.7 Heparin Dosing Weight: 91 kg  Vital Signs: Temp: 97.8 F (36.6 C) (08/18 0505) Temp Source: Oral (08/18 0505) BP: 101/61 (08/18 0606) Pulse Rate: 36 (08/18 0505)  Labs: Recent Labs    11/11/17 2124 11/12/17 0508 11/12/17 1032 11/12/17 1810 11/13/17 0022 11/13/17 0719  HGB 14.9 12.7*  --   --  12.1*  --   HCT 44.5 38.3*  --   --  36.4*  --   PLT 245 249  --   --  228  --   APTT  --   --  33  --   --   --   LABPROT  --   --  14.9  --   --   --   INR  --   --  1.18  --   --   --   HEPARINUNFRC  --   --   --  0.40 0.36 0.28*  CREATININE 1.21 1.11  --   --   --   --    Estimated Creatinine Clearance: 99.3 mL/min (by C-G formula based on SCr of 1.11 mg/dL).  Medical History: Past Medical History:  Diagnosis Date  . MI (myocardial infarction) (HCC)    Medications:  PTA meds include: Brilinta 90mg  BID (last dose 8/16)                                 ASA 81mg  QD (last dose 8/16)  Assessment:  [age] yr male admitted with abdominal pain; surgery consulted for acute appendicitis.  PMH significant for STEMI s/p stent placement in May 2019.   Pharmacy consulted to dose IV heparin for anti-coagulation while dual anti-PLT therapy held  Heparin 4000 unit bolus, infusion at 1100 units/hr  Today, 11/13/2017 Heparin level 0.28 units/ml, subtherapeutic CBC, decreased post-op, stable, Plt wnl CL diet today per surgery,  If worse clinically, probable appendectomy tomorrow, If improved, restart brilinta and advance diet.    Goal of Therapy:  Heparin level 0.3-0.7 units/ml Monitor platelets by anticoagulation protocol: Yes   Plan:   Increase Heparin infusion to 1300 units/hr  Re-check level in 8 hours  Daily Heparin level,  CBC while on Heparin  Otho BellowsGreen, Deneise Getty L PharmD Pager [phone] 11/13/2017, 8:58 AM

## 2017-11-14 ENCOUNTER — Ambulatory Visit (HOSPITAL_COMMUNITY): Payer: BLUE CROSS/BLUE SHIELD

## 2017-11-14 DIAGNOSIS — I2510 Atherosclerotic heart disease of native coronary artery without angina pectoris: Secondary | ICD-10-CM

## 2017-11-14 DIAGNOSIS — Z9861 Coronary angioplasty status: Secondary | ICD-10-CM

## 2017-11-14 DIAGNOSIS — K3532 Acute appendicitis with perforation and localized peritonitis, without abscess: Secondary | ICD-10-CM

## 2017-11-14 LAB — CBC
HCT: 37.9 % — ABNORMAL LOW (ref 39.0–52.0)
HEMOGLOBIN: 12.2 g/dL — AB (ref 13.0–17.0)
MCH: 29.9 pg (ref 26.0–34.0)
MCHC: 32.2 g/dL (ref 30.0–36.0)
MCV: 92.9 fL (ref 78.0–100.0)
PLATELETS: 218 10*3/uL (ref 150–400)
RBC: 4.08 MIL/uL — AB (ref 4.22–5.81)
RDW: 14.1 % (ref 11.5–15.5)
WBC: 4.9 10*3/uL (ref 4.0–10.5)

## 2017-11-14 LAB — SURGICAL PCR SCREEN
MRSA, PCR: NEGATIVE
STAPHYLOCOCCUS AUREUS: NEGATIVE

## 2017-11-14 LAB — HEPARIN LEVEL (UNFRACTIONATED): HEPARIN UNFRACTIONATED: 0.36 [IU]/mL (ref 0.30–0.70)

## 2017-11-14 MED ORDER — TICAGRELOR 90 MG PO TABS
90.0000 mg | ORAL_TABLET | Freq: Two times a day (BID) | ORAL | Status: DC
  Administered 2017-11-14 – 2017-11-15 (×3): 90 mg via ORAL
  Filled 2017-11-14 (×3): qty 1

## 2017-11-14 NOTE — Consult Note (Addendum)
Advanced Heart Failure Team Consult Note   Primary Physician: Patient, No Pcp Per PCP-Cardiologist:  No primary care provider on file.  Reason for Consultation: Appendicitis in CHF patient.   HPI:    Lee Moore is seen today for evaluation of cardiac preoperative clearance at the request of Dr. Isiaah Cuervo is a 42 y.o. male with h/o CAD s/p multiple stenting, MI in 07/2017 with VT arrest s/p RCA stenting, Chronic systolic CHF EF 30-35% in 5/19, and CKD II-III.   Admitted 11/11/17 with acute sharp, RLQ abdominal pain and diagnosed with appendicitis by CT ABD/Pelvis. Cardiology consulted for clearance and management of DAPT. ASA and Brilinta held.  He was cleared for surgery given normalization of EF on Echo 10/27/17.  He has been on bowel rest since 11/11/17 and has gradually and significantly improved. This am denies N/V and pain has much improved. Per CCS note this am plan to restart Brilinta and no plans for surgery at this time. He is feeling much better today. Ready to go home, tentatively tomorrow per pt.   Echo 10/27/17 - LVEF 50-55%, Grade 1 DD, Mild RAE, Trivial MR  CT ABD/Pelvis 11/12/17 1. Acute appendicitis. No abscess. Periappendiceal stranding and fluid. 2. Small amount of free fluid in the pelvis, likely reactive. 3. Sigmoid colonic diverticulosis without evidence of diverticulitis.  Review of Systems: [y] = yes, [ ]  = no   General: Weight gain [ ] ; Weight loss [ ] ; Anorexia [ ] ; Fatigue [y]; Fever [ ] ; Chills [ ] ; Weakness [ ]   Cardiac: Chest pain/pressure [ ] ; Resting SOB [ ] ; Exertional SOB [ ] ; Orthopnea [ ] ; Pedal Edema [ ] ; Palpitations [ ] ; Syncope [ ] ; Presyncope [ ] ; Paroxysmal nocturnal dyspnea[ ]   Pulmonary: Cough [ ] ; Wheezing[ ] ; Hemoptysis[ ] ; Sputum [ ] ; Snoring [ ]   GI: Vomiting[ ] ; Dysphagia[ ] ; Melena[ ] ; Hematochezia [ ] ; Heartburn[ ] ; Abdominal pain [y]; Constipation [ ] ; Diarrhea [ ] ; BRBPR [ ]   GU: Hematuria[ ] ; Dysuria [ ] ; Nocturia[ ]     Vascular: Pain in legs with walking [ ] ; Pain in feet with lying flat [ ] ; Non-healing sores [ ] ; Stroke [ ] ; TIA [ ] ; Slurred speech [ ] ;  Neuro: Headaches[ ] ; Vertigo[ ] ; Seizures[ ] ; Paresthesias[ ] ;Blurred vision [ ] ; Diplopia [ ] ; Vision changes [ ]   Ortho/Skin: Arthritis [ ] ; Joint pain [y]; Muscle pain [ ] ; Joint swelling [ ] ; Back Pain [ ] ; Rash [ ]   Psych: Depression[ ] ; Anxiety[ ]   Heme: Bleeding problems [ ] ; Clotting disorders [ ] ; Anemia [ ]   Endocrine: Diabetes [ ] ; Thyroid dysfunction[ ]   Home Medications Prior to Admission medications   Medication Sig Start Date End Date Taking? Authorizing Provider  aspirin EC 81 MG tablet Take 81 mg by mouth daily.   Yes [provider]  atorvastatin (LIPITOR) 80 MG tablet Take 1 tablet (80 mg total) by mouth daily at 6 PM. 08/06/17  Yes Strader, [place] M, PA-C  BRILINTA 90 MG TABS tablet Take 1 tablet (90 mg total) by mouth 2 (two) times daily. 08/06/17  Yes Strader, [place] M, PA-C  carvedilol (COREG) 6.25 MG tablet Take 1.5 tablets (9.375 mg total) by mouth 2 (two) times daily with a meal. 09/15/17  Yes Laurey Morale, MD  sacubitril-valsartan (ENTRESTO) 24-26 MG Take 1 tablet by mouth 2 (two) times daily. 08/06/17  Yes Strader, Lennart Pall, PA-C  spironolactone (ALDACTONE) 25 MG tablet Take 1 tablet (25 mg total)  by mouth at bedtime. 08/31/17  Yes Laurey MoraleMcLean, Dalton S, MD  traMADol (ULTRAM) 50 MG tablet Take 1 tablet (50 mg total) by mouth at bedtime as needed for moderate pain. Patient not taking: Reported on 09/15/2017 08/31/17   Ramiro Pangilinan, Bevelyn Bucklesaniel R, MD    Past Medical History: Past Medical History:  Diagnosis Date  . MI (myocardial infarction) [facility](HCC)     Past Surgical History: Past Surgical History:  Procedure Laterality Date  . CORONARY/GRAFT ACUTE MI REVASCULARIZATION N/A 07/30/2017   Procedure: Coronary/Graft Acute MI Revascularization;  Surgeon: Rinaldo CloudHarwani, Mohan, MD;  Location: [facility] INVASIVE CV LAB;  Service: Cardiovascular;   Laterality: N/A;  . LEFT HEART CATH AND CORONARY ANGIOGRAPHY N/A 07/30/2017   Procedure: LEFT HEART CATH AND CORONARY ANGIOGRAPHY;  Surgeon: Rinaldo CloudHarwani, Mohan, MD;  Location: [facility] INVASIVE CV LAB;  Service: Cardiovascular;  Laterality: N/A;    Family History: History reviewed. No pertinent family history.  Social History: Social History   Socioeconomic History  . Marital status: Married    Spouse name: Not on file  . Number of children: Not on file  . Years of education: Not on file  . Highest education level: Not on file  Occupational History  . Not on file  Social Needs  . Financial resource strain: Not on file  . Food insecurity:    Worry: Not on file    Inability: Not on file  . Transportation needs:    Medical: Not on file    Non-medical: Not on file  Tobacco Use  . Smoking status: Never Smoker  . Smokeless tobacco: Never Used  Substance and Sexual Activity  . Alcohol use: Not Currently    Comment: unknown  . Drug use: Never  . Sexual activity: Yes  Lifestyle  . Physical activity:    Days per week: Not on file    Minutes per session: Not on file  . Stress: Not on file  Relationships  . Social connections:    Talks on phone: Not on file    Gets together: Not on file    Attends religious service: Not on file    Active member of club or organization: Not on file    Attends meetings of clubs or organizations: Not on file    Relationship status: Not on file  Other Topics Concern  . Not on file  Social History Narrative   Senior VP at [employer]    Allergies:  No Known Allergies  Objective:    Vital Signs:   Temp:  [97.4 F (36.3 C)-98.9 F (37.2 C)] 97.4 F (36.3 C) (08/19 0407) Pulse Rate:  [50-105] 73 (08/19 0407) Resp:  [18-20] 19 (08/19 0407) BP: (104-122)/(54-68) 111/67 (08/19 0407) SpO2:  [98 %-100 %] 98 % (08/19 0407) Weight:  [95.3 kg] 95.3 kg (08/18 1343) Last BM Date: 11/13/17  Weight change: Filed Weights   11/11/17 2116 11/12/17 0437  11/13/17 1343  Weight: 95.9 kg 96.5 kg 95.3 kg    Intake/Output:   Intake/Output Summary (Last 24 hours) at 11/14/2017 0921 Last data filed at 11/14/2017 0600 Gross per 24 hour  Intake 3094.39 ml  Output 630 ml  Net 2464.39 ml      Physical Exam    General:  Well appearing. No resp difficulty HEENT: normal Neck: supple. JVP 6-7 cm. Carotids 2+ bilat; no bruits. No lymphadenopathy or thyromegaly appreciated. Cor: PMI nondisplaced. Regular rate & rhythm. No rubs, gallops or murmurs. Lungs: Clear Abdomen: soft, nontender, nondistended. No hepatosplenomegaly. No bruits or masses.  Good bowel sounds. Extremities: no cyanosis, clubbing, or rash. No edema. Neuro: alert & orientedx3, cranial nerves grossly intact. moves all 4 extremities w/o difficulty. Affect pleasant   Telemetry   NSR, personally reviewed.   EKG    No new tracings.    Labs   Basic Metabolic Panel: Recent Labs  Lab 11/11/17 2124 11/12/17 0508  NA 140 140  K 4.1 3.8  CL 104 108  CO2 26 23  GLUCOSE 97 98  BUN 13 12  CREATININE 1.21 1.11  CALCIUM 9.1 8.4*    Liver Function Tests: Recent Labs  Lab 11/11/17 2124  AST 23  ALT 26  ALKPHOS 64  BILITOT 0.8  PROT 7.9  ALBUMIN 4.7   Recent Labs  Lab 11/11/17 2124  LIPASE 23   No results for input(s): AMMONIA in the last 168 hours.  CBC: Recent Labs  Lab 11/11/17 2124 11/12/17 0508 11/13/17 0022 11/14/17 0516  WBC 11.3* 10.3 7.8 4.9  HGB 14.9 12.7* 12.1* 12.2*  HCT 44.5 38.3* 36.4* 37.9*  MCV 89.5 90.5 91.5 92.9  PLT 245 249 228 218    Cardiac Enzymes: No results for input(s): CKTOTAL, CKMB, CKMBINDEX, TROPONINI in the last 168 hours.  BNP: BNP (last 3 results) No results for input(s): BNP in the last 8760 hours.  ProBNP (last 3 results) No results for input(s): PROBNP in the last 8760 hours.   CBG: No results for input(s): GLUCAP in the last 168 hours.  Coagulation Studies: Recent Labs    11/12/17 1032  LABPROT 14.9    INR 1.18     Imaging    No results found.   Medications:     Current Medications: . aspirin EC  81 mg Oral Daily  . atorvastatin  80 mg Oral q1800  . carvedilol  9.375 mg Oral BID WC  . sacubitril-valsartan  1 tablet Oral BID  . spironolactone  25 mg Oral QHS  . ticagrelor  90 mg Oral BID     Infusions: . dextrose 5 % and 0.45 % NaCl with KCl 20 mEq/L 75 mL/hr at 11/14/17 0253  . famotidine (PEPCID) IV 20 mg (11/13/17 2130)  . heparin 1,300 Units/hr (11/14/17 0255)  . piperacillin-tazobactam (ZOSYN)  IV 3.375 g (11/14/17 0511)     Patient Profile   Lee Moore is a 42 y.o. male with h/o CAD s/p multiple stenting, MI in 07/2017 with VT arrest s/p RCA stenting, Chronic systolic CHF, and CKD II-III.   Presented for Acute abd pain 11/11/17 and found to have appendicitis.   Assessment/Plan   1. Acute appendicitis - Resolved with conservative therapy.  - Further per CCS. Does not appear he will need surgery at this time.  2. CAD:  - Initial MI in 2017, 5 stents to RCA system.  Inferior MI in 5/19, DES to proximal/mid RCA but distal RCA and PLV/PDA remained occluded.  No good options for opening. Left coronary system had minimal disease.  - No s/s of ischemia.    - Continue ASA 81 mg daily - Ticagrelor 90 bid resumed this am with no plans for surgery.  - Continue atorvastatin 80 mg daily.  3. Chronic systolic CHF:  - Ischemic cardiomyopathy.  NYHA class II, he is not volume overloaded on exam.  Echo in 7/19 showed EF improved to 50-55%.    - Volume status has been stable off lasix.  - Continue Entresto 24/26 bid.  - Continue Coreg 9.375 mg bid.   - Continue spironolactone 25 mg  daily.  - EF is out of range for ICD.  3. VT:  - Pulseless VT with CPR peri-MI.  He underwent hypothermia protocol.  EF has recovered to 50-55%.   - He has removed Lifevest and is off amiodarone.    Heart failure team will sign off as of 11/14/17  HF Medication Recommendations for  Home: Would resume all previous meds at PTA doses.   Follow up as an outpatient will be placed on AVS.   Medication concerns reviewed with patient and pharmacy team. Barriers identified: None at this time.   Length of Stay: 2  Luane School  11/14/2017, 9:21 AM  Advanced Heart Failure Team Pager [phone] (M-F; 7a - 4p)  Please contact [facility] for night-coverage after hours (4p -7a ) and weekends on [email or web address]  Patient seen and examined with the above-signed Advanced Practice Provider and/or Housestaff. I personally reviewed laboratory data, imaging studies and relevant notes. I independently examined the patient and formulated the important aspects of the plan. I have edited the note to reflect any of my changes or salient points. I have personally discussed the plan with the patient and/or family.  42 y/o male as above with CAD s/p acute MI and VF arrest in 5/19 followed by DES to RCA. EF initially 30-35%. Subsequently has done well and EF normalized, Remains on Brilinta for DAPT.   Admitted for acute appendicitis and Cardiology asked to see for pre-op evaluation. Fortunately appendicitis has resolved with medical therapy. Abdomina exam now completely benign and tolerating advancing diet.   Agree with medical management. Currently no HF on exam or by history. If needs surgery, OK to hold Brilinta for 5 days but it seems as if he will not need surgery so would continue.   We will sign off. Please resume all home cardiac meds. We will arrange f/u with Dr. Shirlee Latch. Please call with questions.   Arvilla Meres, MD  10:40 PM

## 2017-11-14 NOTE — Progress Notes (Signed)
   Subjective/Chief Complaint: Continues to make significant improvements. Denies n/v. Abdominal pain much improved relative to Friday and stable from yesterday.   Objective: Vital signs in last 24 hours: Temp:  [97.4 F (36.3 C)-98.9 F (37.2 C)] 97.4 F (36.3 C) (08/19 0407) Pulse Rate:  [50-105] 73 (08/19 0407) Resp:  [18-20] 19 (08/19 0407) BP: (104-122)/(54-68) 111/67 (08/19 0407) SpO2:  [98 %-100 %] 98 % (08/19 0407) Weight:  [95.3 kg] 95.3 kg (08/18 1343) Last BM Date: 11/13/17  Intake/Output from previous day: 08/18 0701 - 08/19 0700 In: 3094.4 [P.O.:940; I.V.:2012.4; IV Piggyback:142] Out: 630 [Urine:630] Intake/Output this shift: No intake/output data recorded.  General appearance: alert, cooperative and no distress Resp: breathing comfortably GI: soft, non tender, non distended Extremities: extremities normal, atraumatic, no cyanosis or edema  Lab Results:  Recent Labs    11/13/17 0022 11/14/17 0516  WBC 7.8 4.9  HGB 12.1* 12.2*  HCT 36.4* 37.9*  PLT 228 218   BMET Recent Labs    11/11/17 2124 11/12/17 0508  NA 140 140  K 4.1 3.8  CL 104 108  CO2 26 23  GLUCOSE 97 98  BUN 13 12  CREATININE 1.21 1.11  CALCIUM 9.1 8.4*   PT/INR Recent Labs    11/12/17 1032  LABPROT 14.9  INR 1.18   ABG No results for input(s): PHART, HCO3 in the last 72 hours.  Invalid input(s): PCO2, PO2  Studies/Results: No results found.  Anti-infectives: Anti-infectives (From admission, onward)   Start     Dose/Rate Route Frequency Ordered Stop   11/12/17 0430  piperacillin-tazobactam (ZOSYN) IVPB 3.375 g     3.375 g 12.5 mL/hr over 240 Minutes Intravenous Every 8 hours 11/12/17 0429     11/11/17 2230  cefTRIAXone (ROCEPHIN) 2 g in sodium chloride 0.9 % 100 mL IVPB     2 g 200 mL/hr over 30 Minutes Intravenous  Once 11/11/17 2225 11/12/17 0023   11/11/17 2230  metroNIDAZOLE (FLAGYL) IVPB 500 mg     500 mg 100 mL/hr over 60 Minutes Intravenous  Once  11/11/17 2225 11/11/17 2342      Assessment/Plan: s/p * No surgery found * acute appendicitis  CAD, ICM, s/p v fib arrest and stenting 07/2017.  Pt with significant clinical improvement.  No fevers, WBC remains normal.  Diet as tolerated Plan to restart Brilenta   LOS: 2 days   Andria MeuseChristopher M Averee Harb 11/14/2017

## 2017-11-14 NOTE — Progress Notes (Signed)
ANTICOAGULATION CONSULT NOTE   Pharmacy Consult for Heparin Indication: chronic dual anti-Plt therapy for hx ACS/STEMI May 2019  No Known Allergies  Patient Measurements: Height: 5\' 9"  (175.3 cm) Weight: 210 lb (95.3 kg) IBW/kg (Calculated) : 70.7 Heparin Dosing Weight: 91 kg  Vital Signs: Temp: 97.4 F (36.3 C) (08/19 0407) Temp Source: Oral (08/19 0407) BP: 111/67 (08/19 0407) Pulse Rate: 73 (08/19 0407)  Labs: Recent Labs    11/11/17 2124 11/12/17 0508 11/12/17 1032  11/13/17 0022 11/13/17 0719 11/13/17 1701 11/14/17 0516  HGB 14.9 12.7*  --   --  12.1*  --   --  12.2*  HCT 44.5 38.3*  --   --  36.4*  --   --  37.9*  PLT 245 249  --   --  228  --   --  218  APTT  --   --  33  --   --   --   --   --   LABPROT  --   --  14.9  --   --   --   --   --   INR  --   --  1.18  --   --   --   --   --   HEPARINUNFRC  --   --   --    < > 0.36 0.28* 0.44 0.36  CREATININE 1.21 1.11  --   --   --   --   --   --    < > = values in this interval not displayed.   Estimated Creatinine Clearance: 98.7 mL/min (by C-G formula based on SCr of 1.11 mg/dL).  Medical History: Past Medical History:  Diagnosis Date  . MI (myocardial infarction) (HCC)    Medications:  PTA meds include: Brilinta 90mg  BID (last dose 8/16)                                 ASA 81mg  QD (last dose 8/16)  Assessment: [age] yr male admitted with abdominal pain; surgery consulted for acute appendicitis.  PMH significant for STEMI s/p stent placement in May 2019. Pharmacy consulted to dose IV heparin for anti-coagulation while dual anti-PLT therapy held - Cardiology discussed with Surgery team.  Heparin level therapeutic this AM. CBC stable. No bleed documented.  Goal of Therapy:  Heparin level 0.3-0.7 units/ml Monitor platelets by anticoagulation protocol: Yes   Plan:  Per CCS, no plans for surgery, resumed Brilinta today. D/C IV heparin.  Jenetta DownerJessica Jakaria Lavergne, Pharm D, BCPS, [facility] Clinical Pharmacist Phone [phone]  [phone]  11/14/2017 11:04 AM

## 2017-11-15 LAB — BASIC METABOLIC PANEL
ANION GAP: 9 (ref 5–15)
BUN: 5 mg/dL — AB (ref 6–20)
CALCIUM: 9 mg/dL (ref 8.9–10.3)
CO2: 23 mmol/L (ref 22–32)
Chloride: 109 mmol/L (ref 98–111)
Creatinine, Ser: 1.2 mg/dL (ref 0.61–1.24)
GFR calc Af Amer: >60 mL/min (ref >60)
GLUCOSE: 104 mg/dL — AB (ref 70–99)
Potassium: 3.8 mmol/L (ref 3.5–5.1)
Sodium: 141 mmol/L (ref 135–145)

## 2017-11-15 LAB — CBC
HEMATOCRIT: 38.4 % — AB (ref 39.0–52.0)
Hemoglobin: 12.5 g/dL — ABNORMAL LOW (ref 13.0–17.0)
MCH: 29.9 pg (ref 26.0–34.0)
MCHC: 32.6 g/dL (ref 30.0–36.0)
MCV: 91.9 fL (ref 78.0–100.0)
PLATELETS: 249 10*3/uL (ref 150–400)
RBC: 4.18 MIL/uL — ABNORMAL LOW (ref 4.22–5.81)
RDW: 13.8 % (ref 11.5–15.5)
WBC: 3.8 10*3/uL — ABNORMAL LOW (ref 4.0–10.5)

## 2017-11-15 MED ORDER — FAMOTIDINE 20 MG PO TABS
20.0000 mg | ORAL_TABLET | Freq: Two times a day (BID) | ORAL | Status: DC
Start: 2017-11-15 — End: 2017-11-15
  Administered 2017-11-15: 20 mg via ORAL
  Filled 2017-11-15: qty 1

## 2017-11-15 MED ORDER — AMOXICILLIN-POT CLAVULANATE 875-125 MG PO TABS: 1.0000 | ORAL_TABLET | Freq: Two times a day (BID) | ORAL | 0 refills | Status: AC

## 2017-11-15 NOTE — Discharge Instructions (Signed)
Appendicitis °The appendix is a tube that is shaped like a finger. It is connected to the large intestine. Appendicitis means that this tube is swollen (inflamed). Without treatment, the tube can tear (rupture). This can lead to a life-threatening infection. It can also cause you to have sores (abscesses). These sores hurt. °What are the causes? °This condition may be caused by something that blocks the appendix, such as: °· A ball of poop (stool). °· Lymph glands that are bigger than normal. ° °Sometimes, the cause is not known. °What are the signs or symptoms? °Symptoms of this condition include: °· Pain around the belly button (navel). °? The pain moves toward the lower right belly (abdomen). °? The pain can get worse with time. °? The pain can get worse if you cough. °? The pain can get worse if you move suddenly. °· Tenderness in the lower right belly. °· Feeling sick to your stomach (nauseous). °· Throwing up (vomiting). °· Not feeling hungry (loss of appetite). °· A fever. °· Having a hard time pooping (constipation). °· Watery poop (diarrhea). °· Not feeling well. ° °How is this treated? °Usually, this condition is treated by taking out the appendix (appendectomy). There are two ways that the appendix can be taken out: °· Open surgery. In this surgery, the appendix is taken out through a large cut (incision). The cut is made in the lower right belly. This surgery may be picked if: °? You have scars from another surgery. °? You have a bleeding condition. °? You are pregnant and will be having your baby soon. °? You have a condition that does not allow the other type of surgery. °· Laparoscopic surgery. In this surgery, the appendix is taken out through small cuts. Often, this surgery: °? Causes less pain. °? Causes fewer problems. °? Is easier to heal from. ° °If your appendix tears and a sore forms: °· A drain may be put into the sore. The drain will be used to get rid of fluid. °· You may get an antibiotic  medicine through an IV tube. °· Your appendix may or may not need to be taken out. ° °This information is not intended to replace advice given to you by your health care provider. Make sure you discuss any questions you have with your health care provider. °Document Released: 06/07/2011 Document Revised: 08/21/2015 Document Reviewed: 07/31/2014 °Elsevier Interactive Patient Education © 2018 [employer]vier Inc. ° °

## 2017-11-15 NOTE — Plan of Care (Signed)
  Problem: Health Behavior/Discharge Planning: Goal: Ability to manage health-related needs will improve Outcome: Adequate for Discharge   Problem: Clinical Measurements: Goal: Ability to maintain clinical measurements within normal limits will improve Outcome: Adequate for Discharge Goal: Will remain free from infection Outcome: Adequate for Discharge Goal: Diagnostic test results will improve Outcome: Adequate for Discharge Goal: Respiratory complications will improve Outcome: Adequate for Discharge Goal: Cardiovascular complication will be avoided Outcome: Adequate for Discharge   Problem: Activity: Goal: Risk for activity intolerance will decrease Outcome: Adequate for Discharge   Problem: Nutrition: Goal: Adequate nutrition will be maintained Outcome: Adequate for Discharge   Problem: Elimination: Goal: Will not experience complications related to bowel motility Outcome: Adequate for Discharge Goal: Will not experience complications related to urinary retention Outcome: Adequate for Discharge   Problem: Pain Managment: Goal: General experience of comfort will improve Outcome: Adequate for Discharge   Problem: Safety: Goal: Ability to remain free from injury will improve Outcome: Adequate for Discharge   Problem: Skin Integrity: Goal: Risk for impaired skin integrity will decrease Outcome: Adequate for Discharge   

## 2017-11-15 NOTE — Discharge Summary (Signed)
Patient ID: Lee Moore [name] Aug 21, 1975 42 y.o.  Admit date: 11/11/2017 Discharge date: 11/15/2017  Admitting Diagnosis: Acute appendicitis Recent V fib arrest secondary to STEMI  Discharge Diagnosis Patient Active Problem List   Diagnosis Date Noted  . Acute appendicitis 11/12/2017  . ST elevation myocardial infarction (STEMI) of inferior wall (HCC) 08/06/2017  . Essential hypertension 08/06/2017  . Ischemic cardiomyopathy 08/06/2017  . Cardiogenic shock (HCC)   . Cardiac arrest with ventricular fibrillation (HCC) 07/30/2017    Consultants Cardiology Heart failure team  Reason for Admission: 42 yo male with 1 day of abdominal pain. Pain came on suddenly. It is sharp and located in the right lower quadrant. He vomited 2 times overnight. The pain is slightly improved this morning. He has never had a similar pain to this. He had an MI in 07/2017 with stents placed and is on ASA and brilinta with last brilinta dose being 8/16 at 9am.  Procedures none  Hospital Course:  The patient was admitted and his Brilinta was initially held for possible OR.  He was started on Zosyn with the plan to try and treat conservatively.  His pain began to improve with abx therapy and his Brilinta was able to be restarted over the next day or so.  His diet was also able to be advanced.  He was AF with a normal WBC.  On HD 3, he was tolerating a regular diet with no pain and was otherwise felt stable for DC home.  He was evaluated by cardiology as well as the heart failure team while here.  He was given clearance that if he were to worsen and need surgery, his Brilinta could be held for 5 days and he was stable to proceed with surgical intervention.  As of now, we will likely plan for an interval appendectomy in the future when he is further out from his arrest/MI.  Physical Exam: Heart: regular Lungs: CTAB Abd: soft, NT, ND, +BS  Allergies as of 11/15/2017   No Known Allergies       Medication List    TAKE these medications   amoxicillin-clavulanate 875-125 MG tablet Commonly known as:  AUGMENTIN Take 1 tablet by mouth 2 (two) times daily for 4 days.   aspirin EC 81 MG tablet Take 81 mg by mouth daily.   atorvastatin 80 MG tablet Commonly known as:  LIPITOR Take 1 tablet (80 mg total) by mouth daily at 6 PM.   BRILINTA 90 MG Tabs tablet Generic drug:  ticagrelor Take 1 tablet (90 mg total) by mouth 2 (two) times daily.   carvedilol 6.25 MG tablet Commonly known as:  COREG Take 1.5 tablets (9.375 mg total) by mouth 2 (two) times daily with a meal.   sacubitril-valsartan 24-26 MG Commonly known as:  ENTRESTO Take 1 tablet by mouth 2 (two) times daily.   spironolactone 25 MG tablet Commonly known as:  ALDACTONE Take 1 tablet (25 mg total) by mouth at bedtime.   traMADol 50 MG tablet Commonly known as:  ULTRAM Take 1 tablet (50 mg total) by mouth at bedtime as needed for moderate pain.        Follow-up Information    [facility] Follow up on 12/01/2017.   Specialty:  Cardiology Why:  Heart Failure Follow @ [facility]:00-Park/Dropoff at ER lot (enter blue "Specialty Clinics" awning) or under [facility] on [address] ([address] entrance, garage code: 1600, elevator 1st floor). Take all am meds,  bring all med bottles. Contact information: [address] [id number] [place] [id number] [phone]       Kinsinger, De BlanchLuke Aaron, MD Follow up in 4 week(s).   Specialty:  General Surgery Why:  our office will call you with a follow up date and time Contact information: [address] [place] [address] [phone]           Signed: Barnetta ChapelKelly Chenee Munns, [facility] 11/15/2017, 10:09 AM Pager: [phone]

## 2017-11-16 ENCOUNTER — Ambulatory Visit (HOSPITAL_COMMUNITY): Payer: BLUE CROSS/BLUE SHIELD

## 2017-11-18 ENCOUNTER — Ambulatory Visit (HOSPITAL_COMMUNITY): Payer: BLUE CROSS/BLUE SHIELD

## 2017-11-21 ENCOUNTER — Ambulatory Visit (HOSPITAL_COMMUNITY): Payer: BLUE CROSS/BLUE SHIELD

## 2017-11-21 ENCOUNTER — Encounter (HOSPITAL_COMMUNITY): Payer: BLUE CROSS/BLUE SHIELD

## 2017-11-23 ENCOUNTER — Ambulatory Visit (HOSPITAL_COMMUNITY): Payer: BLUE CROSS/BLUE SHIELD

## 2017-11-25 ENCOUNTER — Ambulatory Visit (HOSPITAL_COMMUNITY): Payer: BLUE CROSS/BLUE SHIELD

## 2017-11-30 ENCOUNTER — Ambulatory Visit (HOSPITAL_COMMUNITY): Payer: BLUE CROSS/BLUE SHIELD

## 2017-12-01 ENCOUNTER — Encounter (HOSPITAL_COMMUNITY): Payer: BLUE CROSS/BLUE SHIELD

## 2017-12-02 ENCOUNTER — Ambulatory Visit (HOSPITAL_COMMUNITY): Payer: BLUE CROSS/BLUE SHIELD

## 2017-12-05 ENCOUNTER — Ambulatory Visit (HOSPITAL_COMMUNITY): Payer: BLUE CROSS/BLUE SHIELD

## 2017-12-05 ENCOUNTER — Encounter (HOSPITAL_COMMUNITY): Payer: Self-pay

## 2017-12-05 ENCOUNTER — Ambulatory Visit (HOSPITAL_COMMUNITY)
Admission: RE | Admit: 2017-12-05 | Discharge: 2017-12-05 | Disposition: A | Discharge status: Home / Self Care | Payer: BLUE CROSS/BLUE SHIELD | Source: Ambulatory Visit | Attending: Cardiology | Admitting: Cardiology

## 2017-12-05 VITALS — BP 90/60 | HR 50 | Wt 206.0 lb

## 2017-12-05 DIAGNOSIS — I2510 Atherosclerotic heart disease of native coronary artery without angina pectoris: Secondary | ICD-10-CM | POA: Insufficient documentation

## 2017-12-05 DIAGNOSIS — I5022 Chronic systolic (congestive) heart failure: Secondary | ICD-10-CM | POA: Diagnosis not present

## 2017-12-05 DIAGNOSIS — I472 Ventricular tachycardia: Secondary | ICD-10-CM | POA: Insufficient documentation

## 2017-12-05 DIAGNOSIS — N182 Chronic kidney disease, stage 2 (mild): Secondary | ICD-10-CM | POA: Insufficient documentation

## 2017-12-05 DIAGNOSIS — I252 Old myocardial infarction: Secondary | ICD-10-CM | POA: Insufficient documentation

## 2017-12-05 DIAGNOSIS — R197 Diarrhea, unspecified: Secondary | ICD-10-CM | POA: Diagnosis not present

## 2017-12-05 DIAGNOSIS — Z79899 Other long term (current) drug therapy: Secondary | ICD-10-CM | POA: Diagnosis not present

## 2017-12-05 DIAGNOSIS — I255 Ischemic cardiomyopathy: Secondary | ICD-10-CM | POA: Diagnosis not present

## 2017-12-05 DIAGNOSIS — Z7982 Long term (current) use of aspirin: Secondary | ICD-10-CM | POA: Diagnosis not present

## 2017-12-05 LAB — BASIC METABOLIC PANEL
Anion gap: 9 (ref 5–15)
BUN: 10 mg/dL (ref 6–20)
CHLORIDE: 110 mmol/L (ref 98–111)
CO2: 21 mmol/L — ABNORMAL LOW (ref 22–32)
CREATININE: 1.12 mg/dL (ref 0.61–1.24)
Calcium: 9.2 mg/dL (ref 8.9–10.3)
GFR calc Af Amer: >60 mL/min (ref >60)
GFR calc non Af Amer: >60 mL/min (ref >60)
GLUCOSE: 116 mg/dL — AB (ref 70–99)
POTASSIUM: 3.7 mmol/L (ref 3.5–5.1)
Sodium: 140 mmol/L (ref 135–145)

## 2017-12-05 LAB — CBC
HCT: 40.1 % (ref 39.0–52.0)
Hemoglobin: 13.1 g/dL (ref 13.0–17.0)
MCH: 29.6 pg (ref 26.0–34.0)
MCHC: 32.7 g/dL (ref 30.0–36.0)
MCV: 90.7 fL (ref 78.0–100.0)
Platelets: 333 K/uL (ref 150–400)
RBC: 4.42 MIL/uL (ref 4.22–5.81)
RDW: 13.7 % (ref 11.5–15.5)
WBC: 8.1 K/uL (ref 4.0–10.5)

## 2017-12-05 NOTE — Patient Instructions (Addendum)
Routine lab work today. Will notify you of abnormal results, otherwise no news is good news!  HOLD Spironolactone and Entresto today.  Will refer you to Cardiac Rehab at [facility]. They will call you to set up initial appointment.  Follow up with Dr. Shirlee Latch in 2 weeks.  ____________________________________________________________________________ [place] Code: 1600  Take all medication as prescribed the day of your appointment. Bring all medications with you to your appointment.  Do the following things EVERYDAY: 1) Weigh yourself in the morning before breakfast. Write it down and keep it in a log. 2) Take your medicines as prescribed 3) Eat low salt foods-Limit salt (sodium) to 2000 mg per day.  4) Stay as active as you can everyday 5) Limit all fluids for the day to less than 2 liters

## 2017-12-05 NOTE — Progress Notes (Signed)
[facility] Cardiology: Dr. Shirlee Latch  42 yo with history of CAD s/p MI in 2017 and again in 5/19 presents for followup of CHF and CAD.  Patient had initial MI in [place] in 2017, he reports a total of 5 stents to the RCA system. He recently moved to [place] with work.  In 5/19, he had inferior STEMI complicated by multiple episodes of pulseless VT.  He had CPR and ultimately when to the cath lab.  This showed occluded proximal RCA but minimal disease on the left.  The proximal to mid RCA were stented, restoring flow to an acute marginal.  However, the distal RCA, PLV and PDA remained occluded. There were collaterals to the PLV from the left suggesting pre-existing disease.  He underwent hypothermia protocol and had full neurologic recovery from arrest.  Echo in 5/19 showed EF 30-35%.  Cardiac medications were titrated up and he was discharged with a Lifevest.   Repeat echo in 7/19 showed EF improved to 50-55%.    Admitted 11/12/17 with abdominal pain. He improved with medical management. He was discharged on augmentin.  Discharge date was 11/15/2017.   Today he returns for follow up. Overall feeling fair. Since discharge he has been having 8-20 loose bowel movements per day. He started having diarrhea the day he started Augmentin.  Having some dizziness. No chest pain. Denies SOB/PND/Orthopnea. Appetite ok.Having intermittent lower abdominal discomfort.  No fever or chills. Weight at home has been stable. Taking all medications but has only been taking 6.25 mg coreg twice a day instead of 9.375 mg twice a day. He is work full time at [employer]. He has not started cardiac rehab.   Labs (5/19): K 4.2 => 4.8, creatinine 1.54 => 1.38, hgb 11.7, AST 97, ALT 151 Labs (6/19): K 4.3, creatinine 1.16 => 1.08, LFTs normal Labs 11/15/2017: K 3.8 Creatinine 1.28   PMH: 1. CAD: MI in 2017, per report patient had a total of 5 stents in the RCA system.  - Inferior STEMI 5/19: LHC showed occluded RCA with relatively clear left  coronary system. DES to proximal-mid RCA restoring flow to an acute marginal, however, the distal RCA/PLV/PDA remained occluded.  There were collaterals to the PLV from the left.   2. VT: Patient had pulseless VT in setting of inferior STEMI.   3. Chronic systolic CHF: Ischemic cardiomyopathy.   - Echo (5/19): EF 30-35%, inferior WMA.   - Echo (7/19): EF 50-55% 4. CKD: Stage II.   FH: No premature CAD.   SH: Married with a child.  Works for [employer].  Moved to [place] from [place].  No smoking, rare ETOH.   ROS: All systems reviewed and negative except as per HPI.   Current Outpatient Medications  Medication Sig Dispense Refill  . aspirin EC 81 MG tablet Take 81 mg by mouth daily.    Marland Kitchen atorvastatin (LIPITOR) 80 MG tablet Take 1 tablet (80 mg total) by mouth daily at 6 PM. 30 tablet 6  . BRILINTA 90 MG TABS tablet Take 1 tablet (90 mg total) by mouth 2 (two) times daily. 60 tablet 6  . carvedilol (COREG) 6.25 MG tablet Take 6.25 mg by mouth 2 (two) times daily with a meal.    . sacubitril-valsartan (ENTRESTO) 24-26 MG Take 1 tablet by mouth 2 (two) times daily. 60 tablet 6  . spironolactone (ALDACTONE) 25 MG tablet Take 1 tablet (25 mg total) by mouth at bedtime. 30 tablet 3  . traMADol (ULTRAM) 50 MG tablet Take 1  tablet (50 mg total) by mouth at bedtime as needed for moderate pain. 30 tablet 0   No current facility-administered medications for this encounter.    BP 90/60 (BP Location: Left Arm, Patient Position: Standing, Cuff Size: Normal)   Pulse (!) 50   Wt 93.4 kg (206 lb)   SpO2 99%   BMI 30.42 kg/m  General:  Appears fatigued.  No resp difficulty HEENT: normal Neck: supple. no JVD. Carotids 2+ bilat; no bruits. No lymphadenopathy or thryomegaly appreciated. Cor: PMI nondisplaced. Regular rate & rhythm. No rubs, gallops or murmurs. Lungs: clear Abdomen: soft, nontender, nondistended. No hepatosplenomegaly. No bruits or masses. Good bowel sounds. Extremities: no cyanosis,  clubbing, rash, edema Neuro: alert & orientedx3, cranial nerves grossly intact. moves all 4 extremities w/o difficulty. Affect pleasant  Assessment/Plan: 1. CAD: Initial MI in 2017, 5 stents to RCA system.  Inferior MI in 5/19, DES to proximal/mid RCA but distal RCA and PLV/PDA remained occluded.  No good options for opening. Left coronary system had minimal disease.  - No s/s ischemia. - Continue ASA 81 daily and ticagrelor 90 bid.  - Continue atorvastatin 80 mg daily 2. Chronic systolic CHF: Ischemic cardiomyopathy.   NYHA II.  Echo in 7/19 showed EF improved to 50-55%.  Fatigue seems to be associated with diarrhea.    - Hold spiro and entresto until he returns. Hopefully once diarrhea resolved, we can restart.  - Continue Coreg 6.25 mg twice a day    -3. VT: Pulseless VT with CPR peri-MI.  He underwent hypothermia protocol.  EF has recovered to 50-55% 4.  Diarrhea ? Related to antibiotics. [facility] ordered stool sample.  Check CBC today.    Check BMET and CBC today. I provided him with letter to remains out of work until his next visit.  I would like to for him to attend cardiac rehab. He would like another referral.     Follow up 2-3 weeks with Dr Shirlee Latch.  Lincy Belles NP-C  12/05/2017

## 2017-12-07 ENCOUNTER — Ambulatory Visit (HOSPITAL_COMMUNITY): Payer: BLUE CROSS/BLUE SHIELD

## 2017-12-09 ENCOUNTER — Ambulatory Visit (HOSPITAL_COMMUNITY): Payer: BLUE CROSS/BLUE SHIELD

## 2017-12-12 ENCOUNTER — Ambulatory Visit (HOSPITAL_COMMUNITY): Payer: BLUE CROSS/BLUE SHIELD

## 2017-12-14 ENCOUNTER — Ambulatory Visit (HOSPITAL_COMMUNITY): Payer: BLUE CROSS/BLUE SHIELD

## 2017-12-16 ENCOUNTER — Ambulatory Visit (HOSPITAL_COMMUNITY): Payer: BLUE CROSS/BLUE SHIELD

## 2017-12-19 ENCOUNTER — Ambulatory Visit (HOSPITAL_COMMUNITY): Payer: BLUE CROSS/BLUE SHIELD

## 2017-12-21 ENCOUNTER — Ambulatory Visit (HOSPITAL_COMMUNITY): Payer: BLUE CROSS/BLUE SHIELD

## 2017-12-22 ENCOUNTER — Encounter (HOSPITAL_COMMUNITY): Payer: Self-pay | Admitting: Cardiology

## 2017-12-22 ENCOUNTER — Ambulatory Visit (HOSPITAL_COMMUNITY)
Admission: RE | Admit: 2017-12-22 | Discharge: 2017-12-22 | Disposition: A | Discharge status: Home / Self Care | Payer: BLUE CROSS/BLUE SHIELD | Source: Ambulatory Visit | Attending: Cardiology | Admitting: Cardiology

## 2017-12-22 VITALS — BP 120/72 | HR 83 | Wt 205.6 lb

## 2017-12-22 DIAGNOSIS — I2510 Atherosclerotic heart disease of native coronary artery without angina pectoris: Secondary | ICD-10-CM

## 2017-12-22 DIAGNOSIS — R197 Diarrhea, unspecified: Secondary | ICD-10-CM

## 2017-12-22 DIAGNOSIS — M109 Gout, unspecified: Secondary | ICD-10-CM | POA: Diagnosis not present

## 2017-12-22 DIAGNOSIS — Z79899 Other long term (current) drug therapy: Secondary | ICD-10-CM | POA: Diagnosis not present

## 2017-12-22 DIAGNOSIS — I252 Old myocardial infarction: Secondary | ICD-10-CM | POA: Insufficient documentation

## 2017-12-22 DIAGNOSIS — N182 Chronic kidney disease, stage 2 (mild): Secondary | ICD-10-CM | POA: Diagnosis not present

## 2017-12-22 DIAGNOSIS — I493 Ventricular premature depolarization: Secondary | ICD-10-CM | POA: Diagnosis not present

## 2017-12-22 DIAGNOSIS — Z7982 Long term (current) use of aspirin: Secondary | ICD-10-CM | POA: Insufficient documentation

## 2017-12-22 DIAGNOSIS — Z955 Presence of coronary angioplasty implant and graft: Secondary | ICD-10-CM | POA: Diagnosis not present

## 2017-12-22 DIAGNOSIS — A498 Other bacterial infections of unspecified site: Secondary | ICD-10-CM | POA: Diagnosis not present

## 2017-12-22 DIAGNOSIS — I255 Ischemic cardiomyopathy: Secondary | ICD-10-CM | POA: Diagnosis not present

## 2017-12-22 DIAGNOSIS — I5022 Chronic systolic (congestive) heart failure: Secondary | ICD-10-CM | POA: Diagnosis not present

## 2017-12-22 LAB — BASIC METABOLIC PANEL
ANION GAP: 8 (ref 5–15)
BUN: 10 mg/dL (ref 6–20)
CALCIUM: 8.9 mg/dL (ref 8.9–10.3)
CO2: 22 mmol/L (ref 22–32)
CREATININE: 1.02 mg/dL (ref 0.61–1.24)
Chloride: 109 mmol/L (ref 98–111)
Glucose, Bld: 101 mg/dL — ABNORMAL HIGH (ref 70–99)
Potassium: 4 mmol/L (ref 3.5–5.1)
Sodium: 139 mmol/L (ref 135–145)

## 2017-12-22 LAB — URIC ACID: URIC ACID, SERUM: 9 mg/dL — AB (ref 3.7–8.6)

## 2017-12-22 LAB — MAGNESIUM: MAGNESIUM: 1.9 mg/dL (ref 1.7–2.4)

## 2017-12-22 MED ORDER — SACUBITRIL-VALSARTAN 24-26 MG PO TABS: 1.0000 | ORAL_TABLET | Freq: Two times a day (BID) | ORAL | 3 refills | Status: DC

## 2017-12-22 MED ORDER — SPIRONOLACTONE 25 MG PO TABS: 12.5000 mg | ORAL_TABLET | Freq: Every day | ORAL | 3 refills | Status: DC

## 2017-12-22 MED ORDER — COLCHICINE 0.6 MG PO CAPS: ORAL_CAPSULE | ORAL | 3 refills | Status: DC

## 2017-12-22 MED ORDER — VANCOMYCIN HCL 125 MG PO CAPS: 125.0000 mg | ORAL_CAPSULE | Freq: Four times a day (QID) | ORAL | 0 refills | Status: DC

## 2017-12-22 NOTE — Progress Notes (Signed)
[facility] Cardiology: Dr. Shirlee Latch  42 yo with history of CAD s/p MI in 2017 and again in 5/19 presents for followup of CHF and CAD.  Patient had initial MI in [place] in 2017, he reports a total of 5 stents to the RCA system. He recently moved to [place] with work.  In 5/19, he had inferior STEMI complicated by multiple episodes of pulseless VT.  He had CPR and ultimately when to the cath lab.  This showed occluded proximal RCA but minimal disease on the left.  The proximal to mid RCA were stented, restoring flow to an acute marginal.  However, the distal RCA, PLV and PDA remained occluded. There were collaterals to the PLV from the left suggesting pre-existing disease.  He underwent hypothermia protocol and had full neurologic recovery from arrest.  Echo in 5/19 showed EF 30-35%.  Cardiac medications were titrated up and he was discharged with a Lifevest.    Repeat echo in 7/19 showed EF improved to 50-55%.    In 8/19, he was admitted with acute appendicitis.  He was managed medically with antibiotics.  Pain resolved, and it was decided not to operate on him immediately due to recent PCI.  He has not had any further appendicitis-type pain.  However, several weeks ago, he developed profuse diarrhea and was diagnosed with C difficile colitis.  He was started on po vancomycin.  He was not given much direction as to how long to take the vancomycin so stopped it on his own.  He had some recurrent diarrhea earlier this week, so restarted the vancomycin.    He has felt more fatigued since his bouts of appendicitis and C difficile diarrhea.  He is exhausted at the end of the work day.  No significant exertional dyspnea walking on flat ground or up a flight of stairs but tires easily.  Weight is down 1 lb since last appointment.  He is noted to have bigeminal PVCs on ECG today.  He rarely feels palpitations.  No chest pain.  No lightheadedness.  No orthopnea/PND.  Entresto and spironolactone were stopped at last  appointment due to dehydration in setting of profuse diarrhea.  BP is stable today.  Finally, he has gout-type pain at his left MTP.    Labs (5/19): K 4.2 => 4.8, creatinine 1.54 => 1.38, hgb 11.7, AST 97, ALT 151 Labs (6/19): K 4.3, creatinine 1.16 => 1.08, LFTs normal Labs (8/19): LDL 29, HDL 51, TGs 53 Labs (9/19): K 3.7, creatinine 1.12  ECG (personally reviewed): NSR, bigeminal PVCs, poor RWP, old inferior MI  PMH: 1. CAD: MI in 2017, per report patient had a total of 5 stents in the RCA system.  - Inferior STEMI 5/19: LHC showed occluded RCA with relatively clear left coronary system. DES to proximal-mid RCA restoring flow to an acute marginal, however, the distal RCA/PLV/PDA remained occluded.  There were collaterals to the PLV from the left.   2. VT: Patient had pulseless VT in setting of inferior STEMI.   3. Chronic systolic CHF: Ischemic cardiomyopathy.   - Echo (5/19): EF 30-35%, inferior WMA.   - Echo (7/19): EF 50-55% 4. CKD: Stage II.  5. Acute appendicitis: Managed medically in 8/19.  6. C difficile diarrhea 8/19-9/19 7. Gout  FH: No premature CAD.   SH: Married with a child.  Works for [employer].  Moved to [place] from [place].  No smoking, rare ETOH.   ROS: All systems reviewed and negative except as per HPI.   Current  Outpatient Medications  Medication Sig Dispense Refill  . aspirin EC 81 MG tablet Take 81 mg by mouth daily.    Marland Kitchen atorvastatin (LIPITOR) 80 MG tablet Take 1 tablet (80 mg total) by mouth daily at 6 PM. 30 tablet 6  . BRILINTA 90 MG TABS tablet Take 1 tablet (90 mg total) by mouth 2 (two) times daily. 60 tablet 6  . carvedilol (COREG) 6.25 MG tablet Take 6.25 mg by mouth 2 (two) times daily with a meal.    . traMADol (ULTRAM) 50 MG tablet Take 1 tablet (50 mg total) by mouth at bedtime as needed for moderate pain. 30 tablet 0  . Colchicine 0.6 MG CAPS Take 1 Tablet Twice today, then take daily as long as gout pain last 33 capsule 3  .  sacubitril-valsartan (ENTRESTO) 24-26 MG Take 1 tablet by mouth 2 (two) times daily. 60 tablet 3  . spironolactone (ALDACTONE) 25 MG tablet Take 0.5 tablets (12.5 mg total) by mouth daily. 45 tablet 3  . vancomycin (VANCOCIN) 125 MG capsule Take 1 capsule (125 mg total) by mouth 4 (four) times daily. 40 capsule 0   No current facility-administered medications for this encounter.    BP 120/72   Pulse 83   Wt 93.3 kg (205 lb 9.6 oz)   SpO2 98%   BMI 30.36 kg/m  General: NAD Neck: No JVD, no thyromegaly or thyroid nodule.  Lungs: Clear to auscultation bilaterally with normal respiratory effort. CV: Nondisplaced PMI.  Heart regular S1/S2, no S3/S4, no murmur.  No peripheral edema.  No carotid bruit.  Normal pedal pulses.  Abdomen: Soft, nontender, no hepatosplenomegaly, no distention.  Skin: Intact without lesions or rashes.  Neurologic: Alert and oriented x 3.  Psych: Normal affect. Extremities: No clubbing or cyanosis.  HEENT: Normal.   Assessment/Plan: 1. CAD: Initial MI in 2017, 5 stents to RCA system.  Inferior MI in 5/19, DES to proximal/mid RCA but distal RCA and PLV/PDA remained occluded.  No good options for opening. Left coronary system had minimal disease. No ischemic chest pain.  - Continue ASA 81 daily and ticagrelor 90 bid.  - Continue atorvastatin 80 mg daily, good lipids in 8/19.   - He is interested in cardiac rehab, I will refer.  2. Chronic systolic CHF: Ischemic cardiomyopathy.  NYHA class II, he is not volume overloaded on exam.  Echo in 7/19 showed EF improved to 50-55%.  He has been off Entresto and spironolactone in setting of profuse diarrhea, now mostly resolved with stable BP.   - Restart Entresto 24/26 bid.  - Continue Coreg 6.25 mg bid.   - Restart spironolactone at 12.5 mg daily.  - Not volume overloaded, no need for Lasix. - BMET today and again in 10 days.  - EF is out of range for ICD.  3. VT: Pulseless VT with CPR peri-MI.  He underwent hypothermia  protocol.  EF has recovered to 50-55%.   4. PVCs: Bigeminal PVCs on ECG today.   - I will have him wear 3 day Zio Patch to quantify PVCs, look for VT.  5. Suspected gout: Pain/swelling left 1st MTP, suspect gout.  - Check uric acid level today.  - symptomatic treatment with colchicine.  - Will likely eventually start him on allopurinol.  6. Appendicitis: Treated medically and resolved.  Has followup with [facility].  7. C difficile diarrhea: Had a relapse earlier in the week, restart po vancomycin and has resolved.  - Continue vancomycin qid.   -  I will arrange appt with ID to determine length of treatment.   He is exhausted after prolonged bout with C difficile diarrhea.  I will have him stay out of work for about 6 wks.   Followup with APP in 2 wks and me in 6 wks.   Marca Ancona 12/22/2017

## 2017-12-22 NOTE — Patient Instructions (Signed)
Labs today (will call for abnormal results, otherwise no news is good news)  START Vancomycin 125 mg 4 Times Daily for 10 days.  You have been referred to see Infectious Disease, they will contact you to schedule appointment.   START Colchicine 0.6 mg- Take twice today then take once daily until gout pain stops.  START Entresto 24-26 mg (1 Tablet) Twice Daily  START spironolactone 12.5 mg (0.5 Tablet) Once Daily  Zio patch has been applied for you to wear for 3 days--see box for instructions to return device.   Referral placed for cardiac rehab, they will contact you to schedule initial visit.  Labs in 10 days (bmet)  Follow up in our APP [facility] in 3 weeks  Follow up with Dr. Shirlee Latch in 6 weeks.

## 2017-12-23 ENCOUNTER — Telehealth (HOSPITAL_COMMUNITY): Payer: Self-pay

## 2017-12-23 ENCOUNTER — Ambulatory Visit (HOSPITAL_COMMUNITY): Payer: BLUE CROSS/BLUE SHIELD

## 2017-12-23 NOTE — Telephone Encounter (Signed)
Pt called voice mail left for pt to call back. Labs stable.  Uric acid is per Dr.McLean elevated, probably does indeed have gout

## 2017-12-26 ENCOUNTER — Ambulatory Visit (HOSPITAL_COMMUNITY): Payer: BLUE CROSS/BLUE SHIELD

## 2017-12-28 ENCOUNTER — Ambulatory Visit (HOSPITAL_COMMUNITY): Payer: BLUE CROSS/BLUE SHIELD

## 2017-12-30 ENCOUNTER — Ambulatory Visit (HOSPITAL_COMMUNITY): Payer: BLUE CROSS/BLUE SHIELD

## 2018-01-02 ENCOUNTER — Ambulatory Visit (HOSPITAL_COMMUNITY): Payer: BLUE CROSS/BLUE SHIELD

## 2018-01-02 ENCOUNTER — Ambulatory Visit (HOSPITAL_COMMUNITY)
Admission: RE | Admit: 2018-01-02 | Discharge: 2018-01-02 | Disposition: A | Discharge status: Home / Self Care | Payer: BLUE CROSS/BLUE SHIELD | Source: Ambulatory Visit | Attending: Internal Medicine | Admitting: Internal Medicine

## 2018-01-02 DIAGNOSIS — I5022 Chronic systolic (congestive) heart failure: Secondary | ICD-10-CM

## 2018-01-02 LAB — BASIC METABOLIC PANEL
ANION GAP: 5 (ref 5–15)
BUN: 9 mg/dL (ref 6–20)
CHLORIDE: 108 mmol/L (ref 98–111)
CO2: 25 mmol/L (ref 22–32)
Calcium: 9.4 mg/dL (ref 8.9–10.3)
Creatinine, Ser: 1.11 mg/dL (ref 0.61–1.24)
GFR calc Af Amer: >60 mL/min (ref >60)
GFR calc non Af Amer: >60 mL/min (ref >60)
Glucose, Bld: 108 mg/dL — ABNORMAL HIGH (ref 70–99)
POTASSIUM: 4.1 mmol/L (ref 3.5–5.1)
SODIUM: 138 mmol/L (ref 135–145)

## 2018-01-04 ENCOUNTER — Ambulatory Visit (HOSPITAL_COMMUNITY): Payer: BLUE CROSS/BLUE SHIELD

## 2018-01-06 ENCOUNTER — Ambulatory Visit (HOSPITAL_COMMUNITY): Payer: BLUE CROSS/BLUE SHIELD

## 2018-01-09 ENCOUNTER — Ambulatory Visit (HOSPITAL_COMMUNITY): Payer: BLUE CROSS/BLUE SHIELD

## 2018-01-11 ENCOUNTER — Ambulatory Visit (HOSPITAL_COMMUNITY): Payer: BLUE CROSS/BLUE SHIELD

## 2018-01-11 NOTE — Progress Notes (Addendum)
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  Patient had initial MI in [place] in 2017, he reports a total of 5 stents to the RCA system. He recently moved to [place] with work.  In 5/19, he had inferior STEMI complicated by multiple episodes of pulseless VT.  He had CPR and ultimately when to the cath lab.  This showed occluded proximal RCA but minimal disease on the left.  The proximal to mid RCA were stented, restoring flow to an acute marginal.  However, the distal RCA, PLV and PDA remained occluded. There were collaterals to the PLV from the left suggesting pre-existing disease.  He underwent hypothermia protocol and had full neurologic recovery from arrest.  Echo in 5/19 showed EF 30-35%.  Cardiac medications were titrated up and he was discharged with a Lifevest.    Repeat echo in 7/19 showed EF improved to 50-55%.    In 8/19, he was admitted with acute appendicitis.  He was managed medically with antibiotics.  Pain resolved, and it was decided not to operate on him immediately due to recent PCI.  He has not had any further appendicitis-type pain.  However, several weeks ago, he developed profuse diarrhea and was diagnosed with C difficile colitis.  He was started on po vancomycin.  He was not given much direction as to how long to take the vancomycin so stopped it on his own.  He had some recurrent diarrhea earlier this week, so restarted the vancomycin.    He returns today for 2 week follow up with his wife. Entresto and spiro were restarted and 3 day ziopatch was placed. He is doing okay today. Denies SOB, but not very active. He was able to walk around the fair with no SOB, but was very fatigued afterward. Denies edema, orthopnea, or PND. He has not yet heard from cardiac rehab. Denies CP or palpitations. He has rare dizziness with standing. Continues  to use colchicine PRN for gout pain. He has occasional diarrhea and has missed phone calls from ID, but has been trying to get in touch with them. Eats most meals at home and limits salt and fluid intake. Weights stable ~200 lbs.  Ziopatch: 36.5% PVCs. NSR, average HR of 75.   Labs (5/19): K 4.2 => 4.8, creatinine 1.54 => 1.38, hgb 11.7, AST 97, ALT 151 Labs (6/19): K 4.3, creatinine 1.16 => 1.08, LFTs normal Labs (8/19): LDL 29, HDL 51, TGs 53 Labs (9/19): K 3.7, creatinine 1.12  ECG (personally reviewed): NSR, bigeminal PVCs, poor RWP, old inferior MI  PMH: 1. CAD: MI in 2017, per report patient had a total of 5 stents in the RCA system.  - Inferior STEMI 5/19: LHC showed occluded RCA with relatively clear left coronary system. DES to proximal-mid RCA restoring flow to an acute marginal, however, the distal RCA/PLV/PDA remained occluded.  There were collaterals to the PLV from the left.   2. VT: Patient had pulseless VT in setting of inferior STEMI.   3. Chronic systolic CHF: Ischemic cardiomyopathy.   - Echo (5/19): EF 30-35%, inferior WMA.   - Echo (7/19): EF 50-55% 4. CKD: Stage II.  5. Acute appendicitis: Managed medically in 8/19.  6. C difficile diarrhea 8/19-9/19 7. Gout  FH: No premature CAD.   SH: Married with a child.  Works for [employer].  Moved to [place] from [place].  No smoking, rare ETOH.   Review of systems complete and found to be negative unless listed in HPI.   Current Outpatient Medications  Medication Sig Dispense Refill  . aspirin EC 81 MG tablet Take 81 mg by mouth daily.    Marland Kitchen atorvastatin (LIPITOR) 80 MG tablet Take 1 tablet (80 mg total) by mouth daily at 6 PM. 30 tablet 6  . BRILINTA 90 MG TABS tablet Take 1 tablet (90 mg total) by mouth 2 (two) times daily. 60 tablet 6  . carvedilol (COREG) 6.25 MG tablet Take 6.25 mg by mouth 2 (two) times daily with a meal.    . Colchicine 0.6 MG CAPS Take 1 Tablet Twice today, then take daily as long as gout pain last  33 capsule 3  . sacubitril-valsartan (ENTRESTO) 24-26 MG Take 1 tablet by mouth 2 (two) times daily. 60 tablet 3  . spironolactone (ALDACTONE) 25 MG tablet Take 0.5 tablets (12.5 mg total) by mouth daily. 45 tablet 3  . traMADol (ULTRAM) 50 MG tablet Take 1 tablet (50 mg total) by mouth at bedtime as needed for moderate pain. 30 tablet 0  . vancomycin (VANCOCIN) 125 MG capsule Take 1 capsule (125 mg total) by mouth 4 (four) times daily. 40 capsule 0   No current facility-administered medications for this encounter.    BP 126/72   Pulse 63   Wt 92.9 kg (204 lb 12.8 oz)   SpO2 100%   BMI 30.24 kg/m  General: Well appearing. No resp difficulty. HEENT: Normal Neck: Supple. JVP flat. Carotids 2+ bilat; no bruits. No thyromegaly or nodule noted. Cor: PMI nondisplaced. Irregular with PVCs, No M/G/R noted Lungs: CTAB, normal effort. Abdomen: Soft, non-tender, non-distended, no HSM. No bruits or masses. +BS  Extremities: No cyanosis, clubbing, or rash. R and LLE no edema.  Neuro: Alert & orientedx3, cranial nerves grossly intact. moves all 4 extremities w/o difficulty. Affect pleasant  EKG: NSR 63 bpm with frequent PVCs (trigeminy). Personally reviewed.   Assessment/Plan: 1. CAD: Initial MI in 2017, 5 stents to RCA system.  Inferior MI in 5/19, DES to proximal/mid RCA but distal RCA and PLV/PDA remained occluded.  No good options for opening. Left coronary system had minimal disease. No s/s ischemia.  - Continue ASA 81 daily and ticagrelor 90 bid.  - Continue atorvastatin 80 mg daily, good lipids in 8/19.   - Referred to cardiac rehab last visit.  - Set up for ETT with frequent PVCs 2. Chronic systolic CHF: Ischemic cardiomyopathy.  NYHA class II, volume status stable on exam.  Echo in 7/19 showed EF improved to 50-55%.   - Does not need lasix - Continue Entresto 24/26 bid.  - Continue Coreg 6.25 mg bid.   - Continue spironolactone at 12.5 mg daily. BMET today. - EF is out of range for  ICD.  - Repeat echo with frequent PVCs to make sure that EF has not dropped.  3. VT: Pulseless VT with CPR peri-MI.  He underwent hypothermia protocol.  EF has recovered to 50-55%.  As above, repeat echo. 4. PVCs: Trigeminal PVCs on ECG today.   - He wore 3 day Zio Patch to quantify PVCs, look for VT. It showed 36% PVC burden. - Start amio 200 mg BID and refer to EP. Would not want to keep him on amio long term. - Repeat echo and ETT as above.  5. Suspected gout: Pain/swelling left 1st MTP, suspect gout.  - Uric acid  9.0. Continue colchicine for now.  - Will likely eventually start him on allopurinol.  6. Appendicitis: Treated medically and resolved.  Has followup with [facility].  7. C difficile diarrhea: Had a relapse earlier in the week, restart po vancomycin and has resolved.  - Continue vancomycin qid.   - He has been calling ID and missing return phone calls, but plans to follow up. 8. Snores - Refer for sleep study  Refer for sleep study Start amio 200 mg BID BMET, mag today Echo  ETT Refer to EP Keep f/u in 3 weeks with Dr Herbie Saxon Dwan Bolt 01/12/2018  Greater than 50% of the 25 minute visit was spent in counseling/coordination of care regarding disease state education, salt/fluid restriction, sliding scale diuretics, and medication compliance.

## 2018-01-12 ENCOUNTER — Encounter (HOSPITAL_COMMUNITY): Payer: Self-pay

## 2018-01-12 ENCOUNTER — Ambulatory Visit (HOSPITAL_COMMUNITY)
Admission: RE | Admit: 2018-01-12 | Discharge: 2018-01-12 | Disposition: A | Discharge status: Home / Self Care | Payer: BLUE CROSS/BLUE SHIELD | Source: Ambulatory Visit | Attending: Cardiology | Admitting: Cardiology

## 2018-01-12 ENCOUNTER — Encounter (HOSPITAL_COMMUNITY): Payer: Self-pay | Admitting: *Deleted

## 2018-01-12 VITALS — BP 126/72 | HR 63 | Wt 204.8 lb

## 2018-01-12 DIAGNOSIS — R0683 Snoring: Secondary | ICD-10-CM | POA: Diagnosis not present

## 2018-01-12 DIAGNOSIS — I493 Ventricular premature depolarization: Secondary | ICD-10-CM | POA: Diagnosis not present

## 2018-01-12 DIAGNOSIS — I252 Old myocardial infarction: Secondary | ICD-10-CM | POA: Insufficient documentation

## 2018-01-12 DIAGNOSIS — M109 Gout, unspecified: Secondary | ICD-10-CM | POA: Diagnosis not present

## 2018-01-12 DIAGNOSIS — I5022 Chronic systolic (congestive) heart failure: Secondary | ICD-10-CM | POA: Insufficient documentation

## 2018-01-12 DIAGNOSIS — Z79899 Other long term (current) drug therapy: Secondary | ICD-10-CM | POA: Diagnosis not present

## 2018-01-12 DIAGNOSIS — I255 Ischemic cardiomyopathy: Secondary | ICD-10-CM | POA: Diagnosis not present

## 2018-01-12 DIAGNOSIS — A498 Other bacterial infections of unspecified site: Secondary | ICD-10-CM

## 2018-01-12 DIAGNOSIS — I2510 Atherosclerotic heart disease of native coronary artery without angina pectoris: Secondary | ICD-10-CM | POA: Diagnosis not present

## 2018-01-12 DIAGNOSIS — Z7982 Long term (current) use of aspirin: Secondary | ICD-10-CM | POA: Diagnosis not present

## 2018-01-12 DIAGNOSIS — N182 Chronic kidney disease, stage 2 (mild): Secondary | ICD-10-CM | POA: Insufficient documentation

## 2018-01-12 LAB — BASIC METABOLIC PANEL
ANION GAP: 8 (ref 5–15)
BUN: 12 mg/dL (ref 6–20)
CALCIUM: 9.6 mg/dL (ref 8.9–10.3)
CO2: 25 mmol/L (ref 22–32)
CREATININE: 1.16 mg/dL (ref 0.61–1.24)
Chloride: 107 mmol/L (ref 98–111)
Glucose, Bld: 99 mg/dL (ref 70–99)
Potassium: 4.5 mmol/L (ref 3.5–5.1)
Sodium: 140 mmol/L (ref 135–145)

## 2018-01-12 LAB — MAGNESIUM: MAGNESIUM: 1.9 mg/dL (ref 1.7–2.4)

## 2018-01-12 MED ORDER — AMIODARONE HCL 200 MG PO TABS: 200.0000 mg | ORAL_TABLET | Freq: Two times a day (BID) | ORAL | 3 refills | Status: DC

## 2018-01-12 NOTE — Patient Instructions (Addendum)
START Amiodarone 200mg  twice daily.  Routine lab work today. Will notify you of abnormal results  You have been referred to Cardiac Rehab.  (they will contact you to schedule appointment)  You have been referred to electrophysiology (EP). (they will contact you to schedule appointment)  Your provider requests you have a sleep study.  (they will contact you to schedule appointment)  Your provider requests you have an echocardiogram and exercise tolerance test.   Please keep your follow up appointment with Dr.McLean on 01/31/2018.

## 2018-01-12 NOTE — Progress Notes (Signed)
Patient completed SDOH screening- no concerns identified at this time.  CSW will continue to follow and assist as needed  Burna Sis, LCSW Clinical Social Worker [phone]

## 2018-01-12 NOTE — Addendum Note (Signed)
Encounter addended by: Alford Highland, NP on: 01/12/2018 12:51 PM  Actions taken: Sign clinical note

## 2018-01-13 ENCOUNTER — Ambulatory Visit (HOSPITAL_COMMUNITY): Payer: BLUE CROSS/BLUE SHIELD

## 2018-01-16 ENCOUNTER — Ambulatory Visit (HOSPITAL_COMMUNITY): Payer: BLUE CROSS/BLUE SHIELD

## 2018-01-16 ENCOUNTER — Ambulatory Visit (HOSPITAL_COMMUNITY)
Admission: RE | Admit: 2018-01-16 | Discharge: 2018-01-16 | Disposition: A | Discharge status: Home / Self Care | Payer: BLUE CROSS/BLUE SHIELD | Source: Ambulatory Visit | Attending: Cardiovascular Disease | Admitting: Cardiovascular Disease

## 2018-01-16 DIAGNOSIS — I081 Rheumatic disorders of both mitral and tricuspid valves: Secondary | ICD-10-CM | POA: Diagnosis not present

## 2018-01-16 DIAGNOSIS — I252 Old myocardial infarction: Secondary | ICD-10-CM | POA: Diagnosis not present

## 2018-01-16 DIAGNOSIS — I2510 Atherosclerotic heart disease of native coronary artery without angina pectoris: Secondary | ICD-10-CM | POA: Diagnosis not present

## 2018-01-16 DIAGNOSIS — I255 Ischemic cardiomyopathy: Secondary | ICD-10-CM

## 2018-01-16 DIAGNOSIS — I493 Ventricular premature depolarization: Secondary | ICD-10-CM | POA: Insufficient documentation

## 2018-01-16 DIAGNOSIS — I509 Heart failure, unspecified: Secondary | ICD-10-CM | POA: Insufficient documentation

## 2018-01-16 NOTE — Progress Notes (Signed)
  Echocardiogram 2D Echocardiogram has been performed.  Lee Moore 01/16/2018, 9:15 AM

## 2018-01-17 ENCOUNTER — Telehealth (HOSPITAL_COMMUNITY): Payer: Self-pay | Admitting: *Deleted

## 2018-01-17 NOTE — Telephone Encounter (Signed)
Attempted to leave a message on voicemail in reference to upcoming appointment scheduled for 01/23/18 but no answer and mailbox full. Harla Mensch, Adelene Idler

## 2018-01-18 ENCOUNTER — Ambulatory Visit (HOSPITAL_COMMUNITY): Payer: BLUE CROSS/BLUE SHIELD

## 2018-01-18 ENCOUNTER — Telehealth (HOSPITAL_COMMUNITY): Payer: Self-pay

## 2018-01-18 NOTE — Telephone Encounter (Signed)
Pt called sent to voice mail, mail box was full will try and call pt back EF 45-50% with frequent PVCs noted.  EF has not fallen significantly.

## 2018-01-20 ENCOUNTER — Ambulatory Visit (HOSPITAL_COMMUNITY): Payer: BLUE CROSS/BLUE SHIELD

## 2018-01-23 ENCOUNTER — Ambulatory Visit (HOSPITAL_COMMUNITY): Payer: BLUE CROSS/BLUE SHIELD

## 2018-01-23 ENCOUNTER — Encounter (HOSPITAL_COMMUNITY): Payer: BLUE CROSS/BLUE SHIELD

## 2018-01-24 ENCOUNTER — Encounter: Payer: Self-pay | Admitting: Cardiology

## 2018-01-24 ENCOUNTER — Ambulatory Visit (HOSPITAL_COMMUNITY)
Admission: RE | Admit: 2018-01-24 | Discharge: 2018-01-24 | Disposition: A | Discharge status: Home / Self Care | Payer: BLUE CROSS/BLUE SHIELD | Source: Ambulatory Visit | Attending: Cardiology | Admitting: Cardiology

## 2018-01-24 ENCOUNTER — Ambulatory Visit: Payer: BLUE CROSS/BLUE SHIELD | Admitting: Cardiology

## 2018-01-24 VITALS — BP 112/70 | HR 64 | Ht 69.0 in | Wt 203.4 lb

## 2018-01-24 DIAGNOSIS — I5022 Chronic systolic (congestive) heart failure: Secondary | ICD-10-CM

## 2018-01-24 DIAGNOSIS — I2510 Atherosclerotic heart disease of native coronary artery without angina pectoris: Secondary | ICD-10-CM | POA: Diagnosis not present

## 2018-01-24 DIAGNOSIS — I255 Ischemic cardiomyopathy: Secondary | ICD-10-CM

## 2018-01-24 DIAGNOSIS — I493 Ventricular premature depolarization: Secondary | ICD-10-CM | POA: Diagnosis not present

## 2018-01-24 NOTE — Patient Instructions (Signed)
Medication Instructions:  Your physician recommends that you continue on your current medications as directed. Please refer to the Current Medication list given to you today.  If you need a refill on your cardiac medications before your next appointment, please call your pharmacy.   Lab work: None ordered  Testing/Procedures: None ordered  Follow-Up: At [facility], you and your health needs are our priority.  As part of our continuing mission to provide you with exceptional heart care, we have created designated Provider Care Teams.  These Care Teams include your primary Cardiologist (physician) and Advanced Practice Providers (APPs -  Physician Assistants and Nurse Practitioners) who all work together to provide you with the care you need, when you need it. You will need a follow up appointment in 3 months.  Please call our office 2 months in advance to schedule this appointment.  You may see Dr. Elberta Fortis or one of the following Advanced Practice Providers on your designated Care Team:   Gypsy Balsam, NP . [place] Dowse, PA-C  Thank you for choosing [facility]!!   Dory Horn, RN [phone]  Any Other Special Instructions Will Be Listed Below (If Applicable).  Cardiac Ablation Cardiac ablation is a procedure to disable (ablate) a small amount of heart tissue in very specific places. The heart has many electrical connections. Sometimes these connections are abnormal and can cause the heart to beat very fast or irregularly. Ablating some of the problem areas can improve the heart rhythm or return it to normal. Ablation may be done for people who:  Have Wolff-Parkinson-White syndrome.  Have fast heart rhythms (tachycardia).  Have taken medicines for an abnormal heart rhythm (arrhythmia) that were not effective or caused side effects.  Have a high-risk heartbeat that may be life-threatening.  During the procedure, a small incision is made in the neck or the groin, and a  long, thin, flexible tube (catheter) is inserted into the incision and moved to the heart. Small devices (electrodes) on the tip of the catheter will send out electrical currents. A type of X-ray (fluoroscopy) will be used to help guide the catheter and to provide images of the heart. Tell a health care provider about:  Any allergies you have.  All medicines you are taking, including vitamins, herbs, eye drops, creams, and over-the-counter medicines.  Any problems you or family members have had with anesthetic medicines.  Any blood disorders you have.  Any surgeries you have had.  Any medical conditions you have, such as kidney failure.  Whether you are pregnant or may be pregnant. What are the risks? Generally, this is a safe procedure. However, problems may occur, including:  Infection.  Bruising and bleeding at the catheter insertion site.  Bleeding into the chest, especially into the sac that surrounds the heart. This is a serious complication.  Stroke or blood clots.  Damage to other structures or organs.  Allergic reaction to medicines or dyes.  Need for a permanent pacemaker if the normal electrical system is damaged. A pacemaker is a small computer that sends electrical signals to the heart and helps your heart beat normally.  The procedure not being fully effective. This may not be recognized until months later. Repeat ablation procedures are sometimes required.  What happens before the procedure?  Follow instructions from your health care provider about eating or drinking restrictions.  Ask your health care provider about: ? Changing or stopping your regular medicines. This is especially important if you are taking diabetes medicines or  blood thinners. ? Taking medicines such as aspirin and ibuprofen. These medicines can thin your blood. Do not take these medicines before your procedure if your health care provider instructs you not to.  Plan to have someone take  you home from the hospital or clinic.  If you will be going home right after the procedure, plan to have someone with you for 24 hours. What happens during the procedure?  To lower your risk of infection: ? Your health care team will wash or sanitize their hands. ? Your skin will be washed with soap. ? Hair may be removed from the incision area.  An IV tube will be inserted into one of your veins.  You will be given a medicine to help you relax (sedative).  The skin on your neck or groin will be numbed.  An incision will be made in your neck or your groin.  A needle will be inserted through the incision and into a large vein in your neck or groin.  A catheter will be inserted into the needle and moved to your heart.  Dye may be injected through the catheter to help your surgeon see the area of the heart that needs treatment.  Electrical currents will be sent from the catheter to ablate heart tissue in desired areas. There are three types of energy that may be used to ablate heart tissue: ? Heat (radiofrequency energy). ? Laser energy. ? Extreme cold (cryoablation).  When the necessary tissue has been ablated, the catheter will be removed.  Pressure will be held on the catheter insertion area to prevent excessive bleeding.  A bandage (dressing) will be placed over the catheter insertion area. The procedure may vary among health care providers and hospitals. What happens after the procedure?  Your blood pressure, heart rate, breathing rate, and blood oxygen level will be monitored until the medicines you were given have worn off.  Your catheter insertion area will be monitored for bleeding. You will need to lie still for a few hours to ensure that you do not bleed from the catheter insertion area.  Do not drive for 24 hours or as long as directed by your health care provider. Summary  Cardiac ablation is a procedure to disable (ablate) a small amount of heart tissue in very  specific places. Ablating some of the problem areas can improve the heart rhythm or return it to normal.  During the procedure, electrical currents will be sent from the catheter to ablate heart tissue in desired areas. This information is not intended to replace advice given to you by your health care provider. Make sure you discuss any questions you have with your health care provider. Document Released: 08/01/2008 Document Revised: 02/02/2016 Document Reviewed: 02/02/2016 Elsevier Interactive Patient Education  [employer].

## 2018-01-24 NOTE — Progress Notes (Signed)
Electrophysiology Office Note   Date:  01/24/2018   ID:  Lee Moore, DOB 09-27-1975, MRN [id number]  PCP:  Patient, No Pcp Per  Cardiologist:  Shirlee Latch Primary Electrophysiologist:  Patsye Sullivant Jorja Loa, MD    No chief complaint on file.    History of Present Illness: Lee Moore is a 42 y.o. male who is being seen today for the evaluation of PVCs at the request of Duwaine Maxin. Presenting today for electrophysiology evaluation.  He has a history of coronary disease status post MI in 2017 and again in 07/2017.  He also has systolic heart failure.  In 2017, he is RCA was stented in [place].  07/2017 he had an inferior STEMI complicated by multiple episodes of pulseless VT.  He had CPR and ultimately went to the Cath Lab which showed a proximal RCA occlusion but minimal left-sided disease.  His ejection fraction at the time was 30 to 35% and he was discharged with a LifeVest.  Repeat echo shows an EF of 50 to 55%.  He subsequently wore a ZIO patch that showed 36% PVCs.  He has been having some shortness of breath and fatigue towards the end of the day.  He thinks that this might be due to his PVCs.  Today, he denies symptoms of palpitations, chest pain, shortness of breath, orthopnea, PND, lower extremity edema, claudication, dizziness, presyncope, syncope, bleeding, or neurologic sequela. The patient is tolerating medications without difficulties.    Past Medical History:  Diagnosis Date  . MI (myocardial infarction) [facility])    Past Surgical History:  Procedure Laterality Date  . CORONARY/GRAFT ACUTE MI REVASCULARIZATION N/A 07/30/2017   Procedure: Coronary/Graft Acute MI Revascularization;  Surgeon: Rinaldo Cloud, MD;  Location: [facility] INVASIVE CV LAB;  Service: Cardiovascular;  Laterality: N/A;  . LEFT HEART CATH AND CORONARY ANGIOGRAPHY N/A 07/30/2017   Procedure: LEFT HEART CATH AND CORONARY ANGIOGRAPHY;  Surgeon: Rinaldo Cloud, MD;  Location: [facility] INVASIVE CV LAB;  Service: Cardiovascular;   Laterality: N/A;     Current Outpatient Medications  Medication Sig Dispense Refill  . amiodarone (PACERONE) 200 MG tablet Take 1 tablet (200 mg total) by mouth 2 (two) times daily. 60 tablet 3  . aspirin EC 81 MG tablet Take 81 mg by mouth daily.    Marland Kitchen atorvastatin (LIPITOR) 80 MG tablet Take 1 tablet (80 mg total) by mouth daily at 6 PM. 30 tablet 6  . BRILINTA 90 MG TABS tablet Take 1 tablet (90 mg total) by mouth 2 (two) times daily. 60 tablet 6  . carvedilol (COREG) 6.25 MG tablet Take 6.25 mg by mouth 2 (two) times daily with a meal.    . Colchicine 0.6 MG CAPS Take 1 Tablet Twice today, then take daily as long as gout pain last 33 capsule 3  . sacubitril-valsartan (ENTRESTO) 24-26 MG Take 1 tablet by mouth 2 (two) times daily. 60 tablet 3  . spironolactone (ALDACTONE) 25 MG tablet Take 0.5 tablets (12.5 mg total) by mouth daily. 45 tablet 3  . traMADol (ULTRAM) 50 MG tablet Take 1 tablet (50 mg total) by mouth at bedtime as needed for moderate pain. 30 tablet 0  . vancomycin (VANCOCIN) 125 MG capsule Take 1 capsule (125 mg total) by mouth 4 (four) times daily. 40 capsule 0   No current facility-administered medications for this visit.     Allergies:   Patient has no known allergies.   Social History:  The patient  reports that he has never smoked. He has  never used smokeless tobacco. He reports that he drank alcohol. He reports that he does not use drugs.   Family History:  The patient's family history is not on file.    ROS:  Please see the history of present illness.   Otherwise, review of systems is positive for none.   All other systems are reviewed and negative.    PHYSICAL EXAM: VS:  BP 112/70   Pulse 64   Ht 5\' 9"  (1.753 m)   Wt 203 lb 6.4 oz (92.3 kg)   SpO2 98%   BMI 30.04 kg/m  , BMI Body mass index is 30.04 kg/m. GEN: Well nourished, well developed, in no acute distress  HEENT: normal  Neck: no JVD, carotid bruits, or masses Cardiac: RRR; no murmurs, rubs,  or gallops,no edema  Respiratory:  clear to auscultation bilaterally, normal work of breathing GI: soft, nontender, nondistended, + BS MS: no deformity or atrophy  Skin: warm and dry Neuro:  Strength and sensation are intact Psych: euthymic mood, full affect  EKG:  EKG is not ordered today. Personal review of the ekg ordered 01/12/18 shows sinus rhythm, ventricular trigeminy  Recent Labs: 08/31/2017: TSH 1.858 11/11/2017: ALT 26 12/05/2017: Hemoglobin 13.1; Platelets 333 01/12/2018: BUN 12; Creatinine, Ser 1.16; Magnesium 1.9; Potassium 4.5; Sodium 140    Lipid Panel     Component Value Date/Time   CHOL 91 10/28/2017 0934   TRIG 53 10/28/2017 0934   HDL 51 10/28/2017 0934   CHOLHDL 1.8 10/28/2017 0934   VLDL 11 10/28/2017 0934   LDLCALC 29 10/28/2017 0934     Wt Readings from Last 3 Encounters:  01/24/18 203 lb 6.4 oz (92.3 kg)  01/12/18 204 lb 12.8 oz (92.9 kg)  12/22/17 205 lb 9.6 oz (93.3 kg)      Other studies Reviewed: Additional studies/ records that were reviewed today include: TTE 01/16/18  Review of the above records today demonstrates:  - Left ventricle: The cavity size was normal. There was mild   concentric hypertrophy. Systolic function was mildly reduced. The   estimated ejection fraction was in the range of 45% to 50%.   Hypokinesis of the basal-midinferior and inferoseptal myocardium.   Doppler parameters are consistent with abnormal left ventricular   relaxation (grade 1 diastolic dysfunction). - Aortic valve: Transvalvular velocity was within the normal range.   There was no stenosis. There was no regurgitation. - Mitral valve: Transvalvular velocity was within the normal range.   There was no evidence for stenosis. There was mild regurgitation. - Right ventricle: The cavity size was normal. Wall thickness was   normal. Systolic function was normal. - Right atrium: The atrium was severely dilated. - Atrial septum: No defect or patent foramen ovale was  identified   by color flow Doppler. - Tricuspid valve: There was mild regurgitation. - Pulmonary arteries: Systolic pressure was within the normal   range. PA peak pressure: 24 mm Hg (S). - Global lonitudinal strain -10.6% (abnormal). However, PVCs were   noted as the images were acquired making the output less   accurate.  Monitor 01/12/18 - personally reviewed 1. Predominantly sinus.  2. Very frequent PVCs, 36.5% total beats. No VT.   ASSESSMENT AND PLAN:  1.  Chronic systolic heart failure due to ischemic cardiomyopathy: Currently on optimal medical therapy with Entresto, Coreg, and Aldactone.  Repeat echo shows an ejection fraction that is improved to 50 to 55%.  He has class II symptoms.  2.  PVCs: Wore a ZIO  patch that showed frequent PVCs up to 36%.  He is currently on amiodarone.  I would prefer to have him off of amiodarone as he is quite young.  I talked to him about the possibility of starting mexiletine versus ablation.  Risks and benefits of ablation were discussed and include bleeding, tamponade, heart block, stroke.  He understands these risks and would like to think about it and Alayjah Boehringer discuss it with his family.  He Nicholis Stepanek call us back with an answer.  3.  Coronary artery disease status post MI: Multiple stents in the RCA system.  Currently in cardiac rehab.  Plan per heart failure cardiology.    Current medicines are reviewed at length with the patient today.   The patient does not have concerns regarding his medicines.  The following changes were made today:  none  Labs/ tests ordered today include:  No orders of the defined types were placed in this encounter.  Case discussed with primary cardiology  Disposition:   FU with Sukaina Toothaker 3 months  Signed, [facility] Jorja Loa, MD  01/24/2018 4:07 PM     [facility] [address] [place] [address] [phone] (office) [phone] (fax)

## 2018-01-25 ENCOUNTER — Telehealth (HOSPITAL_COMMUNITY): Payer: Self-pay

## 2018-01-25 ENCOUNTER — Ambulatory Visit (HOSPITAL_COMMUNITY): Payer: BLUE CROSS/BLUE SHIELD

## 2018-01-25 ENCOUNTER — Ambulatory Visit (HOSPITAL_COMMUNITY)
Admission: RE | Admit: 2018-01-25 | Discharge: 2018-01-25 | Disposition: A | Discharge status: Home / Self Care | Payer: BLUE CROSS/BLUE SHIELD | Source: Ambulatory Visit | Attending: Cardiology | Admitting: Cardiology

## 2018-01-25 DIAGNOSIS — I255 Ischemic cardiomyopathy: Secondary | ICD-10-CM

## 2018-01-25 LAB — MYOCARDIAL PERFUSION IMAGING
CHL CUP NUCLEAR SDS: 4
CHL CUP NUCLEAR SSS: 16
CHL CUP RESTING HR STRESS: 54 {beats}/min
NUC STRESS TID: 1.06
Peak HR: 166 {beats}/min
SRS: 12

## 2018-01-25 MED ORDER — TECHNETIUM TC 99M TETROFOSMIN IV KIT
28.0000 | PACK | Freq: Once | INTRAVENOUS | Status: AC
  Administered 2018-01-25: 28 via INTRAVENOUS
  Filled 2018-01-25: qty 28

## 2018-01-25 MED ORDER — REGADENOSON 0.4 MG/5ML IV SOLN
0.4000 mg | Freq: Once | INTRAVENOUS | Status: AC
  Administered 2018-01-25: 0.4 mg via INTRAVENOUS

## 2018-01-25 MED ORDER — TECHNETIUM TC 99M TETROFOSMIN IV KIT
9.1000 | PACK | Freq: Once | INTRAVENOUS | Status: AC | PRN
  Administered 2018-01-25: 9.1 via INTRAVENOUS
  Filled 2018-01-25: qty 10

## 2018-01-25 NOTE — Telephone Encounter (Signed)
Pt called sent to voice mail, mail box was full will try and call pt back EF 45-50% with frequent PVCs noted.  EF has not fallen significantly. 

## 2018-01-27 ENCOUNTER — Ambulatory Visit (HOSPITAL_COMMUNITY): Payer: BLUE CROSS/BLUE SHIELD

## 2018-01-27 ENCOUNTER — Telehealth (HOSPITAL_COMMUNITY): Payer: Self-pay

## 2018-01-27 NOTE — Telephone Encounter (Signed)
Pt called with results Prior MI with no ischemia.  Would not do cath at this time. EF 45-50% with frequent PVCs noted. EF has not fallen significantly.  Verbalized understanding pt has an appointment 11/5

## 2018-01-30 ENCOUNTER — Ambulatory Visit (HOSPITAL_COMMUNITY): Payer: BLUE CROSS/BLUE SHIELD

## 2018-01-31 ENCOUNTER — Encounter (HOSPITAL_COMMUNITY): Payer: Self-pay | Admitting: *Deleted

## 2018-01-31 ENCOUNTER — Ambulatory Visit (HOSPITAL_COMMUNITY)
Admission: RE | Admit: 2018-01-31 | Discharge: 2018-01-31 | Disposition: A | Discharge status: Home / Self Care | Payer: BLUE CROSS/BLUE SHIELD | Source: Ambulatory Visit | Attending: Cardiology | Admitting: Cardiology

## 2018-01-31 VITALS — BP 112/66 | HR 66 | Wt 207.2 lb

## 2018-01-31 DIAGNOSIS — M109 Gout, unspecified: Secondary | ICD-10-CM | POA: Diagnosis not present

## 2018-01-31 DIAGNOSIS — I252 Old myocardial infarction: Secondary | ICD-10-CM | POA: Diagnosis not present

## 2018-01-31 DIAGNOSIS — N182 Chronic kidney disease, stage 2 (mild): Secondary | ICD-10-CM | POA: Diagnosis not present

## 2018-01-31 DIAGNOSIS — I5022 Chronic systolic (congestive) heart failure: Secondary | ICD-10-CM | POA: Diagnosis not present

## 2018-01-31 DIAGNOSIS — I509 Heart failure, unspecified: Secondary | ICD-10-CM | POA: Diagnosis not present

## 2018-01-31 DIAGNOSIS — Z79899 Other long term (current) drug therapy: Secondary | ICD-10-CM | POA: Diagnosis not present

## 2018-01-31 DIAGNOSIS — I2510 Atherosclerotic heart disease of native coronary artery without angina pectoris: Secondary | ICD-10-CM | POA: Diagnosis not present

## 2018-01-31 DIAGNOSIS — I493 Ventricular premature depolarization: Secondary | ICD-10-CM | POA: Diagnosis not present

## 2018-01-31 DIAGNOSIS — Z7982 Long term (current) use of aspirin: Secondary | ICD-10-CM | POA: Insufficient documentation

## 2018-01-31 DIAGNOSIS — I10 Essential (primary) hypertension: Secondary | ICD-10-CM

## 2018-01-31 DIAGNOSIS — I255 Ischemic cardiomyopathy: Secondary | ICD-10-CM | POA: Diagnosis not present

## 2018-01-31 LAB — COMPREHENSIVE METABOLIC PANEL
ALT: 30 U/L (ref 0–44)
AST: 26 U/L (ref 15–41)
Albumin: 4.2 g/dL (ref 3.5–5.0)
Alkaline Phosphatase: 56 U/L (ref 38–126)
Anion gap: 8 (ref 5–15)
BILIRUBIN TOTAL: 1 mg/dL (ref 0.3–1.2)
BUN: 10 mg/dL (ref 6–20)
CO2: 20 mmol/L — ABNORMAL LOW (ref 22–32)
CREATININE: 1.01 mg/dL (ref 0.61–1.24)
Calcium: 9.2 mg/dL (ref 8.9–10.3)
Chloride: 109 mmol/L (ref 98–111)
GFR calc Af Amer: >60 mL/min (ref >60)
Glucose, Bld: 109 mg/dL — ABNORMAL HIGH (ref 70–99)
POTASSIUM: 4.7 mmol/L (ref 3.5–5.1)
Sodium: 137 mmol/L (ref 135–145)
TOTAL PROTEIN: 7 g/dL (ref 6.5–8.1)

## 2018-01-31 LAB — TSH: TSH: 1.32 u[IU]/mL (ref 0.350–4.500)

## 2018-01-31 MED ORDER — AMIODARONE HCL 200 MG PO TABS: 200.0000 mg | ORAL_TABLET | Freq: Every day | ORAL | 3 refills | Status: DC

## 2018-01-31 MED ORDER — ALLOPURINOL 100 MG PO TABS: 100.0000 mg | ORAL_TABLET | Freq: Every day | ORAL | 3 refills | Status: DC

## 2018-01-31 MED ORDER — COLCHICINE 0.6 MG PO CAPS: 0.6000 mg | ORAL_CAPSULE | Freq: Every day | ORAL | 0 refills | Status: DC

## 2018-01-31 MED ORDER — CARVEDILOL 6.25 MG PO TABS: 9.3750 mg | ORAL_TABLET | Freq: Two times a day (BID) | ORAL | 3 refills | Status: DC

## 2018-01-31 NOTE — Patient Instructions (Addendum)
Labs done today  DECREASE amiodarone 200mg  (1 tab) DAILY  INCREASE carvedilol to 9.375mg  (1.5 tabs) twice daily  START allopurinol 100mg  (1 tab) daily  START colchicine 0.6mg  (1 tab) daily WITH allopurinol for ONLY 5 days  You have been referred to Pulmonary Rehab. They will contact you to schedule an appointment.  Follow up with Dr. Shirlee Latch in 6 weeks

## 2018-01-31 NOTE — Progress Notes (Signed)
[facility] Cardiology: Dr. Shirlee Latch  42 yo with history of CAD s/p MI in 2017 and again in 5/19 presents for followup of CHF and CAD.  Patient had initial MI in [place] in 2017, he reports a total of 5 stents to the RCA system. He recently moved to [place] with work.  In 5/19, he had inferior STEMI complicated by multiple episodes of pulseless VT.  He had CPR and ultimately when to the cath lab.  This showed occluded proximal RCA but minimal disease on the left.  The proximal to mid RCA were stented, restoring flow to an acute marginal.  However, the distal RCA, PLV and PDA remained occluded. There were collaterals to the PLV from the left suggesting pre-existing disease.  He underwent hypothermia protocol and had full neurologic recovery from arrest.  Echo in 5/19 showed EF 30-35%.  Cardiac medications were titrated up and he was discharged with a Lifevest.    Repeat echo in 7/19 showed EF improved to 50-55%.    In 8/19, he was admitted with acute appendicitis.  He was managed medically with antibiotics.  Pain resolved, and it was decided not to operate on him immediately due to recent PCI.  He has not had any further appendicitis-type pain.  However,after this, he developed profuse diarrhea and was diagnosed with C difficile colitis.  He was treated with po vancomycin with resolution.     At prior appointment, he was noted to have frequent PVCs. Zio patch showed 36% PVCs.  Echo was done in 10/19, showing EF 45-50%, inferior hypokinesis.  Lexiscan Cardiolite was done, showing fixed inferior defect, no ischemia.  I started him on amiodarone and sent him to see EP, who talked with him about option of PVC ablation.   He returns for followup of CAD, PVCs, and ischemic cardiomyopathy. He has been getting stronger.  Decreased palpitations now that he has started amiodarone. No chest pain.  No exertional dyspnea.  No lightheadedness/syncope.  No abdominal pain or diarrhea. He has been working some, not full time. He  has noted the development of tingling in his fingers again on amiodarone (had this with amiodarone use after last MI.    Labs (5/19): K 4.2 => 4.8, creatinine 1.54 => 1.38, hgb 11.7, AST 97, ALT 151 Labs (6/19): K 4.3, creatinine 1.16 => 1.08, LFTs normal Labs (8/19): LDL 29, HDL 51, TGs 53 Labs (9/19): K 3.7, creatinine 1.12 Labs (10/19): Mg 1.9, K 4.5, creatinine 1.16, uric acid 9  PMH: 1. CAD: MI in 2017, per report patient had a total of 5 stents in the RCA system.  - Inferior STEMI 5/19: LHC showed occluded RCA with relatively clear left coronary system. DES to proximal-mid RCA restoring flow to an acute marginal, however, the distal RCA/PLV/PDA remained occluded.  There were collaterals to the PLV from the left.   - Lexiscan Cardiolite (10/19): Fixed inferior defect, no ischemia.  2. VT: Patient had pulseless VT in setting of inferior STEMI.   3. Chronic systolic CHF: Ischemic cardiomyopathy.   - Echo (5/19): EF 30-35%, inferior WMA.   - Echo (7/19): EF 50-55% - Echo (10/19): EF 45-50%, mild LVH, inferior hypokinesis, mild MR, normal RV size and systolic function.  4. CKD: Stage II.  5. Acute appendicitis: Managed medically in 8/19.  6. C difficile diarrhea 8/19-9/19 7. Gout 8. PVCs: Zio Patch in 10/19 with 36% PVCs.   FH: No premature CAD.   SH: Married with a child.  Works for [employer].  Moved to  [place] from [place].  No smoking, rare ETOH.   ROS: All systems reviewed and negative except as per HPI.   Current Outpatient Medications  Medication Sig Dispense Refill  . amiodarone (PACERONE) 200 MG tablet Take 1 tablet (200 mg total) by mouth daily. 60 tablet 3  . aspirin EC 81 MG tablet Take 81 mg by mouth daily.    Marland Kitchen atorvastatin (LIPITOR) 80 MG tablet Take 1 tablet (80 mg total) by mouth daily at 6 PM. 30 tablet 6  . carvedilol (COREG) 6.25 MG tablet Take 1.5 tablets (9.375 mg total) by mouth 2 (two) times daily with a meal. 90 tablet 3  . Colchicine 0.6 MG CAPS Take 0.6 mg  by mouth daily for 5 days. 5 capsule 0  . sacubitril-valsartan (ENTRESTO) 24-26 MG Take 1 tablet by mouth 2 (two) times daily. 60 tablet 3  . spironolactone (ALDACTONE) 25 MG tablet Take 0.5 tablets (12.5 mg total) by mouth daily. 45 tablet 3  . traMADol (ULTRAM) 50 MG tablet Take 1 tablet (50 mg total) by mouth at bedtime as needed for moderate pain. 30 tablet 0  . allopurinol (ZYLOPRIM) 100 MG tablet Take 1 tablet (100 mg total) by mouth daily. 30 tablet 3   No current facility-administered medications for this encounter.    BP 112/66   Pulse 66   Wt 94 kg (207 lb 3.2 oz)   SpO2 98%   BMI 30.60 kg/m  General: NAD Neck: No JVD, no thyromegaly or thyroid nodule.  Lungs: Clear to auscultation bilaterally with normal respiratory effort. CV: Nondisplaced PMI.  Heart regular S1/S2, no S3/S4, no murmur.  No peripheral edema.  No carotid bruit.  Normal pedal pulses.  Abdomen: Soft, nontender, no hepatosplenomegaly, no distention.  Skin: Intact without lesions or rashes.  Neurologic: Alert and oriented x 3.  Psych: Normal affect. Extremities: No clubbing or cyanosis.  HEENT: Normal.   Assessment/Plan: 1. CAD: Initial MI in 2017, 5 stents to RCA system.  Inferior MI in 5/19, DES to proximal/mid RCA but distal RCA and PLV/PDA remained occluded.  No good options for opening. Left coronary system had minimal disease. No ischemic chest pain.  Lexiscan Cardiolite in 10/19 with inferior scar, no ischemia.  - Continue ASA 81 daily and ticagrelor 90 bid.  - Continue atorvastatin 80 mg daily, good lipids in 8/19.   - He has not heard from cardiac rehab, will refer again.  2. Chronic systolic CHF: Ischemic cardiomyopathy.  NYHA class II, he is not volume overloaded on exam.  Echo in 10/19 showed EF 45-50%.   - Continue Entresto 24/26 bid.  - Increase Coreg to 9.375 mg bid.   - Continue spironolactone 12.5 mg daily.  - Not volume overloaded, no need for Lasix. - BMET today.  - EF is out of range for  ICD.  3. VT: Pulseless VT with CPR peri-MI.  He underwent hypothermia protocol.  EF has recovered to 45-50%.   4. 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  Echo did not show significant change in LV systolic function and Cardiolite did not show ischemia.  Amiodarone was restarted.  He saw EP and PVC ablation was offered.  - I would like to get him off amiodarone eventually.  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  Options would be PVC ablation versus transition from amiodarone to mexiletine (probably not as effective but less side effects).  He is interested in ablation and will re-contact Dr.  Camnitz to set up.   Given current amiodarone use, I will check LFTs and TSH.  5. Gout: Start alllopurinal 100 mg daily today.  Overlap with colchicine for about 5 days then stop colchicine.   6. Appendicitis: Treated medically and resolved.  Has followup with [facility].  7. C difficile diarrhea: resolved.   Followup in 6-8 weeks.   Marca Ancona 01/31/2018

## 2018-02-01 ENCOUNTER — Encounter (HOSPITAL_COMMUNITY): Payer: Self-pay

## 2018-02-01 ENCOUNTER — Telehealth: Payer: Self-pay | Admitting: Cardiology

## 2018-02-01 ENCOUNTER — Ambulatory Visit (HOSPITAL_COMMUNITY): Payer: BLUE CROSS/BLUE SHIELD

## 2018-02-01 ENCOUNTER — Telehealth (HOSPITAL_COMMUNITY): Payer: Self-pay

## 2018-02-01 NOTE — Telephone Encounter (Signed)
Pt insurance is active and benefits verified through [place]. Co-pay $0.00, DED $1,150.00/$1,150.00 met, out of pocket $500.00/$500.00 met, co-insurance 0%. No pre-authorization. Passport, 02/01/18 @ 10:45AM, REF# [id number]

## 2018-02-01 NOTE — Telephone Encounter (Signed)
Patient is calling to schedule his procedure.

## 2018-02-01 NOTE — Telephone Encounter (Signed)
Attempted to call patient in regards to Cardiac Rehab - Unable to [facility] - Mailed letter

## 2018-02-01 NOTE — Telephone Encounter (Signed)
Pt called to report that he is ready to schedule a PVC ablation after talking with Dr. Shirlee Latch yesterday at his appointment..will forward to Dr. Nobie Putnam... Pt requests to have it soon since he will be traveling within a month.. Advised him that we will call him after we advise Dr. Elberta Fortis of his decision.

## 2018-02-02 ENCOUNTER — Other Ambulatory Visit (HOSPITAL_COMMUNITY): Payer: Self-pay

## 2018-02-02 ENCOUNTER — Telehealth (HOSPITAL_COMMUNITY): Payer: Self-pay | Admitting: *Deleted

## 2018-02-02 NOTE — Telephone Encounter (Signed)
Attempted to call pt to verify and mail box was full, will try again later

## 2018-02-02 NOTE — Telephone Encounter (Signed)
-----   Message from Laurey Morale, MD sent at 01/31/2018 10:55 PM EST ----- Please call Lee Moore and make sure that he is taking ticagrelor 90 mg bid.  It was not on his med list today.

## 2018-02-02 NOTE — Telephone Encounter (Signed)
Pt interested in scheduling for 11/15.  He would like to speak with his wife.  Advised that I would hold the spot and call him tomorrow to verify ok to proceed w/ that date. Pt appreciates the help.

## 2018-02-02 NOTE — Telephone Encounter (Signed)
Pt called back. He is taking ticagrelor 90 mg twice daily. I added it to his med list.

## 2018-02-03 ENCOUNTER — Ambulatory Visit (HOSPITAL_COMMUNITY): Payer: BLUE CROSS/BLUE SHIELD

## 2018-02-03 NOTE — Telephone Encounter (Signed)
Scheduled PVC ablation for 11/15. Procedure instructions reviewed w/ pt. Pt aware to arrive at 5:30 am that morning. Pt will stop by office Monday 11/11 for pre procedure lab work. Post procedure follow up scheduled for 12/17. Patient verbalized understanding and agreeable to plan.

## 2018-02-06 ENCOUNTER — Ambulatory Visit (HOSPITAL_COMMUNITY): Payer: BLUE CROSS/BLUE SHIELD

## 2018-02-06 ENCOUNTER — Encounter (HOSPITAL_COMMUNITY): Payer: BLUE CROSS/BLUE SHIELD | Admitting: Cardiology

## 2018-02-08 ENCOUNTER — Encounter (HOSPITAL_COMMUNITY): Payer: Self-pay | Admitting: *Deleted

## 2018-02-08 ENCOUNTER — Ambulatory Visit (HOSPITAL_COMMUNITY): Payer: BLUE CROSS/BLUE SHIELD

## 2018-02-09 ENCOUNTER — Telehealth (HOSPITAL_COMMUNITY): Payer: Self-pay

## 2018-02-09 ENCOUNTER — Encounter (HOSPITAL_COMMUNITY): Payer: Self-pay | Admitting: Anesthesiology

## 2018-02-09 NOTE — Telephone Encounter (Signed)
Called and spoke with patient in regards to Cardiac rehab - patient stated he is interested in the program but is getting ready to begin a conference call. Pt stated he will call back in an hour once call has been completed. If no phone call, will follow up next week.

## 2018-02-09 NOTE — Anesthesia Preprocedure Evaluation (Addendum)
Anesthesia Evaluation  Patient identified by MRN, date of birth, ID band Patient awake    Reviewed: Allergy & Precautions, NPO status , Patient's Chart, lab work & pertinent test results, reviewed documented beta blocker date and time   Airway Mallampati: II  TM Distance: >3 FB Neck ROM: Full    Dental no notable dental hx. (+) Teeth Intact   Pulmonary neg pulmonary ROS,    Pulmonary exam normal breath sounds clear to auscultation       Cardiovascular hypertension, Pt. on medications and Pt. on home beta blockers + Past MI and + Cardiac Stents  + dysrhythmias Ventricular Tachycardia + Valvular Problems/Murmurs MR  Rhythm:Irregular Rate:Normal  MI 2017, 07/2017 Pulseless VT cardiac arrest 5/21019, successfully resuscitated, hypothermia protocol, neurologically intact. Cardiac cath occluded proximal RCA, Stents x 2 Previously had stents x 5 placed RCA [place] 2017. Ischemic CM Latest Echo 12/2017- inferior hypokinesis, LVEF 45-50%% PVC's with increased frequency since last MI, not responding to medical therapy   Neuro/Psych negative neurological ROS  negative psych ROS   GI/Hepatic negative GI ROS, Neg liver ROS,   Endo/Other  Gout Obesity Hyperlipidemia  Renal/GU negative Renal ROS  negative genitourinary   Musculoskeletal negative musculoskeletal ROS (+)   Abdominal (+) + obese,   Peds  Hematology Anticoagulated Brilinta- last dose    Anesthesia Other Findings   Reproductive/Obstetrics                           Anesthesia Physical Anesthesia Plan  ASA: III  Anesthesia Plan: MAC   Post-op Pain Management:    Induction: Intravenous  PONV Risk Score and Plan: 2 and Ondansetron, Midazolam and Treatment may vary due to age or medical condition  Airway Management Planned:   Additional Equipment:   Intra-op Plan:   Post-operative Plan:   Informed Consent: I have reviewed the  patients History and Physical, chart, labs and discussed the procedure including the risks, benefits and alternatives for the proposed anesthesia with the patient or authorized representative who has indicated his/her understanding and acceptance.   Dental advisory given  Plan Discussed with: CRNA and Surgeon  Anesthesia Plan Comments:       Anesthesia Quick Evaluation

## 2018-02-10 ENCOUNTER — Ambulatory Visit (HOSPITAL_COMMUNITY): Payer: BLUE CROSS/BLUE SHIELD | Admitting: Anesthesiology

## 2018-02-10 ENCOUNTER — Ambulatory Visit (HOSPITAL_COMMUNITY)
Admission: RE | Admit: 2018-02-10 | Discharge: 2018-02-10 | Disposition: A | Discharge status: Home / Self Care | Payer: BLUE CROSS/BLUE SHIELD | Source: Ambulatory Visit | Attending: Cardiology | Admitting: Cardiology

## 2018-02-10 ENCOUNTER — Encounter (HOSPITAL_COMMUNITY)
Admission: RE | Disposition: A | Discharge status: Home / Self Care | Payer: Self-pay | Source: Ambulatory Visit | Attending: Cardiology

## 2018-02-10 ENCOUNTER — Other Ambulatory Visit: Payer: Self-pay

## 2018-02-10 ENCOUNTER — Encounter (HOSPITAL_COMMUNITY): Payer: Self-pay | Admitting: Certified Registered Nurse Anesthetist

## 2018-02-10 DIAGNOSIS — I252 Old myocardial infarction: Secondary | ICD-10-CM | POA: Diagnosis not present

## 2018-02-10 DIAGNOSIS — E669 Obesity, unspecified: Secondary | ICD-10-CM | POA: Insufficient documentation

## 2018-02-10 DIAGNOSIS — E785 Hyperlipidemia, unspecified: Secondary | ICD-10-CM | POA: Diagnosis not present

## 2018-02-10 DIAGNOSIS — M109 Gout, unspecified: Secondary | ICD-10-CM | POA: Insufficient documentation

## 2018-02-10 DIAGNOSIS — I493 Ventricular premature depolarization: Secondary | ICD-10-CM | POA: Insufficient documentation

## 2018-02-10 DIAGNOSIS — Z6830 Body mass index (BMI) 30.0-30.9, adult: Secondary | ICD-10-CM | POA: Insufficient documentation

## 2018-02-10 DIAGNOSIS — Z955 Presence of coronary angioplasty implant and graft: Secondary | ICD-10-CM | POA: Diagnosis not present

## 2018-02-10 DIAGNOSIS — I10 Essential (primary) hypertension: Secondary | ICD-10-CM | POA: Diagnosis not present

## 2018-02-10 HISTORY — PX: PVC ABLATION: EP1236

## 2018-02-10 LAB — CBC
HCT: 45.8 % (ref 39.0–52.0)
Hemoglobin: 14.7 g/dL (ref 13.0–17.0)
MCH: 29.6 pg (ref 26.0–34.0)
MCHC: 32.1 g/dL (ref 30.0–36.0)
MCV: 92.3 fL (ref 80.0–100.0)
PLATELETS: 270 10*3/uL (ref 150–400)
RBC: 4.96 MIL/uL (ref 4.22–5.81)
RDW: 14.4 % (ref 11.5–15.5)
WBC: 5.1 10*3/uL (ref 4.0–10.5)
nRBC: 0 % (ref 0.0–0.2)

## 2018-02-10 LAB — POCT ACTIVATED CLOTTING TIME
ACTIVATED CLOTTING TIME: 279 s
Activated Clotting Time: 301 seconds
Activated Clotting Time: 345 seconds
Activated Clotting Time: 125 seconds

## 2018-02-10 SURGERY — PVC ABLATION
Anesthesia: Monitor Anesthesia Care

## 2018-02-10 MED ORDER — SODIUM CHLORIDE 0.9 % IV SOLN
INTRAVENOUS | Status: DC
  Administered 2018-02-10: 07:00:00 via INTRAVENOUS

## 2018-02-10 MED ORDER — PROTAMINE SULFATE 10 MG/ML IV SOLN
INTRAVENOUS | Status: DC | PRN
  Administered 2018-02-10 (×2): 25 mg via INTRAVENOUS

## 2018-02-10 MED ORDER — PHENYLEPHRINE HCL 10 MG/ML IJ SOLN
INTRAMUSCULAR | Status: DC | PRN
  Administered 2018-02-10: 40 ug via INTRAVENOUS
  Administered 2018-02-10: 80 ug via INTRAVENOUS
  Administered 2018-02-10 (×3): 40 ug via INTRAVENOUS

## 2018-02-10 MED ORDER — LIDOCAINE HCL (CARDIAC) PF 100 MG/5ML IV SOSY
PREFILLED_SYRINGE | INTRAVENOUS | Status: DC | PRN
  Administered 2018-02-10: 100 mg via INTRAVENOUS

## 2018-02-10 MED ORDER — PROPOFOL 10 MG/ML IV BOLUS
INTRAVENOUS | Status: DC | PRN
  Administered 2018-02-10 (×2): 20 mg via INTRAVENOUS
  Administered 2018-02-10: 15 mg via INTRAVENOUS

## 2018-02-10 MED ORDER — SODIUM CHLORIDE 0.9% FLUSH: 3.0000 mL | Freq: Two times a day (BID) | INTRAVENOUS | Status: DC

## 2018-02-10 MED ORDER — ONDANSETRON HCL 4 MG/2ML IJ SOLN: 4.0000 mg | Freq: Four times a day (QID) | INTRAMUSCULAR | Status: DC | PRN

## 2018-02-10 MED ORDER — HEPARIN SODIUM (PORCINE) 1000 UNIT/ML IJ SOLN
INTRAMUSCULAR | Status: DC | PRN
  Administered 2018-02-10: 1000 [IU] via INTRAVENOUS

## 2018-02-10 MED ORDER — SODIUM CHLORIDE 0.9% FLUSH: 3.0000 mL | INTRAVENOUS | Status: DC | PRN

## 2018-02-10 MED ORDER — ACETAMINOPHEN 325 MG PO TABS
ORAL_TABLET | ORAL | Status: AC
  Filled 2018-02-10: qty 2

## 2018-02-10 MED ORDER — HEPARIN SODIUM (PORCINE) 1000 UNIT/ML IJ SOLN
INTRAMUSCULAR | Status: DC | PRN
  Administered 2018-02-10: 14000 [IU] via INTRAVENOUS

## 2018-02-10 MED ORDER — HEPARIN (PORCINE) IN NACL 1000-0.9 UT/500ML-% IV SOLN
INTRAVENOUS | Status: DC | PRN
  Administered 2018-02-10 (×4): 500 mL

## 2018-02-10 MED ORDER — BUPIVACAINE HCL (PF) 0.25 % IJ SOLN
INTRAMUSCULAR | Status: DC | PRN
  Administered 2018-02-10: 60 mL

## 2018-02-10 MED ORDER — MIDAZOLAM HCL 2 MG/2ML IJ SOLN
INTRAMUSCULAR | Status: DC | PRN
  Administered 2018-02-10: 2 mg via INTRAVENOUS

## 2018-02-10 MED ORDER — HEPARIN (PORCINE) IN NACL 1000-0.9 UT/500ML-% IV SOLN
INTRAVENOUS | Status: AC
  Filled 2018-02-10: qty 500

## 2018-02-10 MED ORDER — SODIUM CHLORIDE 0.9 % IV SOLN: 250.0000 mL | INTRAVENOUS | Status: DC | PRN

## 2018-02-10 MED ORDER — BUPIVACAINE HCL (PF) 0.25 % IJ SOLN
INTRAMUSCULAR | Status: AC
  Filled 2018-02-10: qty 30

## 2018-02-10 MED ORDER — GLYCOPYRROLATE 0.2 MG/ML IJ SOLN
INTRAMUSCULAR | Status: DC | PRN
  Administered 2018-02-10: 0.2 mg via INTRAVENOUS

## 2018-02-10 MED ORDER — ACETAMINOPHEN 325 MG PO TABS
650.0000 mg | ORAL_TABLET | ORAL | Status: DC | PRN
  Administered 2018-02-10: 650 mg via ORAL
  Filled 2018-02-10: qty 2

## 2018-02-10 MED ORDER — FENTANYL CITRATE (PF) 100 MCG/2ML IJ SOLN
INTRAMUSCULAR | Status: DC | PRN
  Administered 2018-02-10 (×2): 25 ug via INTRAVENOUS
  Administered 2018-02-10: 50 ug via INTRAVENOUS

## 2018-02-10 MED ORDER — HEPARIN SODIUM (PORCINE) 1000 UNIT/ML IJ SOLN
INTRAMUSCULAR | Status: AC
  Filled 2018-02-10: qty 1

## 2018-02-10 MED ORDER — PROPOFOL 500 MG/50ML IV EMUL
INTRAVENOUS | Status: DC | PRN
  Administered 2018-02-10 (×2): 75 ug/kg/min via INTRAVENOUS

## 2018-02-10 MED ORDER — SODIUM CHLORIDE 0.9 % IV SOLN
INTRAVENOUS | Status: DC | PRN
  Administered 2018-02-10 (×2): via INTRAVENOUS

## 2018-02-10 SURGICAL SUPPLY — 23 items
BAG SNAP BAND KOVER 36X36 (MISCELLANEOUS) ×4 IMPLANT
BLANKET WARM UNDERBOD FULL ACC (MISCELLANEOUS) ×2 IMPLANT
CATH JOSEPH QUAD ALLRED 6F REP (CATHETERS) ×2 IMPLANT
CATH MAPPNG PENTARAY F 2-6-2MM (CATHETERS) ×1 IMPLANT
CATH SMTCH THERMOCOOL SF DF (CATHETERS) ×2 IMPLANT
CATH SOUNDSTAR 3D IMAGING (CATHETERS) ×2 IMPLANT
CATH WEBSTER BI DIR CS D-F CRV (CATHETERS) ×2 IMPLANT
COVER SWIFTLINK CONNECTOR (BAG) ×2 IMPLANT
PACK EP LATEX FREE (CUSTOM PROCEDURE TRAY) ×1
PACK EP LF (CUSTOM PROCEDURE TRAY) ×1 IMPLANT
PAD PRO RADIOLUCENT 2001M-C (PAD) ×2 IMPLANT
PATCH CARTO3 (PAD) ×2 IMPLANT
PENTARAY F 2-6-2MM (CATHETERS) ×2
SHEATH AVANTI 11F 11CM (SHEATH) ×2 IMPLANT
SHEATH BAYLIS SUREFLEX  M 8.5 (SHEATH) ×1
SHEATH BAYLIS SUREFLEX M 8.5 (SHEATH) ×1 IMPLANT
SHEATH BAYLIS TORFLEX (SHEATH) ×2 IMPLANT
SHEATH BAYLIS TRANSSEPTAL 98CM (NEEDLE) ×2 IMPLANT
SHEATH PINNACLE 6F 10CM (SHEATH) ×2 IMPLANT
SHEATH PINNACLE 7F 10CM (SHEATH) ×2 IMPLANT
SHEATH PINNACLE 8F 10CM (SHEATH) ×2 IMPLANT
SHEATH PINNACLE 9F 10CM (SHEATH) ×2 IMPLANT
TUBING SMART ABLATE COOLFLOW (TUBING) ×2 IMPLANT

## 2018-02-10 NOTE — Progress Notes (Signed)
Site area: Right groin a 6 and 9 french venous sheath was removed  Site Prior to Removal:  Level 0  Pressure Applied For 20 MINUTES    Bedrest Beginning at 1230p  Manual:   Yes.    Patient Status During Pull:  stable  Post Pull Groin Site:  Level 0  Post Pull Instructions Given:  Yes.    Post Pull Pulses Present:  Yes.    Dressing Applied:  Yes.    Comments:  VS remain stable

## 2018-02-10 NOTE — Progress Notes (Signed)
Dr Elberta Fortisamnitz in to see client and okay to d/c home after bedrest

## 2018-02-10 NOTE — Transfer of Care (Signed)
Immediate Anesthesia Transfer of Care Note  Patient: Lee Moore  Procedure(s) Performed: PVC ABLATION (N/A )  Patient Location: Cath Lab  Anesthesia Type:MAC  Level of Consciousness: awake and alert   Airway & Oxygen Therapy: Patient Spontanous Breathing and Patient connected to nasal cannula oxygen  Post-op Assessment: Report given to RN, Post -op Vital signs reviewed and stable and Patient moving all extremities  Post vital signs: Reviewed and stable  Last Vitals:  Vitals Value Taken Time  BP 108/71 02/10/2018 11:39 AM  Temp    Pulse 70 02/10/2018 11:42 AM  Resp 24 02/10/2018 11:42 AM  SpO2 100 % 02/10/2018 11:42 AM  Vitals shown include unvalidated device data.  Last Pain:  Vitals:   02/10/18 1133  TempSrc:   PainSc: 0-No pain      Patients Stated Pain Goal: 3 (19/59/74 7185)  Complications: No apparent anesthesia complications

## 2018-02-10 NOTE — Discharge Instructions (Signed)
Post procedure care instructions No driving for 4 days. No lifting over 5 lbs for 1 week. No vigorous or sexual activity for 1 week. You may return to work on 02/17/18. Keep procedure site clean & dry. If you notice increased pain, swelling, bleeding or pus, call/return!  You may shower, but no soaking baths/hot tubs/pools for 1 week.      Femoral Site Care Refer to this sheet in the next few weeks. These instructions provide you with information about caring for yourself after your procedure. Your health care provider may also give you more specific instructions. Your treatment has been planned according to current medical practices, but problems sometimes occur. Call your health care provider if you have any problems or questions after your procedure. What can I expect after the procedure? After your procedure, it is typical to have the following:  Bruising at the site that usually fades within 1-2 weeks.  Blood collecting in the tissue (hematoma) that may be painful to the touch. It should usually decrease in size and tenderness within 1-2 weeks.  Follow these instructions at home:  Take medicines only as directed by your health care provider.  You may shower 24-48 hours after the procedure or as directed by your health care provider. Remove the bandage (dressing) and gently wash the site with plain soap and water. Pat the area dry with a clean towel. Do not rub the site, because this may cause bleeding.  Do not take baths, swim, or use a hot tub until your health care provider approves.  Check your insertion site every day for redness, swelling, or drainage.  Do not apply powder or lotion to the site.  Limit use of stairs to twice a day for the first 2-3 days or as directed by your health care provider.  Do not squat for the first 2-3 days or as directed by your health care provider.  Do not lift over 10 lb (4.5 kg) for 5 days after your procedure or as directed by your health care  provider.  Ask your health care provider when it is okay to: ? Return to work or school. ? Resume usual physical activities or sports. ? Resume sexual activity.  Do not drive home if you are discharged the same day as the procedure. Have someone else drive you.  You may drive 24 hours after the procedure unless otherwise instructed by your health care provider.  Do not operate machinery or power tools for 24 hours after the procedure or as directed by your health care provider.  If your procedure was done as an outpatient procedure, which means that you went home the same day as your procedure, a responsible adult should be with you for the first 24 hours after you arrive home.  Keep all follow-up visits as directed by your health care provider. This is important. Contact a health care provider if:  You have a fever.  You have chills.  You have increased bleeding from the site. Hold pressure on the site. Get help right away if:  You have unusual pain at the site.  You have redness, warmth, or swelling at the site.  You have drainage (other than a small amount of blood on the dressing) from the site.  The site is bleeding, and the bleeding does not stop after 30 minutes of holding steady pressure on the site.  Your leg or foot becomes pale, cool, tingly, or numb. This information is not intended to replace advice given  to you by your health care provider. Make sure you discuss any questions you have with your health care provider. Document Released: 11/16/2013 Document Revised: 08/21/2015 Document Reviewed: 10/02/2013 Elsevier Interactive Patient Education  [employer]2018 [employer]

## 2018-02-10 NOTE — Progress Notes (Signed)
Site area: Left groin a 11 french venous sheath was removed  Site Prior to Removal:  Level 0  Pressure Applied For 20 MINUTES    Bedrest Beginning at 1230p  Manual:   Yes.    Patient Status During Pull: stable  Post Pull Groin Site:  Level 0  Post Pull Instructions Given:  Yes.    Post Pull Pulses Present:  Yes.    Dressing Applied:  No.  Comments:  VS remain stable

## 2018-02-10 NOTE — Progress Notes (Signed)
Up and walked and tolerated well; bilat groins stable, no bleeding or hematoma 

## 2018-02-10 NOTE — Anesthesia Postprocedure Evaluation (Signed)
Anesthesia Post Note  Patient: Lee Moore  Procedure(s) Performed: PVC ABLATION (N/A )     Patient location during evaluation: PACU Anesthesia Type: MAC Level of consciousness: awake and alert and oriented Pain management: pain level controlled Vital Signs Assessment: post-procedure vital signs reviewed and stable Respiratory status: spontaneous breathing, nonlabored ventilation, respiratory function stable and patient connected to nasal cannula oxygen Cardiovascular status: stable and blood pressure returned to baseline Postop Assessment: no apparent nausea or vomiting Anesthetic complications: no    Last Vitals:  Vitals:   02/10/18 1200 02/10/18 1205  BP: 109/71 109/84  Pulse: 67 60  Resp: 17 17  Temp:  36.6 C  SpO2: 100% 100%    Last Pain:  Vitals:   02/10/18 1133  TempSrc:   PainSc: 0-No pain                 Quana Chamberlain A.

## 2018-02-10 NOTE — H&P (Signed)
Lee DakinCory Moore has presented today for surgery, with the diagnosis of PVCs.  The various methods of treatment have been discussed with the patient and family. After consideration of risks, benefits and other options for treatment, the patient has consented to  Procedure(s): Catheter ablation as a surgical intervention .  Risks include but not limited to bleeding, tamponade, heart block, stroke, damage to surrounding organs, among others. The patient's history has been reviewed, patient examined, no change in status, stable for surgery.  I have reviewed the patient's chart and labs.  Questions were answered to the patient's satisfaction.    Tishanna Dunford Elberta Fortisamnitz, MD 02/10/2018 7:09 AM

## 2018-02-13 ENCOUNTER — Encounter (HOSPITAL_COMMUNITY): Payer: Self-pay | Admitting: Cardiology

## 2018-02-15 ENCOUNTER — Emergency Department (HOSPITAL_COMMUNITY): Payer: BLUE CROSS/BLUE SHIELD

## 2018-02-15 ENCOUNTER — Inpatient Hospital Stay (HOSPITAL_COMMUNITY)
Admission: EM | Admit: 2018-02-15 | Discharge: 2018-02-19 | DRG: 342 | Disposition: A | Discharge status: Home / Self Care | Payer: BLUE CROSS/BLUE SHIELD | Attending: Internal Medicine | Admitting: Internal Medicine

## 2018-02-15 ENCOUNTER — Encounter (HOSPITAL_COMMUNITY): Payer: Self-pay | Admitting: Emergency Medicine

## 2018-02-15 ENCOUNTER — Other Ambulatory Visit: Payer: Self-pay

## 2018-02-15 DIAGNOSIS — I493 Ventricular premature depolarization: Secondary | ICD-10-CM | POA: Diagnosis present

## 2018-02-15 DIAGNOSIS — I252 Old myocardial infarction: Secondary | ICD-10-CM

## 2018-02-15 DIAGNOSIS — Z79899 Other long term (current) drug therapy: Secondary | ICD-10-CM

## 2018-02-15 DIAGNOSIS — I2119 ST elevation (STEMI) myocardial infarction involving other coronary artery of inferior wall: Secondary | ICD-10-CM | POA: Diagnosis present

## 2018-02-15 DIAGNOSIS — Z8249 Family history of ischemic heart disease and other diseases of the circulatory system: Secondary | ICD-10-CM

## 2018-02-15 DIAGNOSIS — E785 Hyperlipidemia, unspecified: Secondary | ICD-10-CM | POA: Diagnosis present

## 2018-02-15 DIAGNOSIS — N179 Acute kidney failure, unspecified: Secondary | ICD-10-CM | POA: Diagnosis present

## 2018-02-15 DIAGNOSIS — I2510 Atherosclerotic heart disease of native coronary artery without angina pectoris: Secondary | ICD-10-CM

## 2018-02-15 DIAGNOSIS — Z9861 Coronary angioplasty status: Secondary | ICD-10-CM | POA: Diagnosis not present

## 2018-02-15 DIAGNOSIS — K3580 Unspecified acute appendicitis: Principal | ICD-10-CM | POA: Diagnosis present

## 2018-02-15 DIAGNOSIS — Z955 Presence of coronary angioplasty implant and graft: Secondary | ICD-10-CM | POA: Diagnosis not present

## 2018-02-15 DIAGNOSIS — N182 Chronic kidney disease, stage 2 (mild): Secondary | ICD-10-CM | POA: Diagnosis present

## 2018-02-15 DIAGNOSIS — I130 Hypertensive heart and chronic kidney disease with heart failure and stage 1 through stage 4 chronic kidney disease, or unspecified chronic kidney disease: Secondary | ICD-10-CM | POA: Diagnosis present

## 2018-02-15 DIAGNOSIS — Z01810 Encounter for preprocedural cardiovascular examination: Secondary | ICD-10-CM

## 2018-02-15 DIAGNOSIS — I255 Ischemic cardiomyopathy: Secondary | ICD-10-CM | POA: Diagnosis present

## 2018-02-15 DIAGNOSIS — I5022 Chronic systolic (congestive) heart failure: Secondary | ICD-10-CM | POA: Diagnosis present

## 2018-02-15 DIAGNOSIS — Z7902 Long term (current) use of antithrombotics/antiplatelets: Secondary | ICD-10-CM | POA: Diagnosis not present

## 2018-02-15 DIAGNOSIS — R109 Unspecified abdominal pain: Secondary | ICD-10-CM | POA: Diagnosis present

## 2018-02-15 DIAGNOSIS — I10 Essential (primary) hypertension: Secondary | ICD-10-CM | POA: Diagnosis not present

## 2018-02-15 DIAGNOSIS — I469 Cardiac arrest, cause unspecified: Secondary | ICD-10-CM | POA: Diagnosis present

## 2018-02-15 DIAGNOSIS — I4901 Ventricular fibrillation: Secondary | ICD-10-CM

## 2018-02-15 HISTORY — DX: Atherosclerotic heart disease of native coronary artery without angina pectoris: I25.10

## 2018-02-15 HISTORY — DX: Essential (primary) hypertension: I10

## 2018-02-15 HISTORY — DX: Personal history of sudden cardiac arrest: Z86.74

## 2018-02-15 LAB — CBC WITH DIFFERENTIAL/PLATELET
Abs Immature Granulocytes: 0.02 10*3/uL (ref 0.00–0.07)
BASOS ABS: 0.1 10*3/uL (ref 0.0–0.1)
BASOS PCT: 1 %
EOS PCT: 3 %
Eosinophils Absolute: 0.3 10*3/uL (ref 0.0–0.5)
HCT: 46.8 % (ref 39.0–52.0)
Hemoglobin: 14.9 g/dL (ref 13.0–17.0)
Immature Granulocytes: 0 %
LYMPHS PCT: 15 %
Lymphs Abs: 1.5 10*3/uL (ref 0.7–4.0)
MCH: 29.2 pg (ref 26.0–34.0)
MCHC: 31.8 g/dL (ref 30.0–36.0)
MCV: 91.6 fL (ref 80.0–100.0)
MONO ABS: 0.6 10*3/uL (ref 0.1–1.0)
Monocytes Relative: 5 %
NEUTROS ABS: 8.1 10*3/uL — AB (ref 1.7–7.7)
NRBC: 0 % (ref 0.0–0.2)
Neutrophils Relative %: 76 %
PLATELETS: 275 10*3/uL (ref 150–400)
RBC: 5.11 MIL/uL (ref 4.22–5.81)
RDW: 14 % (ref 11.5–15.5)
WBC: 10.6 10*3/uL — ABNORMAL HIGH (ref 4.0–10.5)

## 2018-02-15 LAB — COMPREHENSIVE METABOLIC PANEL
ALBUMIN: 4.5 g/dL (ref 3.5–5.0)
ALT: 36 U/L (ref 0–44)
ANION GAP: 10 (ref 5–15)
AST: 22 U/L (ref 15–41)
Alkaline Phosphatase: 57 U/L (ref 38–126)
BUN: 15 mg/dL (ref 6–20)
CHLORIDE: 103 mmol/L (ref 98–111)
CO2: 23 mmol/L (ref 22–32)
Calcium: 9.7 mg/dL (ref 8.9–10.3)
Creatinine, Ser: 1.25 mg/dL — ABNORMAL HIGH (ref 0.61–1.24)
GFR calc non Af Amer: >60 mL/min (ref >60)
GLUCOSE: 110 mg/dL — AB (ref 70–99)
POTASSIUM: 4.2 mmol/L (ref 3.5–5.1)
SODIUM: 136 mmol/L (ref 135–145)
Total Bilirubin: 1.2 mg/dL (ref 0.3–1.2)
Total Protein: 8.1 g/dL (ref 6.5–8.1)

## 2018-02-15 LAB — MAGNESIUM: Magnesium: 2 mg/dL (ref 1.7–2.4)

## 2018-02-15 LAB — URINALYSIS, ROUTINE W REFLEX MICROSCOPIC
BACTERIA UA: NONE SEEN
BILIRUBIN URINE: NEGATIVE
Glucose, UA: NEGATIVE mg/dL
KETONES UR: 5 mg/dL — AB
LEUKOCYTES UA: NEGATIVE
NITRITE: NEGATIVE
Protein, ur: NEGATIVE mg/dL
Specific Gravity, Urine: 1.023 (ref 1.005–1.030)
pH: 5 (ref 5.0–8.0)

## 2018-02-15 MED ORDER — ONDANSETRON HCL 4 MG/2ML IJ SOLN: 4.0000 mg | Freq: Four times a day (QID) | INTRAMUSCULAR | Status: DC | PRN

## 2018-02-15 MED ORDER — HYDROMORPHONE HCL 1 MG/ML IJ SOLN
1.0000 mg | Freq: Once | INTRAMUSCULAR | Status: AC
  Administered 2018-02-15: 1 mg via INTRAVENOUS
  Filled 2018-02-15: qty 1

## 2018-02-15 MED ORDER — ATORVASTATIN CALCIUM 80 MG PO TABS
80.0000 mg | ORAL_TABLET | Freq: Every day | ORAL | Status: DC
  Administered 2018-02-15 – 2018-02-18 (×4): 80 mg via ORAL
  Filled 2018-02-15 (×4): qty 1

## 2018-02-15 MED ORDER — ONDANSETRON HCL 4 MG PO TABS: 4.0000 mg | ORAL_TABLET | Freq: Four times a day (QID) | ORAL | Status: DC | PRN

## 2018-02-15 MED ORDER — AMIODARONE HCL 200 MG PO TABS
200.0000 mg | ORAL_TABLET | Freq: Every day | ORAL | Status: DC
  Administered 2018-02-15 – 2018-02-18 (×4): 200 mg via ORAL
  Filled 2018-02-15 (×4): qty 1

## 2018-02-15 MED ORDER — ACETAMINOPHEN 325 MG PO TABS
650.0000 mg | ORAL_TABLET | Freq: Four times a day (QID) | ORAL | Status: DC | PRN
  Administered 2018-02-18: 650 mg via ORAL
  Filled 2018-02-15: qty 2

## 2018-02-15 MED ORDER — SPIRONOLACTONE 12.5 MG HALF TABLET
12.5000 mg | ORAL_TABLET | Freq: Every day | ORAL | Status: DC
  Administered 2018-02-15 – 2018-02-18 (×4): 12.5 mg via ORAL
  Filled 2018-02-15 (×5): qty 1

## 2018-02-15 MED ORDER — ENOXAPARIN SODIUM 40 MG/0.4ML ~~LOC~~ SOLN
40.0000 mg | SUBCUTANEOUS | Status: DC
  Administered 2018-02-15 – 2018-02-16 (×2): 40 mg via SUBCUTANEOUS
  Filled 2018-02-15 (×2): qty 0.4

## 2018-02-15 MED ORDER — HYDROMORPHONE HCL 1 MG/ML IJ SOLN
0.5000 mg | Freq: Once | INTRAMUSCULAR | Status: AC
  Administered 2018-02-15: 0.5 mg via INTRAVENOUS
  Filled 2018-02-15: qty 1

## 2018-02-15 MED ORDER — SACUBITRIL-VALSARTAN 24-26 MG PO TABS
1.0000 | ORAL_TABLET | Freq: Two times a day (BID) | ORAL | Status: DC
  Administered 2018-02-15 – 2018-02-18 (×7): 1 via ORAL
  Filled 2018-02-15 (×10): qty 1

## 2018-02-15 MED ORDER — ASPIRIN EC 81 MG PO TBEC
81.0000 mg | DELAYED_RELEASE_TABLET | Freq: Every day | ORAL | Status: DC
  Administered 2018-02-15 – 2018-02-16 (×2): 81 mg via ORAL
  Filled 2018-02-15 (×2): qty 1

## 2018-02-15 MED ORDER — PIPERACILLIN-TAZOBACTAM 3.375 G IVPB 30 MIN
3.3750 g | Freq: Once | INTRAVENOUS | Status: AC
  Administered 2018-02-15: 3.375 g via INTRAVENOUS
  Filled 2018-02-15: qty 50

## 2018-02-15 MED ORDER — CARVEDILOL 6.25 MG PO TABS
6.2500 mg | ORAL_TABLET | Freq: Two times a day (BID) | ORAL | Status: DC
  Administered 2018-02-15 – 2018-02-19 (×9): 6.25 mg via ORAL
  Filled 2018-02-15 (×9): qty 1

## 2018-02-15 MED ORDER — POLYETHYLENE GLYCOL 3350 17 G PO PACK: 17.0000 g | PACK | Freq: Every day | ORAL | Status: DC | PRN

## 2018-02-15 MED ORDER — MORPHINE SULFATE (PF) 2 MG/ML IV SOLN: 1.0000 mg | INTRAVENOUS | Status: DC | PRN

## 2018-02-15 MED ORDER — SODIUM CHLORIDE 0.9 % IV SOLN
INTRAVENOUS | Status: DC
  Administered 2018-02-15 – 2018-02-16 (×2): via INTRAVENOUS

## 2018-02-15 MED ORDER — HYDROCODONE-ACETAMINOPHEN 5-325 MG PO TABS
1.0000 | ORAL_TABLET | ORAL | Status: DC | PRN
  Administered 2018-02-15: 2 via ORAL
  Administered 2018-02-15: 1 via ORAL
  Administered 2018-02-16 – 2018-02-19 (×3): 2 via ORAL
  Administered 2018-02-19: 1 via ORAL
  Filled 2018-02-15: qty 1
  Filled 2018-02-15 (×5): qty 2
  Filled 2018-02-15: qty 1

## 2018-02-15 MED ORDER — IOHEXOL 300 MG/ML  SOLN
100.0000 mL | Freq: Once | INTRAMUSCULAR | Status: AC | PRN
  Administered 2018-02-15: 100 mL via INTRAVENOUS

## 2018-02-15 MED ORDER — ACETAMINOPHEN 650 MG RE SUPP: 650.0000 mg | Freq: Four times a day (QID) | RECTAL | Status: DC | PRN

## 2018-02-15 NOTE — ED Notes (Signed)
Admitting Dr bedside 

## 2018-02-15 NOTE — ED Notes (Addendum)
Cardiology at bedside speaking with patient.  Cards does NOT want pt to have Brilinta at this time.

## 2018-02-15 NOTE — ED Notes (Signed)
Per admittint Dr Mal MistyNetty, ok to give coreg without food

## 2018-02-15 NOTE — ED Notes (Signed)
Patient transported to CT 

## 2018-02-15 NOTE — H&P (Signed)
History and Physical  Lee Moore ZOX:[id number] DOB: Sep 14, 1975 DOA: 02/15/2018   PCP: Patient, No Pcp Per  Patient coming from: Home  Chief Complaint: abdominal pain   HPI: Lee Moore is a 42 y.o. 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  Patient was last admitted for similar presentation on 10/2017 found to have acute appendicitis which was managed conservatively with pain control and Augmentin course by surgery.  Patient states this abdominal pain was very similar to that, denies any nausea, vomiting, chest pain, constipation or diarrhea.  Denies any fevers or chills.  Regarding his chronic CHF with reduced EF and CAD status post DES to RCA (07/2017).  Patient has not had any symptoms of chest pain or worsening shortness of breath.  He denies any significant swelling, PND or orthopnea.  He has been adherent to his medication regimen.  For his frequent PVCs patient underwent recent ablation 11/15 without complications.    ED Course: In the ED he was afebrile and hemodynamically stable.  Lab work significant for creatinine of 1.25 (baseline 1), white count 10.6.  CT abdomen showed acute appendicitis with no perforation or fluid collections.  He was started on empiric IV Zosyn and given 0.5 IV Dilaudid for pain control.  [facility] hospitalist was called to admit in addition to obtaining cardiac clearance before any procedure by her surgical consultants.  Review of Systems:As mentioned in the history of present illness.Review of systems are otherwise negative Patient seen in the ED   Past Medical History:    Diagnosis Date  . MI (myocardial infarction) [facility](HCC)    Past Surgical History:  Procedure Laterality Date  . CORONARY/GRAFT ACUTE MI REVASCULARIZATION N/A 07/30/2017   Procedure: Coronary/Graft Acute MI Revascularization;  Surgeon: Rinaldo CloudHarwani, Mohan, MD;  Location: [facility] INVASIVE CV LAB;  Service: Cardiovascular;  Laterality: N/A;  . LEFT HEART CATH AND CORONARY ANGIOGRAPHY N/A 07/30/2017   Procedure: LEFT HEART CATH AND CORONARY ANGIOGRAPHY;  Surgeon: Rinaldo CloudHarwani, Mohan, MD;  Location: [facility] INVASIVE CV LAB;  Service: Cardiovascular;  Laterality: N/A;  . PVC ABLATION N/A 02/10/2018   Procedure: PVC ABLATION;  Surgeon: Regan Lemmingamnitz, Will Martin, MD;  Location: [facility] INVASIVE CV LAB;  Service: Cardiovascular;  Laterality: N/A;   No Known Allergies Social History:  reports that he has never smoked. He has never used smokeless tobacco. He reports that he drank alcohol. He reports that he does not use drugs. Family History  Problem Relation Age of Onset  . Hypertension Mother       Prior to Admission medications   Medication Sig Start Date End Date Taking? Authorizing Provider  amiodarone (PACERONE) 200 MG tablet Take 1 tablet (200 mg total) by mouth daily. 01/31/18  Yes Laurey MoraleMcLean, Dalton S, MD  aspirin EC 81 MG tablet Take 81 mg by mouth daily.   Yes [provider]  atorvastatin (LIPITOR) 80 MG tablet Take 1 tablet (80 mg total) by mouth daily at 6 PM. 08/06/17  Yes Strader, GrenadaBrittany M, PA-C  carvedilol (COREG) 6.25 MG tablet Take 1.5 tablets (9.375 mg total) by mouth 2 (two) times daily with a meal. Patient taking differently: Take 6.25 mg by mouth  2 (two) times daily with a meal.  01/31/18  Yes Laurey Morale, MD  sacubitril-valsartan (ENTRESTO) 24-26 MG Take 1 tablet by mouth 2 (two) times daily. 12/22/17  Yes Laurey Morale, MD  spironolactone (ALDACTONE) 25 MG tablet Take 0.5 tablets (12.5 mg total) by mouth daily. 12/22/17  Yes Laurey Morale, MD  ticagrelor (BRILINTA) 90 MG TABS tablet Take 90 mg by  mouth 2 (two) times daily.   Yes [provider]    Physical Exam: BP (!) 92/55 (BP Location: Left Arm)   Pulse 63   Temp 98.7 F (37.1 C) (Oral)   Resp 18   SpO2 98%   Constitutional middle-aged male, lying in bed, no distress Eyes: Anicteric sclera, EOMI ENMT: Moist oral mucosa, normal detention Cardiovascular: RRR no MRGs, with no peripheral edema Respiratory: Normal respiratory effort, clear breath sounds  Abdomen: Soft, nondistended, nontender, decreased bowel sounds Skin: No rash ulcers, or lesions. Without skin tenting  Neurologic: Grossly no focal neuro deficit. Psychiatric:Appropriate affect, and mood. Mental status AAOx3          Labs on Admission:  Basic Metabolic Panel: Recent Labs  Lab 02/15/18 0343  NA 136  K 4.2  CL 103  CO2 23  GLUCOSE 110*  BUN 15  CREATININE 1.25*  CALCIUM 9.7   Liver Function Tests: Recent Labs  Lab 02/15/18 0343  AST 22  ALT 36  ALKPHOS 57  BILITOT 1.2  PROT 8.1  ALBUMIN 4.5   No results for input(s): LIPASE, AMYLASE in the last 168 hours. No results for input(s): AMMONIA in the last 168 hours. CBC: Recent Labs  Lab 02/10/18 0654 02/15/18 0343  WBC 5.1 10.6*  NEUTROABS  --  8.1*  HGB 14.7 14.9  HCT 45.8 46.8  MCV 92.3 91.6  PLT 270 275   Cardiac Enzymes: No results for input(s): CKTOTAL, CKMB, CKMBINDEX, TROPONINI in the last 168 hours.  BNP (last 3 results) No results for input(s): BNP in the last 8760 hours.  ProBNP (last 3 results) No results for input(s): PROBNP in the last 8760 hours.  CBG: No results for input(s): GLUCAP in the last 168 hours.  Radiological Exams on Admission: Ct Abdomen Pelvis W Contrast  Result Date: 02/15/2018 CLINICAL DATA:  42 y/o  M; abdominal pain with nausea. EXAM: CT ABDOMEN AND PELVIS WITH CONTRAST TECHNIQUE: Multidetector CT imaging of the abdomen and pelvis was performed using the standard protocol following bolus administration of intravenous contrast.  CONTRAST:  OMNIPAQUE IOHEXOL 300 MG/ML  SOLN COMPARISON:  11/11/2017 CT abdomen pelvis. FINDINGS: Lower chest: No acute abnormality. Hepatobiliary: No focal liver abnormality is seen. No gallstones, gallbladder wall thickening, or biliary dilatation. Pancreas: Unremarkable. No pancreatic ductal dilatation or surrounding inflammatory changes. Spleen: Normal in size without focal abnormality. Adrenals/Urinary Tract: Adrenal glands are unremarkable. Kidneys are normal, without renal calculi, focal lesion, or hydronephrosis. Bladder is unremarkable. Stomach/Bowel: Sigmoid diverticulosis. Normal appearance of the stomach. No obstructive or inflammatory changes of the small bowel and colon. Acute appendicitis. Appendix: Location: Inferior to cecum. Diameter: 13 mm Appendicolith: Negative. Mucosal hyper-enhancement: Positive. Extraluminal gas: Negative. Periappendiceal collection: Periappendiceal inflammation without abscess. Vascular/Lymphatic: No significant vascular findings are present. No enlarged abdominal or pelvic lymph nodes. Reproductive: Prostate is unremarkable. Other: Small stable periumbilical hernia containing fat. No abdominopelvic ascites. Musculoskeletal: No acute or significant osseous findings. IMPRESSION: 1. Acute appendicitis.  No findings of perforation or abscess. 2. Sigmoid diverticulosis, no evidence of acute diverticulitis. Electronically Signed   By: Micah Noel  [place]-Stratton M.D.   On: 02/15/2018 05:33    EKG: Independently reviewed. Sinus rhythm with frequent PVCs  Assessment/Plan Present on Admission: . Acute appendicitis without peritonitis . Cardiac arrest with ventricular fibrillation (HCC) . Essential hypertension . Ischemic cardiomyopathy . ST elevation myocardial infarction (STEMI) of inferior wall (HCC) . AKI (acute kidney injury) (HCC)  Active Problems:   Cardiac arrest with ventricular fibrillation (HCC)   ST elevation myocardial infarction (STEMI) of inferior  wall (HCC)   Essential hypertension   Ischemic cardiomyopathy   Acute appendicitis without peritonitis   CAD S/P percutaneous coronary angioplasty   AKI (acute kidney injury) (HCC)  Acute appendicitis without peritonitis.  Second episode in the past year.  Previous episode on 10/2017 treated conservatively by surgery team with Augmentin and eventual resolution of symptoms.  CT abdomen shows no perforation or other concerning findings. -Surgery plans to proceed to the OR later this week -Patient is cardiology clearance given complex history as mentioned below -Holding home Brilinta, last dose 11/19 will need washout(2 to 3 days?) - Clear liquid diet for now given persistent abdominal pain  AKI, suspect prerenal etiology in setting of decreased p.o. intake related to above pain. - 75 cc normal saline x24 hours, judicious use given history of chronic systolic heart failure should be able to tolerate given euvolemic status currently -Repeat BMP in a.m.  CAD status post inferior STEMI 07/2017 status post DES to RCA. Complicated by pulseless VT for which he underwent CPR and underwent hypothermia protocol during that admission. Also has history of stents after MI in 2017 [facility]) Has been on aspirin and Brilinta since then .  Reports adequate exercise tolerance able to go up 4 flights of steps without significant dyspnea or CP. - Hold Brilinta given upcoming procedure -Continue aspirin as recommended by cardiology -Awaiting further recommendations from cardiology given stent placed 6 months ago  Chronic CHF with reduced EF.  Last TTE on 12/2016 with EF 45 to 50% (previously 35% after recent STEMI as mentioned above) disease.  Euvolemic on exam. -Cardiology recommends continuing his home Entresto, carvedilol, spironolactone at current dose -We will closely monitor blood pressure - No need for fluids currently since patient will be on clear liquid diet for now. -Daily weights, strict I's and  O's  History of PVCs, status post PVC ablation on 11/15. Persistent on EKG -Continue to monitor on telemetry -Continue amiodarone 200 mg -Check mag, goal potassium greater than 4, mag at least 2  DVT prophylaxis: lovenox  Code Status: Full code   Family Communication:  No family at bedside   Disposition Plan: admit to inpatient telemetry unit. Anticipate needing > 2 midnights to undego brilinta washout before proceeding with OR intervention for surgery given recurrent appendicitis   Consults called: EDP called Cardiology and Surgery       Laverna Peace MD [facility]  Pager [phone]  If 7PM-7AM, please contact night-coverage [email or web address] Password [email or web address]  02/15/2018, 2:28 PM

## 2018-02-15 NOTE — Consult Note (Addendum)
Advanced Heart Failure Team Consult Note   Primary Physician: Patient, No Pcp Per PCP-Cardiologist: Dr Shirlee Latch  EP: Dr Elberta Fortis Reason for Consultation: Heart Failure   HPI:    Lee Moore is seen today for evaluation of surgical clearance/heart failure at the request of Dr Derrell Lolling.   Lee Moore is 42 year old with history of CAD, MI 2017 with 5 stents RCA, inferior MI STEMI 2019, VT, and PVCs with an ablation earlier this month.   In may 2019 he had pulseless VT with CPR and underwent hypothermia protocol. LHC 07/2017 showed occluded RCA with relatively clear left coronary system. DES to proximal-mid RCA restoring flow to an acute marginal, however, the distal RCA/PLV/PDA remained occluded.  There were collaterals to the PLV from the left.  ECHO during this admit showed EF 30-35%.   In August he had acute appendicitis that resolved with antibiotics.  In October of this year he had Lexiscan Cardiolite with fixed inferior defect, no ischemia.   He has been followed closely by Dr Shirlee Latch and had repeat ECHO in October that showed EF 45-50% with frequent PVCs. He was referred to Dr Elberta Fortis for PVC ablation. PVC ablation was completed on  02/10/2018. Since that time he has been feeling good. Denies chest pain/dyspnea. No fever or chills. Walking around without difficulty.  He took last dose of brillinta on 02/14/18.   Yesterday he had RLQ abdominal pain. Due to persistent pain he presented to [facility] for an evaluation. CT scan showed acute appendicitis without abscess or perforation Pertinent labs included: K 4.2, Creatinine 1.25, WBC 10.6. . Evaluated by surgery with recommendations for laprascopic appendectomy.     Echo 01/16/2018 EF 45-50% with frequent PVCs.  Echo in 7/19 showed EF improved to 50-55%. Echo 07/2017 EF 30-35%     Review of Systems: [y] = yes, [ ]  = no   General: Weight gain [ ] ; Weight loss [ ] ; Anorexia [ ] ; Fatigue [ ] ; Fever [ ] ; Chills [ ] ; Weakness [ ]   Cardiac: Chest  pain/pressure [ ] ; Resting SOB [ ] ; Exertional SOB [ ] ; Orthopnea [ ] ; Pedal Edema [ ] ; Palpitations [ ] ; Syncope [ ] ; Presyncope [ ] ; Paroxysmal nocturnal dyspnea[ ]   Pulmonary: Cough [ ] ; Wheezing[ ] ; Hemoptysis[ ] ; Sputum [ ] ; Snoring [ ]   GI: Vomiting[ ] ; Dysphagia[ ] ; Melena[ ] ; Hematochezia [ ] ; Heartburn[ ] ; Abdominal pain [Y ]; Constipation [ ] ; Diarrhea [ ] ; BRBPR [ ]   GU: Hematuria[ ] ; Dysuria [ ] ; Nocturia[ ]   Vascular: Pain in legs with walking [ ] ; Pain in feet with lying flat [ ] ; Non-healing sores [ ] ; Stroke [ ] ; TIA [ ] ; Slurred speech [ ] ;  Neuro: Headaches[ ] ; Vertigo[ ] ; Seizures[ ] ; Paresthesias[ ] ;Blurred vision [ ] ; Diplopia [ ] ; Vision changes [ ]   Ortho/Skin: Arthritis [ ] ; Joint pain [ Y]; Muscle pain [ ] ; Joint swelling [ ] ; Back Pain [ ] ; Rash [ ]   Psych: Depression[ ] ; Anxiety[ ]   Heme: Bleeding problems [ ] ; Clotting disorders [ ] ; Anemia [ ]   Endocrine: Diabetes [ ] ; Thyroid dysfunction[ ]   Home Medications Prior to Admission medications   Medication Sig Start Date End Date Taking? Authorizing Provider  amiodarone (PACERONE) 200 MG tablet Take 1 tablet (200 mg total) by mouth daily. 01/31/18  Yes Laurey Morale, MD  aspirin EC 81 MG tablet Take 81 mg by mouth daily.   Yes [provider]  atorvastatin (LIPITOR) 80 MG tablet Take 1  tablet (80 mg total) by mouth daily at 6 PM. 08/06/17  Yes Strader, [place] M, PA-C  carvedilol (COREG) 6.25 MG tablet Take 1.5 tablets (9.375 mg total) by mouth 2 (two) times daily with a meal. Patient taking differently: Take 6.25 mg by mouth 2 (two) times daily with a meal.  01/31/18  Yes Laurey Morale, MD  sacubitril-valsartan (ENTRESTO) 24-26 MG Take 1 tablet by mouth 2 (two) times daily. 12/22/17  Yes Laurey Morale, MD  spironolactone (ALDACTONE) 25 MG tablet Take 0.5 tablets (12.5 mg total) by mouth daily. 12/22/17  Yes Laurey Morale, MD  ticagrelor (BRILINTA) 90 MG TABS tablet Take 90 mg by mouth 2 (two) times  daily.   Yes [provider]    Past Medical History: Past Medical History:  Diagnosis Date  . MI (myocardial infarction) [facility])     Past Surgical History: Past Surgical History:  Procedure Laterality Date  . CORONARY/GRAFT ACUTE MI REVASCULARIZATION N/A 07/30/2017   Procedure: Coronary/Graft Acute MI Revascularization;  Surgeon: Rinaldo Cloud, MD;  Location: [facility] IN[facility];  Service: Cardiovascular;  Laterality: N/A;  . LEFT HEART CATH AND CORONARY ANGIOGRAPHY N/A 07/30/2017   Procedure: LEFT HEART CATH AND CORONARY ANGIOGRAPHY;  Surgeon: Rinaldo Cloud, MD;  Location: [facility] INVASIVE CV LAB;  Service: Cardiovascular;  Laterality: N/A;  . PVC ABLATION N/A 02/10/2018   Procedure: PVC ABLATION;  Surgeon: Regan Lemming, MD;  Location: [facility] INVASIVE CV LAB;  Service: Cardiovascular;  Laterality: N/A;    Family History: - No family h/o premature CAD or sudden cardiac death  Social History: Social History   Socioeconomic History  . Marital status: Married    Spouse name: Not on file  . Number of children: 2  . Years of education: Not on file  . Highest education level: Master's degree (e.g., MA, MS, MEng, MEd, MSW, MBA)  Occupational History    Employer: [employer]  Social Needs  . Financial resource strain: Not very hard  . Food insecurity:    Worry: Never true    Inability: Never true  . Transportation needs:    Medical: No    Non-medical: No  Tobacco Use  . Smoking status: Never Smoker  . Smokeless tobacco: Never Used  Substance and Sexual Activity  . Alcohol use: Not Currently    Comment: unknown  . Drug use: Never  . Sexual activity: Yes  Lifestyle  . Physical activity:    Days per week: Not on file    Minutes per session: Not on file  . Stress: Not on file  Relationships  . Social connections:    Talks on phone: Not on file    Gets together: Not on file    Attends religious service: Not on file    Active member of club or organization: Not on file     Attends meetings of clubs or organizations: Not on file    Relationship status: Not on file  Other Topics Concern  . Not on file  Social History Narrative   Senior VP at [employer]    Allergies:  No Known Allergies  Objective:    Vital Signs:   Temp:  [98.9 F (37.2 C)] 98.9 F (37.2 C) (11/20 0321) Pulse Rate:  [25-72] 65 (11/20 1006) Resp:  [11-21] 18 (11/20 1006) BP: (91-132)/(58-90) 103/58 (11/20 1006) SpO2:  [96 %-100 %] 100 % (11/20 1006)    Weight change: There were no vitals filed for this visit.  Intake/Output:  Intake/Output Summary (Last 24 hours) at 02/15/2018 1055 Last data filed at 02/15/2018 0827 Gross per 24 hour  Intake 50 ml  Output -  Net 50 ml      Physical Exam    General:   No resp difficulty HEENT: normal Neck: supple. JVP 5-6  . Carotids 2+ bilat; no bruits. No lymphadenopathy or thyromegaly appreciated. Cor: PMI nondisplaced. Regular rate & rhythm. No rubs, gallops or murmurs. Lungs: clear Abdomen: soft, RLE tender, nondistended. No hepatosplenomegaly. No bruits or masses. Good bowel sounds. Extremities: no cyanosis, clubbing, rash, edema Neuro: alert & orientedx3, cranial nerves grossly intact. moves all 4 extremities w/o difficulty. Affect pleasant   Telemetry   SR 60s with PVCs   EKG    SR 63 PVCs   Labs   Basic Metabolic Panel: Recent Labs  Lab 02/15/18 0343  NA 136  K 4.2  CL 103  CO2 23  GLUCOSE 110*  BUN 15  CREATININE 1.25*  CALCIUM 9.7    Liver Function Tests: Recent Labs  Lab 02/15/18 0343  AST 22  ALT 36  ALKPHOS 57  BILITOT 1.2  PROT 8.1  ALBUMIN 4.5   No results for input(s): LIPASE, AMYLASE in the last 168 hours. No results for input(s): AMMONIA in the last 168 hours.  CBC: Recent Labs  Lab 02/10/18 0654 02/15/18 0343  WBC 5.1 10.6*  NEUTROABS  --  8.1*  HGB 14.7 14.9  HCT 45.8 46.8  MCV 92.3 91.6  PLT 270 275    Cardiac Enzymes: No results for input(s): CKTOTAL, CKMB,  CKMBINDEX, TROPONINI in the last 168 hours.  BNP: BNP (last 3 results) No results for input(s): BNP in the last 8760 hours.  ProBNP (last 3 results) No results for input(s): PROBNP in the last 8760 hours.   CBG: No results for input(s): GLUCAP in the last 168 hours.  Coagulation Studies: No results for input(s): LABPROT, INR in the last 72 hours.   Imaging   Ct Abdomen Pelvis W Contrast  Result Date: 02/15/2018 CLINICAL DATA:  42 y/o  M; abdominal pain with nausea. EXAM: CT ABDOMEN AND PELVIS WITH CONTRAST TECHNIQUE: Multidetector CT imaging of the abdomen and pelvis was performed using the standard protocol following bolus administration of intravenous contrast. CONTRAST:  OMNIPAQUE IOHEXOL 300 MG/ML  SOLN COMPARISON:  11/11/2017 CT abdomen pelvis. FINDINGS: Lower chest: No acute abnormality. Hepatobiliary: No focal liver abnormality is seen. No gallstones, gallbladder wall thickening, or biliary dilatation. Pancreas: Unremarkable. No pancreatic ductal dilatation or surrounding inflammatory changes. Spleen: Normal in size without focal abnormality. Adrenals/Urinary Tract: Adrenal glands are unremarkable. Kidneys are normal, without renal calculi, focal lesion, or hydronephrosis. Bladder is unremarkable. Stomach/Bowel: Sigmoid diverticulosis. Normal appearance of the stomach. No obstructive or inflammatory changes of the small bowel and colon. Acute appendicitis. Appendix: Location: Inferior to cecum. Diameter: 13 mm Appendicolith: Negative. Mucosal hyper-enhancement: Positive. Extraluminal gas: Negative. Periappendiceal collection: Periappendiceal inflammation without abscess. Vascular/Lymphatic: No significant vascular findings are present. No enlarged abdominal or pelvic lymph nodes. Reproductive: Prostate is unremarkable. Other: Small stable periumbilical hernia containing fat. No abdominopelvic ascites. Musculoskeletal: No acute or significant osseous findings. IMPRESSION: 1. Acute  appendicitis.  No findings of perforation or abscess. 2. Sigmoid diverticulosis, no evidence of acute diverticulitis. Electronically Signed   By: Mitzi Hansen M.D.   On: 02/15/2018 05:33      Medications:     Current Medications: . amiodarone  200 mg Oral Daily  . aspirin EC  81 mg Oral Daily  .  atorvastatin  80 mg Oral q1800  . carvedilol  6.25 mg Oral BID WC  . enoxaparin (LOVENOX) injection  40 mg Subcutaneous Q24H  . sacubitril-valsartan  1 tablet Oral BID  . spironolactone  12.5 mg Oral Daily     Infusions:     Patient Profile   Lee Jean RosenthalJackson is 42 year old with history of CAD, MI 2017 with 5 stents RCA, inferior MI STEMI 2019, VT, and PVCs with an ablation earlier this month.   Admitted today for acute appendicitis.   Assessment/Plan   1. Acute Appendicitis CT showed acute appendicitis. Surgery recommending laparoscopic appendectomy. Ok to hold brillinta.  Ok to proceed with surgery. He is low-moderate risk  for surgery. - We will follow with you.    2. [facility]  He has inferior MI 07/2017 with DES to proximal/mid RCA but distal RCA and PLV/PDA remained occluded. Lexiscan 12/2017 with inferior scar.  -No s/s ischemia.  -Ok to stop brillinta. Continue aspirin and atorvastatin.   3. Chronic Systolic Heart Failure ECHO 12/2017 EF 45-50%. Volume status stable.  Continue entresto, carveidlol, and spironolactone at current dose.  Watch bp closely.   4. 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 Continue amiodarone 200 mg daily.  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   5. H/O VT  07/2017 with pulseless VR with CPR and hypothermia protocol.     Medication concerns reviewed with patient and pharmacy team. Barriers identified: none.   Length of Stay: 0  Tonye BecketAmy Clegg, NP  02/15/2018, 10:55 AM  Advanced Heart Failure Team Pager [phone] (M-F; 7a - 4p)  Please contact [facility] for night-coverage after hours  (4p -7a ) and weekends on [email or web address]  Patient seen and examined with the above-signed Advanced Practice Provider and/or Housestaff. I personally reviewed laboratory data, imaging studies and relevant notes. I independently examined the patient and formulated the important aspects of the plan. I have edited the note to reflect any of my changes or salient points. I have personally discussed the plan with the patient and/or family.  42 y/o male with h/o CAD and ischemic CM with recovered EF now 45-50%. S/p recent PVC ablation. Now presents with recurrent acute appendicitis. We are asked to see for surgical clearance. He has been active without any ischemic or HF symptoms. Has been on Brillinta for RCA stent in 5/19 but distal vessel remain occluded. Recent PVC ablation but still with PVCs on exam.   On exam Looks well No JVD RRR with occ PVCs no s3 Lungs CTA Ab; Tender over McBurney's point. No rebound Ext warm No edema  He is dong well from a cardiac standpoint. Overall low risk for peri-op CV complications. The one confounding factor is that he has been on Brillinta. We have stopped this. Will check P2Y12 level in am to assess level of platelet suppression. Would recommend waiting 2-3 days minimum prior to surgery if possible. If platelet function markedly suppressed may need 5 day wait. We will follow. PVCs are still present after recent ablation but likely too soon to know how effective ablation will be.   Arvilla Meresaniel , MD  4:07 PM

## 2018-02-15 NOTE — ED Provider Notes (Signed)
[facility] EMERGENCY DEPARTMENT Provider Note   CSN: [id number] Arrival date & time: 02/15/18  4098     History   Chief Complaint Chief Complaint  Patient presents with  . Abdominal Pain    HPI Lee Moore is a 42 y.o. male.  Patient with a history of MI, HTN, ischemic cardiomyopathy, presents with complaint of RLQ abdominal pain that started around 9:30 pm last night. No nausea, vomiting, bowel movement changes, fever. He reports having appendicitis in August of this year, treated with antibiotics and without surgery.  He states his current pain feels the same as before. No urinary symptoms. He denies chest pain, SOB.  The history is provided by the patient. No language interpreter was used.  Abdominal Pain   Pertinent negatives include fever, diarrhea, nausea and vomiting.    Past Medical History:  Diagnosis Date  . MI (myocardial infarction) [facility])     Patient Active Problem List   Diagnosis Date Noted  . Acute appendicitis 11/12/2017  . ST elevation myocardial infarction (STEMI) of inferior wall (HCC) 08/06/2017  . Essential hypertension 08/06/2017  . Ischemic cardiomyopathy 08/06/2017  . Cardiogenic shock (HCC)   . Cardiac arrest with ventricular fibrillation (HCC) 07/30/2017    Past Surgical History:  Procedure Laterality Date  . CORONARY/GRAFT ACUTE MI REVASCULARIZATION N/A 07/30/2017   Procedure: Coronary/Graft Acute MI Revascularization;  Surgeon: Rinaldo Cloud, MD;  [facility]: [facility] IN[facility] CV LAB;  Service: Cardiovascular;  Laterality: N/A;  . LEFT HEART CATH AND CORONARY ANGIOGRAPHY N/A 07/30/2017   Procedure: LEFT HEART CATH AND CORONARY ANGIOGRAPHY;  Surgeon: Rinaldo Cloud, MD;  Location: [facility] INV[facility] CV LAB;  Service: Cardiovascular;  Laterality: N/A;  . PVC ABLATION N/A 02/10/2018   Procedure: PVC ABLATION;  Surgeon: Regan Lemming, MD;  Location: [facility] INVAS[facility] CV LAB;  Service: Cardiovascular;  Laterality: N/A;        Home  Medications    Prior to Admission medications   Medication Sig Start Date End Date Taking? Authorizing Provider  allopurinol (ZYLOPRIM) 100 MG tablet Take 1 tablet (100 mg total) by mouth daily. Patient not taking: Reported on 02/08/2018 01/31/18   Laurey Morale, MD  amiodarone (PACERONE) 200 MG tablet Take 1 tablet (200 mg total) by mouth daily. 01/31/18   Laurey Morale, MD  aspirin EC 81 MG tablet Take 81 mg by mouth daily.    [provider]  atorvastatin (LIPITOR) 80 MG tablet Take 1 tablet (80 mg total) by mouth daily at 6 PM. 08/06/17   Strader, [place] M, PA-C  carvedilol (COREG) 6.25 MG tablet Take 1.5 tablets (9.375 mg total) by mouth 2 (two) times daily with a meal. Patient taking differently: Take 6.25 mg by mouth 2 (two) times daily with a meal.  01/31/18   Laurey Morale, MD  Colchicine 0.6 MG CAPS Take 0.6 mg by mouth daily for 5 days. Patient not taking: Reported on 02/08/2018 01/31/18 02/08/18  Laurey Morale, MD  sacubitril-valsartan (ENTRESTO) 24-26 MG Take 1 tablet by mouth 2 (two) times daily. 12/22/17   Laurey Morale, MD  spironolactone (ALDACTONE) 25 MG tablet Take 0.5 tablets (12.5 mg total) by mouth daily. 12/22/17   Laurey Morale, MD  ticagrelor (BRILINTA) 90 MG TABS tablet Take 90 mg by mouth 2 (two) times daily.    [provider]  traMADol (ULTRAM) 50 MG tablet Take 1 tablet (50 mg total) by mouth at bedtime as needed for moderate pain. Patient not taking: Reported on  02/08/2018 08/31/17   Bensimhon, Bevelyn Bucklesaniel R, MD    Family History No family history on file.  Social History Social History   Tobacco Use  . Smoking status: Never Smoker  . Smokeless tobacco: Never Used  Substance Use Topics  . Alcohol use: Not Currently    Comment: unknown  . Drug use: Never     Allergies   Patient has no known allergies.   Review of Systems Review of Systems  Constitutional: Negative for appetite change, chills and fever.  Respiratory:  Negative.  Negative for shortness of breath.   Cardiovascular: Negative.  Negative for chest pain.  Gastrointestinal: Positive for abdominal pain. Negative for diarrhea, nausea and vomiting.  Genitourinary: Negative.   Musculoskeletal: Negative.   Neurological: Negative.      Physical Exam Updated Vital Signs BP 132/90 (BP Location: Right Arm)   Pulse 72   Temp 98.9 F (37.2 C) (Oral)   Resp 14   SpO2 98%   Physical Exam  Constitutional: He appears well-developed and well-nourished.  HENT:  Head: Normocephalic.  Neck: Normal range of motion. Neck supple.  Cardiovascular: Normal rate and regular rhythm.  Pulmonary/Chest: Effort normal and breath sounds normal.  Abdominal: Soft. Bowel sounds are normal. There is tenderness in the right lower quadrant. There is no guarding.  Tenderness focal to RLQ with rebound. No mass, distention.   Musculoskeletal: Normal range of motion.  Neurological: He is alert. No cranial nerve deficit.  Skin: Skin is warm and dry. No rash noted.  Psychiatric: He has a normal mood and affect.  Nursing note and vitals reviewed.    ED Treatments / Results  Labs (all labs ordered are listed, but only abnormal results are displayed) Labs Reviewed - No data to display  EKG None  Radiology No results found.  Procedures Procedures (including critical care time)  Medications Ordered in ED Medications - No data to display   Initial Impression / Assessment and Plan / ED Course  I have reviewed the triage vital signs and the nursing notes.  Pertinent labs & imaging results that were available during my care of the patient were reviewed by me and considered in my medical decision making (see chart for details).     Patient to ED with RLQ pain similar to previous diagnosis of appendicitis which was treated with antibiotics and resolved without surgery. No N, V.   Pain managed in ED. There is CT evidence of recurrent, uncomplicated appendicitis.  Patient updated on diagnosis. Surgery paged to discuss admission.  Discussed with Dr. Derrell Lollingamirez (gen surg) who will see the patient in consultation. He will need to be cleared from a cardiac standpoint prior to any consideration of surgery, so he will need medicine to admit for coordination of care. Will not hold Brilinta for now. Zosyn started.   Cardiology consulted and will see the patient to assist in care.   Hospitalist paged to manage the admission.  Final Clinical Impressions(s) / ED Diagnoses   Final diagnoses:  None   1. Acute appendicitis  ED Discharge Orders    None       Elpidio AnisUpstill, Imogene Gravelle, Cordelia Poche-C 02/15/18 [id number]    Zadie RhineWickline, Donald, MD 02/15/18 [phone]

## 2018-02-15 NOTE — Consult Note (Signed)
Reason for Consult: Abdominal pain Referring Physician: Dr. Erick Blinks Snider is an 42 y.o. male.  HPI: Patient is a 42 year old male with a history of CAD, MI, who comes in with recurrent appendicitis.  Patient was recently in the hospital in August of this year secondary appendicitis.  Patient was on Brilinta and secondary to that underwent cardiac evaluation prior to surgery.  Patient was treated with antibiotics in the meantime and significantly improved.  At that time it was decided to continue with medical management of his appendicitis.  Patient states the pain began yesterday.  He states it was localized to the right lower quadrant initially.  He states this continued in that area.  Patient denies any nausea, vomiting, diarrhea.  Secondary to continued pain he presented to the ER for further evaluation and management.  Upon evaluation the ER he underwent CT scan laboratory studies.  CT scan did reveal signs consistent with acute appendicitis, no perforation or abscess.  I did review this personally.  Laboratory studies do reveal WBC count of 10.6.  General surgery was then consulted for further evaluation and management.   Of note patient states his last Brilinta was yesterday.  Past Medical History:  Diagnosis Date  . MI (myocardial infarction) [facility])     Past Surgical History:  Procedure Laterality Date  . CORONARY/GRAFT ACUTE MI REVASCULARIZATION N/A 07/30/2017   Procedure: Coronary/Graft Acute MI Revascularization;  Surgeon: Charolette Forward, MD;  Location: [place] [facility];  Service: Cardiovascular;  Laterality: N/A;  . LEFT HEART CATH AND CORONARY ANGIOGRAPHY N/A 07/30/2017   Procedure: LEFT HEART CATH AND CORONARY ANGIOGRAPHY;  Surgeon: Charolette Forward, MD;  Location: [place] [facility];  Service: Cardiovascular;  Laterality: N/A;  . PVC ABLATION N/A 02/10/2018   Procedure: PVC ABLATION;  Surgeon: Constance Haw, MD;  Location: [place] [facility];  Service:  Cardiovascular;  Laterality: N/A;    No family history on file.  Social History:  reports that he has never smoked. He has never used smokeless tobacco. He reports that he drank alcohol. He reports that he does not use drugs.  Allergies: No Known Allergies  Medications: I have reviewed the patient's current medications.  Results for orders placed or performed during the hospital encounter of 02/15/18 (from the past 48 hour(s))  CBC with Differential     Status: Abnormal   Collection Time: 02/15/18  3:43 AM  Result Value Ref Range   WBC 10.6 (H) 4.0 - 10.5 K/uL   RBC 5.11 4.22 - 5.81 MIL/uL   Hemoglobin 14.9 13.0 - 17.0 g/dL   HCT 46.8 39.0 - 52.0 %   MCV 91.6 80.0 - 100.0 fL   MCH 29.2 26.0 - 34.0 pg   MCHC 31.8 30.0 - 36.0 g/dL   RDW 14.0 11.5 - 15.5 %   Platelets 275 150 - 400 K/uL   nRBC 0.0 0.0 - 0.2 %   Neutrophils Relative % 76 %   Neutro Abs 8.1 (H) 1.7 - 7.7 K/uL   Lymphocytes Relative 15 %   Lymphs Abs 1.5 0.7 - 4.0 K/uL   Monocytes Relative 5 %   Monocytes Absolute 0.6 0.1 - 1.0 K/uL   Eosinophils Relative 3 %   Eosinophils Absolute 0.3 0.0 - 0.5 K/uL   Basophils Relative 1 %   Basophils Absolute 0.1 0.0 - 0.1 K/uL   Immature Granulocytes 0 %   Abs Immature Granulocytes 0.02 0.00 - 0.07 K/uL    Comment: Performed at [facility]  Hospital Lab, [place] [address] [place], [place] [address]  Comprehensive metabolic panel     Status: Abnormal   Collection Time: 02/15/18  3:43 AM  Result Value Ref Range   Sodium 136 135 - 145 mmol/L   Potassium 4.2 3.5 - 5.1 mmol/L   Chloride 103 98 - 111 mmol/L   CO2 23 22 - 32 mmol/L   Glucose, Bld 110 (H) 70 - 99 mg/dL   BUN 15 6 - 20 mg/dL   Creatinine, Ser 1.25 (H) 0.61 - 1.24 mg/dL   Calcium 9.7 8.9 - 10.3 mg/dL   Total Protein 8.1 6.5 - 8.1 g/dL   Albumin 4.5 3.5 - 5.0 g/dL   AST 22 15 - 41 U/L   ALT 36 0 - 44 U/L   Alkaline Phosphatase 57 38 - 126 U/L   Total Bilirubin 1.2 0.3 - 1.2 mg/dL   GFR calc non Af Amer >60 >60 mL/min    GFR calc Af Amer >60 >60 mL/min    Comment: (NOTE) The eGFR has been calculated using the CKD EPI equation. This calculation has not been validated in all clinical situations. eGFR's persistently <60 mL/min signify possible Chronic Kidney Disease.    Anion gap 10 5 - 15    Comment: Performed at [facility] [address] [place], [place] [address]  Urinalysis, Routine w reflex microscopic     Status: Abnormal   Collection Time: 02/15/18  3:43 AM  Result Value Ref Range   Color, Urine YELLOW YELLOW   APPearance CLEAR CLEAR   Specific Gravity, Urine 1.023 1.005 - 1.030   pH 5.0 5.0 - 8.0   Glucose, UA NEGATIVE NEGATIVE mg/dL   Hgb urine dipstick SMALL (A) NEGATIVE   Bilirubin Urine NEGATIVE NEGATIVE   Ketones, ur 5 (A) NEGATIVE mg/dL   Protein, ur NEGATIVE NEGATIVE mg/dL   Nitrite NEGATIVE NEGATIVE   Leukocytes, UA NEGATIVE NEGATIVE   RBC / HPF 0-5 0 - 5 RBC/hpf   WBC, UA 0-5 0 - 5 WBC/hpf   Bacteria, UA NONE SEEN NONE SEEN   Squamous Epithelial / LPF 0-5 0 - 5   Mucus PRESENT     Comment: Performed at [facility], [place] [address] [place], [place] [address]    Ct Abdomen Pelvis W Contrast  Result Date: 02/15/2018 CLINICAL DATA:  42 y/o  M; abdominal pain with nausea. EXAM: CT ABDOMEN AND PELVIS WITH CONTRAST TECHNIQUE: Multidetector CT imaging of the abdomen and pelvis was performed using the standard protocol following bolus administration of intravenous contrast. CONTRAST:  147m OMNIPAQUE IOHEXOL 300 MG/ML  SOLN COMPARISON:  11/11/2017 CT abdomen pelvis. FINDINGS: Lower chest: No acute abnormality. Hepatobiliary: No focal liver abnormality is seen. No gallstones, gallbladder wall thickening, or biliary dilatation. Pancreas: Unremarkable. No pancreatic ductal dilatation or surrounding inflammatory changes. Spleen: Normal in size without focal abnormality. Adrenals/Urinary Tract: Adrenal glands are unremarkable. Kidneys are normal, without renal calculi, focal  lesion, or hydronephrosis. Bladder is unremarkable. Stomach/Bowel: Sigmoid diverticulosis. Normal appearance of the stomach. No obstructive or inflammatory changes of the small bowel and colon. Acute appendicitis. Appendix: Location: Inferior to cecum. Diameter: 13 mm Appendicolith: Negative. Mucosal hyper-enhancement: Positive. Extraluminal gas: Negative. Periappendiceal collection: Periappendiceal inflammation without abscess. Vascular/Lymphatic: No significant vascular findings are present. No enlarged abdominal or pelvic lymph nodes. Reproductive: Prostate is unremarkable. Other: Small stable periumbilical hernia containing fat. No abdominopelvic ascites. Musculoskeletal: No acute or significant osseous findings. IMPRESSION: 1. Acute appendicitis.  No findings of perforation  or abscess. 2. Sigmoid diverticulosis, no evidence of acute diverticulitis. Electronically Signed   By: Kristine Garbe M.D.   On: 02/15/2018 05:33    Review of Systems  Constitutional: Negative for chills, fever and malaise/fatigue.  HENT: Negative for ear discharge, hearing loss and sore throat.   Eyes: Negative for blurred vision and discharge.  Respiratory: Negative for cough and shortness of breath.   Cardiovascular: Negative for chest pain, orthopnea and leg swelling.  Gastrointestinal: Positive for abdominal pain. Negative for constipation, diarrhea, heartburn, nausea and vomiting.  Musculoskeletal: Negative for myalgias and neck pain.  Skin: Negative for itching and rash.  Neurological: Negative for dizziness, focal weakness, seizures and loss of consciousness.  Endo/Heme/Allergies: Negative for environmental allergies. Does not bruise/bleed easily.  Psychiatric/Behavioral: Negative for depression and suicidal ideas.  All other systems reviewed and are negative.  Blood pressure (!) 105/59, pulse 62, temperature 98.9 F (37.2 C), temperature source Oral, resp. rate 12, SpO2 98 %. Physical Exam   Constitutional: He is oriented to person, place, and time. Vital signs are normal. He appears well-developed and well-nourished.  Conversant No acute distress  Eyes: Lids are normal. No scleral icterus.  No lid lag Moist conjunctiva  Neck: No tracheal tenderness present. No thyromegaly present.  No cervical lymphadenopathy  Cardiovascular: Normal rate, regular rhythm and intact distal pulses.  No murmur heard. Respiratory: Effort normal and breath sounds normal. He has no wheezes. He has no rales.  GI: Soft. Bowel sounds are normal. He exhibits no distension. There is no hepatosplenomegaly. There is tenderness (RLQ). There is no rebound and no guarding. No hernia.  Neurological: He is alert and oriented to person, place, and time.  Normal gait and station  Skin: Skin is warm. No rash noted. No cyanosis. Nails show no clubbing.  Normal skin turgor  Psychiatric: Judgment normal.  Appropriate affect    Assessment/Plan: 42 year old male with a history of MI, CAD, stent placement-on Brilinta Acute appendicitis  1.  Would recommend medical admission and cardiac evaluation and clearance prior to surgery.  Patient will need to be off his Brilinta  prior to surgery if okay with cardiology. 2.  Patient on Zosyn.  We will plan laparoscopic appendectomy later this week by Dr. Georgette Dover. 3.  We will follow along.  Ralene Ok 02/15/2018, 6:49 AM

## 2018-02-15 NOTE — ED Triage Notes (Signed)
Patient here with abdominal pain.  Patient states that he had an appy work up in July and was sent home with antibiotics.  Patient states that he is having nausea, no vomiting, pain, chills.  Patient states that last month he had an ablation for SVT and in May he had a cardiac arrest STEMI.

## 2018-02-15 NOTE — ED Notes (Signed)
Admitting made aware EKG was completed.

## 2018-02-15 NOTE — ED Notes (Signed)
ED Provider at bedside. 

## 2018-02-15 NOTE — ED Notes (Signed)
Patient denies pain and is resting comfortably.  

## 2018-02-16 ENCOUNTER — Telehealth (HOSPITAL_COMMUNITY): Payer: Self-pay

## 2018-02-16 DIAGNOSIS — I2510 Atherosclerotic heart disease of native coronary artery without angina pectoris: Secondary | ICD-10-CM

## 2018-02-16 DIAGNOSIS — Z9861 Coronary angioplasty status: Secondary | ICD-10-CM

## 2018-02-16 DIAGNOSIS — K3580 Unspecified acute appendicitis: Principal | ICD-10-CM

## 2018-02-16 LAB — MAGNESIUM: Magnesium: 2 mg/dL (ref 1.7–2.4)

## 2018-02-16 LAB — BASIC METABOLIC PANEL
Anion gap: 7 (ref 5–15)
BUN: 9 mg/dL (ref 6–20)
CO2: 25 mmol/L (ref 22–32)
Calcium: 8.8 mg/dL — ABNORMAL LOW (ref 8.9–10.3)
Chloride: 107 mmol/L (ref 98–111)
Creatinine, Ser: 1.28 mg/dL — ABNORMAL HIGH (ref 0.61–1.24)
GFR calc Af Amer: >60 mL/min (ref >60)
GLUCOSE: 100 mg/dL — AB (ref 70–99)
POTASSIUM: 3.8 mmol/L (ref 3.5–5.1)
SODIUM: 139 mmol/L (ref 135–145)

## 2018-02-16 LAB — CBC
HEMATOCRIT: 41.8 % (ref 39.0–52.0)
HEMOGLOBIN: 13.4 g/dL (ref 13.0–17.0)
MCH: 29.5 pg (ref 26.0–34.0)
MCHC: 32.1 g/dL (ref 30.0–36.0)
MCV: 92.1 fL (ref 80.0–100.0)
Platelets: 231 10*3/uL (ref 150–400)
RBC: 4.54 MIL/uL (ref 4.22–5.81)
RDW: 14.2 % (ref 11.5–15.5)
WBC: 6.6 10*3/uL (ref 4.0–10.5)
nRBC: 0 % (ref 0.0–0.2)

## 2018-02-16 LAB — PHOSPHORUS: Phosphorus: 3.8 mg/dL (ref 2.5–4.6)

## 2018-02-16 LAB — GLUCOSE, CAPILLARY: GLUCOSE-CAPILLARY: 89 mg/dL (ref 70–99)

## 2018-02-16 LAB — PLATELET INHIBITION P2Y12: PLATELET FUNCTION P2Y12: 3 [PRU] — AB (ref 194–418)

## 2018-02-16 NOTE — Progress Notes (Signed)
Subjective: Feeling better but still has right lower quadrant pain.  No nausea or vomiting According to patient, last Brilinta dose was 7 PM, Tuesday, November 19  Appreciate cardiology evaluation. MI with stent placement 5/19; chronic systolic heart failure EF 45 to 50%.  Labs show WBC 6.6.  Hemoglobin 13.4.  Glucose 100.  Creatinine 1.28.  BUN 9.  Planning laparoscopic appendectomy tomorrow if okay with cardiology    Objective: Vital signs in last 24 hours: Temp:  [97.7 F (36.5 C)-98.7 F (37.1 C)] 97.9 F (36.6 C) (11/21 0552) Pulse Rate:  [54-70] 54 (11/21 0552) Resp:  [12-20] 16 (11/21 0552) BP: (91-120)/(55-82) 111/82 (11/21 0552) SpO2:  [98 %-100 %] 100 % (11/21 0552) Weight:  [95.1 kg-95.7 kg] 95.1 kg (11/21 0500) Last BM Date: 02/14/18  Intake/Output from previous day: 11/20 0701 - 11/21 0700 In: 1824.2 [P.O.:822; I.V.:952.2; IV Piggyback:50] Out: -  Intake/Output this shift: No intake/output data recorded.  General appearance: Alert.  Oriented.  Excellent insight.  No distress Resp: clear to auscultation bilaterally GI: Soft.  Tender with mild guarding right lower quadrant.  Very localized.  May have small umbilical hernia  Lab Results:  Recent Labs    02/15/18 0343 02/16/18 0252  WBC 10.6* 6.6  HGB 14.9 13.4  HCT 46.8 41.8  PLT 275 231   BMET Recent Labs    02/15/18 0343 02/16/18 0252  NA 136 139  K 4.2 3.8  CL 103 107  CO2 23 25  GLUCOSE 110* 100*  BUN 15 9  CREATININE 1.25* 1.28*  CALCIUM 9.7 8.8*   PT/INR No results for input(s): LABPROT, INR in the last 72 hours. ABG No results for input(s): PHART, HCO3 in the last 72 hours.  Invalid input(s): PCO2, PO2  Studies/Results: Ct Abdomen Pelvis W Contrast  Result Date: 02/15/2018 CLINICAL DATA:  42 y/o  M; abdominal pain with nausea. EXAM: CT ABDOMEN AND PELVIS WITH CONTRAST TECHNIQUE: Multidetector CT imaging of the abdomen and pelvis was performed using the standard protocol  following bolus administration of intravenous contrast. CONTRAST:  OMNIPAQUE IOHEXOL 300 MG/ML  SOLN COMPARISON:  11/11/2017 CT abdomen pelvis. FINDINGS: Lower chest: No acute abnormality. Hepatobiliary: No focal liver abnormality is seen. No gallstones, gallbladder wall thickening, or biliary dilatation. Pancreas: Unremarkable. No pancreatic ductal dilatation or surrounding inflammatory changes. Spleen: Normal in size without focal abnormality. Adrenals/Urinary Tract: Adrenal glands are unremarkable. Kidneys are normal, without renal calculi, focal lesion, or hydronephrosis. Bladder is unremarkable. Stomach/Bowel: Sigmoid diverticulosis. Normal appearance of the stomach. No obstructive or inflammatory changes of the small bowel and colon. Acute appendicitis. Appendix: Location: Inferior to cecum. Diameter: 13 mm Appendicolith: Negative. Mucosal hyper-enhancement: Positive. Extraluminal gas: Negative. Periappendiceal collection: Periappendiceal inflammation without abscess. Vascular/Lymphatic: No significant vascular findings are present. No enlarged abdominal or pelvic lymph nodes. Reproductive: Prostate is unremarkable. Other: Small stable periumbilical hernia containing fat. No abdominopelvic ascites. Musculoskeletal: No acute or significant osseous findings. IMPRESSION: 1. Acute appendicitis.  No findings of perforation or abscess. 2. Sigmoid diverticulosis, no evidence of acute diverticulitis. Electronically Signed   By: Mitzi Hansen M.D.   On: 02/15/2018 05:33    Anti-infectives: Anti-infectives (From admission, onward)   Start     Dose/Rate Route Frequency Ordered Stop   02/15/18 0645  piperacillin-tazobactam (ZOSYN) IVPB 3.375 g     3.375 g 100 mL/hr over 30 Minutes Intravenous  Once 02/15/18 1610 02/15/18 0827      Assessment/Plan:  Acute appendicitis without perforation or abscess.  Recommend laparoscopic appendectomy  when stable from cardiac standpoint  Plan laparoscopic  appendectomy tomorrow, Friday, if okay with cardiology.   Dr. Corliss Skainssuei will be off tomorrow so I will perform the surgery P2Y12 level pending Continue Zosyn  History MI, CAD, systolic CHF, stent placement, on Brilinta In May 2019 had pulseless VT with CPR hypothermia protocol.  Stents placed.  Did well.  I have discussed the indications, details, techniques, and numerous risk of appendectomy with the patient.  He is aware of the risk of bleeding, infection, conversion to open laparotomy, injury to adjacent organs, alternative diagnosis, cardiac pulmonary and thromboembolic problems.  He understands all of these issues.  All his questions were answered.  He agrees with this plan.  LOS: 1 day    Ernestene MentionHaywood M Shogo Larkey 02/16/2018

## 2018-02-16 NOTE — Progress Notes (Addendum)
Advanced Heart Failure Rounding Note  PCP-Cardiologist: No primary care provider on file.   Subjective:    Feels ok. Denies SOB/chest pain. Complaining of abdominal pain.     Objective:   Weight Range: 95.1 kg Body mass index is 30.96 kg/m.   Vital Signs:   Temp:  [97.9 F (36.6 C)-98.7 F (37.1 C)] 97.9 F (36.6 C) (11/21 0552) Pulse Rate:  [54-63] 54 (11/21 0552) Resp:  [16-18] 16 (11/21 0552) BP: (92-120)/(55-82) 111/82 (11/21 0552) SpO2:  [98 %-100 %] 100 % (11/21 0552) Weight:  [95.1 kg-95.7 kg] 95.1 kg (11/21 0500) Last BM Date: 02/14/18  Weight change: Filed Weights   02/15/18 1841 02/16/18 0500  Weight: 95.7 kg 95.1 kg    Intake/Output:   Intake/Output Summary (Last 24 hours) at 02/16/2018 1205 Last data filed at 02/16/2018 0927 Gross per 24 hour  Intake 2074.21 ml  Output -  Net 2074.21 ml      Physical Exam    General:  Sitting in the chair. No resp difficulty HEENT: Normal Neck: Supple. JVP flat . Carotids 2+ bilat; no bruits. No lymphadenopathy or thyromegaly appreciated. Cor: PMI nondisplaced. Regular rate & rhythm. No rubs, gallops or murmurs. Lungs: Clear Abdomen: Soft, nontender, nondistended. No hepatosplenomegaly. No bruits or masses. Good bowel sounds. Extremities: No cyanosis, clubbing, rash, edema Neuro: Alert & orientedx3, cranial nerves grossly intact. moves all 4 extremities w/o difficulty. Affect pleasant   Telemetry   NSR 60s frequent PVCs.   EKG    N/A  Labs    CBC Recent Labs    02/15/18 0343 02/16/18 0252  WBC 10.6* 6.6  NEUTROABS 8.1*  --   HGB 14.9 13.4  HCT 46.8 41.8  MCV 91.6 92.1  PLT 275 231   Basic Metabolic Panel Recent Labs    16/10/96 0338 02/15/18 0343 02/16/18 0252  NA  --  136 139  K  --  4.2 3.8  CL  --  103 107  CO2  --  23 25  GLUCOSE  --  110* 100*  BUN  --  15 9  CREATININE  --  1.25* 1.28*  CALCIUM  --  9.7 8.8*  MG 2.0  --   --    Liver Function Tests Recent Labs   02/15/18 0343  AST 22  ALT 36  ALKPHOS 57  BILITOT 1.2  PROT 8.1  ALBUMIN 4.5   No results for input(s): LIPASE, AMYLASE in the last 72 hours. Cardiac Enzymes No results for input(s): CKTOTAL, CKMB, CKMBINDEX, TROPONINI in the last 72 hours.  BNP: BNP (last 3 results) No results for input(s): BNP in the last 8760 hours.  ProBNP (last 3 results) No results for input(s): PROBNP in the last 8760 hours.   D-Dimer No results for input(s): DDIMER in the last 72 hours. Hemoglobin A1C No results for input(s): HGBA1C in the last 72 hours. Fasting Lipid Panel No results for input(s): CHOL, HDL, LDLCALC, TRIG, CHOLHDL, LDLDIRECT in the last 72 hours. Thyroid Function Tests No results for input(s): TSH, T4TOTAL, T3FREE, THYROIDAB in the last 72 hours.  Invalid input(s): FREET3  Other results:   Imaging     No results found.   Medications:     Scheduled Medications: . amiodarone  200 mg Oral Daily  . aspirin EC  81 mg Oral Daily  . atorvastatin  80 mg Oral q1800  . carvedilol  6.25 mg Oral BID WC  . enoxaparin (LOVENOX) injection  40 mg Subcutaneous Q24H  .  sacubitril-valsartan  1 tablet Oral BID  . spironolactone  12.5 mg Oral Daily     Infusions: . sodium chloride 75 mL/hr at 02/16/18 0350     PRN Medications:  acetaminophen **OR** acetaminophen, HYDROcodone-acetaminophen, morphine injection, ondansetron **OR** ondansetron (ZOFRAN) IV, polyethylene glycol    Patient Profile   [name] is 42 year old with history of CAD, MI 2017 with 5 stents RCA, inferior MI STEMI 2019, VT, and PVCs with an ablation earlier this month.   Admitted today for acute appendicitis.  Assessment/Plan   1. Acute Appendicitis CT showed acute appendicitis. Surgery recommending laparoscopic appendectomy.  - Brillinta stopped.   -Ok to proceed with surgery. He is low risk  for surgery. -Plan surgery tomorrow.    2. CAD Dating back to 2017 with [facility].  He has  inferior MI 07/2017 with DES to proximal/mid RCA but distal RCA and PLV/PDA remained occluded. Lexiscan 12/2017 with inferior scar.  -No s/s ischemia.  -He is off brillinta. Last dose 11/19. P2Y12 level was 3.  - Continue aspirin and atorvastatin.   3. Chronic Systolic Heart Failure ECHO 12/2017 EF 45-50%. Volume status stable.  Continue entresto, carveidlol, and spironolactone at current dose.  Watch bp closely.   4. 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 Continue amiodarone 200 mg daily.  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   5. H/O VT  07/2017 with pulseless VR with CPR and hypothermia protocol    Length of Stay: 1  Lee Moore, Lee Moore  02/16/2018, 12:05 PM  Advanced Heart Failure Team Pager [phone] (M-F; 7a - 4p)  Please contact [facility] for night-coverage after hours (4p -7a ) and weekends on [email or web address]  Patient seen with Lee Moore, agree with the above note.   He is feeling ok, still with abdominal soreness.  Afebrile. He is on his cardiac meds with the exception of Brilinta (held since Tuesday).  He has PVCs on monitor but only about 20/hr.    His P2Y12 level was 3, this suggests very low platelet function (maximal inhibition still).  Will resend this in the morning.  Will need to discuss timing of surgery (which he needs) with Dr. Derrell LollingIngram. If he remains stable, may help to wait an additional day.   Lee Moore 02/16/2018 1:55 PM

## 2018-02-16 NOTE — Progress Notes (Addendum)
PROGRESS NOTE  Lee Moore ZOX:[id number] DOB: 14-Nov-1975 DOA: 02/15/2018 PCP: Patient, No Pcp Per  HPI/Recap of past 24 hours: Lee Moore is a 42 y.o. 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  Patient was last admitted for similar presentation on 10/2017 found to have acute appendicitis which was managed conservatively with pain control and Augmentin course by surgery.  Patient states this abdominal pain was very similar to that, denies any nausea, vomiting, chest pain, constipation or diarrhea.  Denies any fevers or chills.  Regarding his chronic CHF with reduced EF and CAD status post DES to RCA (07/2017).  Patient has not had any symptoms of chest pain or worsening shortness of breath.  He denies any significant swelling, PND or orthopnea.  He has been adherent to his medication regimen.  For his frequent PVCs patient underwent recent ablation 11/15 without complications.  [facility] hospitalist was called to admit in addition to obtaining cardiac clearance before any procedure by her surgical consultants.  02/16/18: Denies chest pain, palpitations or dyspnea. Has RLQ pain. No nausea. lape appendectomy planned tomorrow 02/17/18.  ADDENDUM: Still very low platelet function. Might have to wait an additional day for surgery and repeat P2Y12 level.  Assessment/Plan: Active Problems:   Cardiac arrest with ventricular fibrillation (HCC)   ST elevation myocardial infarction (STEMI) of inferior wall (HCC)   Essential hypertension   Ischemic cardiomyopathy   Acute appendicitis without peritonitis   CAD S/P  percutaneous coronary angioplasty   AKI (acute kidney injury) (HCC)   Acute appendicitis without peritonitis Cleared by cardio for surgery  Lape appendectomy planned tomorrow C/w IV antibiotics empirically zosyn Hold brilinta  CKD 2 Appears baseline cr 1.1 with GFR >60 Avoid nephrotoxic agents  Recent MI with stent placement Hold brilinta for surgery  Chronic HFREF 45-50% Cardiology following No acute issues Last TTE on 12/2016 with EF 45 to 50% (previously 35% after recent STEMI as mentioned above) disease.  Euvolemic on exam. -Cardiology recommends continuing his home Entresto, carvedilol, spironolactone at current dose -We will closely monitor blood pressure - No need for fluids currently since patient will be on clear liquid diet for now. -Daily weights, strict I's and O's  CAD status post inferior STEMI 07/2017 status post DES to RCA. Complicated by pulseless VT for which he underwent CPR and underwent hypothermia protocol during that admission. Also has history of stents after MI in 2017 [facility]) Has been on aspirin and Brilinta since then .  Reports adequate exercise tolerance able to go up 4 flights of steps without significant dyspnea or CP. - Hold Brilinta given upcoming procedure -Continue aspirin as recommended by cardiology -Awaiting further recommendations from cardiology given stent placed 6 months ago  DVT prophylaxis: sq lovenox daily  Code Status: Full code   Family Communication:  No family at bedside   Disposition Plan: Home when gen surgery signs off  Consults called: Cardiology and Surgery     Objective: Vitals:   02/15/18 2116 02/16/18 0500 02/16/18 0552 02/16/18 1400  BP: 120/63  111/82 114/78  Pulse: 63  (!) 54 68  Resp: 16  16 17   Temp:  98.3 F (36.8 C)  97.9 F (36.6 C) 98.1 F (36.7 C)  TempSrc: Oral  Oral Oral  SpO2: 100%  100% 100%  Weight:  95.1 kg    Height:        Intake/Output Summary (Last 24 hours) at 02/16/2018  1603 Last data filed at 02/16/2018 1359 Gross per 24 hour  Intake 2159.38 ml  Output -  Net 2159.38 ml   Filed Weights   02/15/18 1841 02/16/18 0500  Weight: 95.7 kg 95.1 kg    Exam:  . General: 42 y.o. year-old male well developed well nourished in no acute distress.  Alert and oriented x3. . Cardiovascular: Regular rate and rhythm with no rubs or gallops.  No thyromegaly or JVD noted.   Marland Kitchen Respiratory: Clear to auscultation with no wheezes or rales. Good inspiratory effort. . Abdomen: Soft; RLQ mildly tender on palpation, nondistended with normal bowel sounds x4 quadrants. . Musculoskeletal: No lower extremity edema. 2/4 pulses in all 4 extremities. . Skin: No ulcerative lesions noted or rashes . Psychiatry: Mood is appropriate for condition and setting   Data Reviewed: CBC: Recent Labs  Lab 02/10/18 0654 02/15/18 0343 02/16/18 0252  WBC 5.1 10.6* 6.6  NEUTROABS  --  8.1*  --   HGB 14.7 14.9 13.4  HCT 45.8 46.8 41.8  MCV 92.3 91.6 92.1  PLT 270 275 231   Basic Metabolic Panel: Recent Labs  Lab 02/15/18 0338 02/15/18 0343 02/16/18 0252  NA  --  136 139  K  --  4.2 3.8  CL  --  103 107  CO2  --  23 25  GLUCOSE  --  110* 100*  BUN  --  15 9  CREATININE  --  1.25* 1.28*  CALCIUM  --  9.7 8.8*  MG 2.0  --   --    GFR: Estimated Creatinine Clearance: 85.6 mL/min (A) (by C-G formula based on SCr of 1.28 mg/dL (H)). Liver Function Tests: Recent Labs  Lab 02/15/18 0343  AST 22  ALT 36  ALKPHOS 57  BILITOT 1.2  PROT 8.1  ALBUMIN 4.5   No results for input(s): LIPASE, AMYLASE in the last 168 hours. No results for input(s): AMMONIA in the last 168 hours. Coagulation Profile: No results for input(s): INR, PROTIME in the last 168 hours. Cardiac Enzymes: No results for input(s): CKTOTAL, CKMB, CKMBINDEX, TROPONINI in the last 168 hours. BNP (last 3 results) No results for input(s): PROBNP in the last 8760 hours. HbA1C: No results for input(s): HGBA1C in  the last 72 hours. CBG: Recent Labs  Lab 02/16/18 0820  GLUCAP 89   Lipid Profile: No results for input(s): CHOL, HDL, LDLCALC, TRIG, CHOLHDL, LDLDIRECT in the last 72 hours. Thyroid Function Tests: No results for input(s): TSH, T4TOTAL, FREET4, T3FREE, THYROIDAB in the last 72 hours. Anemia Panel: No results for input(s): VITAMINB12, FOLATE, FERRITIN, TIBC, IRON, RETICCTPCT in the last 72 hours. Urine analysis:    Component Value Date/Time   COLORURINE YELLOW 02/15/2018 0343   APPEARANCEUR CLEAR 02/15/2018 0343   LABSPEC 1.023 02/15/2018 0343   PHURINE 5.0 02/15/2018 0343   GLUCOSEU NEGATIVE 02/15/2018 0343   HGBUR SMALL (A) 02/15/2018 0343   BILIRUBINUR NEGATIVE 02/15/2018 0343   KETONESUR 5 (A) 02/15/2018 0343   PROTEINUR NEGATIVE 02/15/2018 0343   NITRITE NEGATIVE 02/15/2018 0343   LEUKOCYTESUR NEGATIVE 02/15/2018 0343   Sepsis Labs: @LABRCNTIP (procalcitonin:4,lacticidven:4)  )No results found for this or any previous visit (from the past 240 hour(s)).  Studies: No results found.  Scheduled Meds: . amiodarone  200 mg Oral Daily  . aspirin EC  81 mg Oral Daily  . atorvastatin  80 mg Oral q1800  . carvedilol  6.25 mg Oral BID WC  . enoxaparin (LOVENOX) injection  40 mg Subcutaneous Q24H  . sacubitril-valsartan  1 tablet Oral BID  . spironolactone  12.5 mg Oral Daily    Continuous Infusions:   LOS: 1 day     Darlin Droparole N Hall, MD [facility] Pager [phone]  If 7PM-7AM, please contact night-coverage [email or web address] Password [facility] 02/16/2018, 4:03 PM

## 2018-02-16 NOTE — Telephone Encounter (Signed)
Attempted to contact patient in regards to Cardiac Rehab - lm on vm °

## 2018-02-17 ENCOUNTER — Encounter (HOSPITAL_COMMUNITY): Payer: Self-pay | Admitting: Certified Registered Nurse Anesthetist

## 2018-02-17 ENCOUNTER — Encounter (HOSPITAL_COMMUNITY)
Admission: EM | Disposition: A | Discharge status: Home / Self Care | Payer: Self-pay | Source: Home / Self Care | Attending: Internal Medicine

## 2018-02-17 LAB — CBC WITH DIFFERENTIAL/PLATELET
Abs Immature Granulocytes: 0.01 10*3/uL (ref 0.00–0.07)
BASOS ABS: 0 10*3/uL (ref 0.0–0.1)
Basophils Relative: 0 %
EOS PCT: 6 %
Eosinophils Absolute: 0.3 10*3/uL (ref 0.0–0.5)
HEMATOCRIT: 40.5 % (ref 39.0–52.0)
HEMOGLOBIN: 13.2 g/dL (ref 13.0–17.0)
Immature Granulocytes: 0 %
Lymphocytes Relative: 46 %
Lymphs Abs: 2.4 10*3/uL (ref 0.7–4.0)
MCH: 29.8 pg (ref 26.0–34.0)
MCHC: 32.6 g/dL (ref 30.0–36.0)
MCV: 91.4 fL (ref 80.0–100.0)
MONO ABS: 0.4 10*3/uL (ref 0.1–1.0)
MONOS PCT: 7 %
NRBC: 0 % (ref 0.0–0.2)
Neutro Abs: 2.1 10*3/uL (ref 1.7–7.7)
Neutrophils Relative %: 41 %
Platelets: 227 10*3/uL (ref 150–400)
RBC: 4.43 MIL/uL (ref 4.22–5.81)
RDW: 14 % (ref 11.5–15.5)
WBC: 5.2 10*3/uL (ref 4.0–10.5)

## 2018-02-17 LAB — BASIC METABOLIC PANEL
Anion gap: 8 (ref 5–15)
BUN: 7 mg/dL (ref 6–20)
CHLORIDE: 108 mmol/L (ref 98–111)
CO2: 23 mmol/L (ref 22–32)
CREATININE: 1.1 mg/dL (ref 0.61–1.24)
Calcium: 8.9 mg/dL (ref 8.9–10.3)
GFR calc Af Amer: >60 mL/min (ref >60)
GFR calc non Af Amer: >60 mL/min (ref >60)
GLUCOSE: 104 mg/dL — AB (ref 70–99)
Potassium: 3.9 mmol/L (ref 3.5–5.1)
Sodium: 139 mmol/L (ref 135–145)

## 2018-02-17 LAB — PLATELET INHIBITION P2Y12: Platelet Function  P2Y12: 3 [PRU] — ABNORMAL LOW (ref 194–418)

## 2018-02-17 LAB — SURGICAL PCR SCREEN
MRSA, PCR: NEGATIVE
Staphylococcus aureus: NEGATIVE

## 2018-02-17 LAB — GLUCOSE, CAPILLARY: Glucose-Capillary: 86 mg/dL (ref 70–99)

## 2018-02-17 SURGERY — APPENDECTOMY, LAPAROSCOPIC
Anesthesia: General

## 2018-02-17 MED ORDER — MUPIROCIN 2 % EX OINT
1.0000 "application " | TOPICAL_OINTMENT | Freq: Two times a day (BID) | CUTANEOUS | Status: DC
  Administered 2018-02-18 (×2): 1 via NASAL
  Filled 2018-02-17: qty 22

## 2018-02-17 MED ORDER — CHLORHEXIDINE GLUCONATE CLOTH 2 % EX PADS
6.0000 | MEDICATED_PAD | Freq: Once | CUTANEOUS | Status: AC
  Administered 2018-02-17: 6 via TOPICAL

## 2018-02-17 MED ORDER — CHLORHEXIDINE GLUCONATE CLOTH 2 % EX PADS: 6.0000 | MEDICATED_PAD | Freq: Once | CUTANEOUS | Status: DC

## 2018-02-17 MED ORDER — POTASSIUM CHLORIDE 2 MEQ/ML IV SOLN
INTRAVENOUS | Status: DC
  Administered 2018-02-17 (×2): via INTRAVENOUS
  Filled 2018-02-17 (×7): qty 1000

## 2018-02-17 NOTE — Progress Notes (Signed)
PROGRESS NOTE  Lee DakinCory Hancher ZOX:[id number] DOB: 06-17-1975 DOA: 02/15/2018 PCP: Patient, No Pcp Per  HPI/Recap of past 24 hours: Lee Moore is a 42 y.o. 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  Patient was last admitted for similar presentation on 10/2017 found to have acute appendicitis which was managed conservatively with pain control and Augmentin course by surgery.  Patient states this abdominal pain was very similar to that, denies any nausea, vomiting, chest pain, constipation or diarrhea.  Denies any fevers or chills.  Regarding his chronic CHF with reduced EF and CAD status post DES to RCA (07/2017).  Patient has not had any symptoms of chest pain or worsening shortness of breath.  He denies any significant swelling, PND or orthopnea.  He has been adherent to his cardiac medication regimen.  For his frequent PVCs patient underwent recent ablation 02/10/18 without complications.  [facility] hospitalist was called to admit in addition to obtaining cardiac clearance before any procedure by general surgery.  ADDENDUM: Still very low platelet function. Might have to wait an additional day for surgery and repeat P2Y12 level.  02/17/2018: Patient seen and examined at his bedside.  No acute events overnight.  Still has right lower quadrant abdominal pain on palpation.  P2Y12 level still low.  Possible lap appendectomy on Sunday, 02/19/2018  Assessment/Plan: Active Problems:   Cardiac arrest with ventricular fibrillation (HCC)   ST elevation myocardial infarction (STEMI) of inferior wall [facility](HCC)   Essential  hypertension   Ischemic cardiomyopathy   Acute appendicitis without peritonitis   CAD S/P percutaneous coronary angioplasty   AKI (acute kidney injury) (HCC)   Acute appendicitis without peritonitis Awaiting P2Y12 level to normalize prior to surgery Lape appendectomy possibly on Sunday, 02/19/2018 C/w IV antibiotics empirically zosyn Continue to hold antiplatelets and subcu Lovenox per general surgery  CKD 2, stable Appears baseline cr 1.1 with GFR >60 Avoid nephrotoxic agents  Recent MI with stent placement Hold brilinta and aspirin for surgery  Chronic HFREF 45-50% Cardiology following No acute issues Last TTE on 12/2016 with EF 45 to 50% (previously 35% after recent STEMI as mentioned above) disease.  Euvolemic on exam. -Cardiology recommends continuing his home Entresto, carvedilol, spironolactone at current dose -We will closely monitor blood pressure - No need for fluids currently since patient will be on clear liquid diet for now. -Daily weights, strict I's and O's  CAD status post inferior STEMI 07/2017 status post DES to RCA. Complicated by pulseless VT for which he underwent CPR and underwent hypothermia protocol during that admission. Also has history of stents after MI in 2017 [facility] [place]) Has been on aspirin and Brilinta since then .  Reports adequate exercise tolerance able to go up 4 flights of steps without significant dyspnea or CP. - Hold Brilinta and aspirin per general surgery -Awaiting further recommendations from cardiology given stent placed 6 months ago  DVT prophylaxis:  SCDs.  Hold subcu Lovenox per general surgery due to possible surgery   Code Status: Full code   Family Communication:  No family at bedside   Disposition Plan: Home when gen surgery signs off  Consults called:  Cardiology and Surgery     Objective: Vitals:   02/17/18 0500 02/17/18 0533 02/17/18 0945 02/17/18 1331  BP:  108/76 103/70 (!) 107/57  Pulse:  (!) 54 72 61    Resp:  16 16 16   Temp:  97.8 F (36.6 C) 99.5 F (37.5 C) 98 F (36.7 C)  TempSrc:  Oral Oral Oral  SpO2:  100% 100% 100%  Weight: 94 kg     Height:        Intake/Output Summary (Last 24 hours) at 02/17/2018 1402 Last data filed at 02/17/2018 1331 Gross per 24 hour  Intake 870 ml  Output -  Net 870 ml   Filed Weights   02/15/18 1841 02/16/18 0500 02/17/18 0500  Weight: 95.7 kg 95.1 kg 94 kg    Exam:  . General: 42 y.o. year-old male pleasant well-developed well-nourished in no acute distress.  Alert and oriented x3.   . Cardiovascular: Regular rate and rhythm with no rubs or gallops.  No JVD or thyromegaly noted.   Marland Kitchen Respiratory: Clear to auscultation with no wheezes or rales. Good inspiratory effort. . Abdomen: Soft; mild right lower quadrant pain on palpation.  Normal bowel sounds x4 quadrant. . Musculoskeletal: No lower extremity edema. 2/4 pulses in all 4 extremities. . Skin: No ulcerative lesions noted or rashes . Psychiatry: Mood is appropriate for condition and setting   Data Reviewed: CBC: Recent Labs  Lab 02/15/18 0343 02/16/18 0252 02/17/18 0254  WBC 10.6* 6.6 5.2  NEUTROABS 8.1*  --  2.1  HGB 14.9 13.4 13.2  HCT 46.8 41.8 40.5  MCV 91.6 92.1 91.4  PLT 275 231 227   Basic Metabolic Panel: Recent Labs  Lab 02/15/18 0338 02/15/18 0343 02/16/18 0252 02/17/18 0254  NA  --  136 139 139  K  --  4.2 3.8 3.9  CL  --  103 107 108  CO2  --  23 25 23   GLUCOSE  --  110* 100* 104*  BUN  --  15 9 7   CREATININE  --  1.25* 1.28* 1.10  CALCIUM  --  9.7 8.8* 8.9  MG 2.0  --  2.0  --   PHOS  --   --  3.8  --    GFR: Estimated Creatinine Clearance: 99 mL/min (by C-G formula based on SCr of 1.1 mg/dL). Liver Function Tests: Recent Labs  Lab 02/15/18 0343  AST 22  ALT 36  ALKPHOS 57  BILITOT 1.2  PROT 8.1  ALBUMIN 4.5   No results for input(s): LIPASE, AMYLASE in the last 168 hours. No results for input(s): AMMONIA in the last 168  hours. Coagulation Profile: No results for input(s): INR, PROTIME in the last 168 hours. Cardiac Enzymes: No results for input(s): CKTOTAL, CKMB, CKMBINDEX, TROPONINI in the last 168 hours. BNP (last 3 results) No results for input(s): PROBNP in the last 8760 hours. HbA1C: No results for input(s): HGBA1C in the last 72 hours. CBG: Recent Labs  Lab 02/16/18 0820 02/17/18 0753  GLUCAP 89 86   Lipid Profile: No results for input(s): CHOL, HDL, LDLCALC, TRIG, CHOLHDL, LDLDIRECT in the last 72 hours. Thyroid Function Tests: No results for input(s): TSH, T4TOTAL, FREET4, T3FREE, THYROIDAB in the last 72 hours. Anemia Panel: No results for input(s): VITAMINB12, FOLATE, FERRITIN, TIBC, IRON, RETICCTPCT in the last 72 hours. Urine analysis:    Component Value Date/Time   COLORURINE YELLOW 02/15/2018 0343   APPEARANCEUR CLEAR 02/15/2018 0343   LABSPEC 1.023 02/15/2018 0343  PHURINE 5.0 02/15/2018 0343   GLUCOSEU NEGATIVE 02/15/2018 0343   HGBUR SMALL (A) 02/15/2018 0343   BILIRUBINUR NEGATIVE 02/15/2018 0343   KETONESUR 5 (A) 02/15/2018 0343   PROTEINUR NEGATIVE 02/15/2018 0343   NITRITE NEGATIVE 02/15/2018 0343   LEUKOCYTESUR NEGATIVE 02/15/2018 0343   Sepsis Labs: @LABRCNTIP (procalcitonin:4,lacticidven:4)  )No results found for this or any previous visit (from the past 240 hour(s)).    Studies: No results found.  Scheduled Meds: . amiodarone  200 mg Oral Daily  . atorvastatin  80 mg Oral q1800  . carvedilol  6.25 mg Oral BID WC  . Chlorhexidine Gluconate Cloth  6 each Topical Once  . sacubitril-valsartan  1 tablet Oral BID  . spironolactone  12.5 mg Oral Daily    Continuous Infusions: . lactated ringers 1,000 mL with potassium chloride 20 mEq infusion 100 mL/hr at 02/17/18 0658     LOS: 2 days     Darlin Drop, MD [facility] Pager [phone]  If 7PM-7AM, please contact night-coverage [email or web address] Password [facility] 02/17/2018, 2:02 PM

## 2018-02-17 NOTE — Progress Notes (Signed)
  Subjective: Alert.  Tolerated clear liquids yesterday.  Currently IV fluids have been stopped. He seems to be feeling better He still has an order for Lovenox and aspirin and I have stopped that Discussion with Dr. Shirlee LatchMcLean of cardiology yesterday. PGY12 level was 3, indicating maximal platelet inhibition. We have decided to delay surgery until this normalizes I told the patient this morning we might do the appendectomy Saturday or Sunday This clearly needs to be done because this is his second episode of appendicitis  Lab this morning reveals potassium 3.9.  Creatinine 1.1.  WBC 5200. P2Y12 still 2.  Profoundly low. Not sure what this means but perhaps he is not metabolizing the Brilinta very well I have stopped his aspirin and Lovenox  Objective: Vital signs in last 24 hours: Temp:  [97.8 F (36.6 C)-98.3 F (36.8 C)] 97.8 F (36.6 C) (11/22 0533) Pulse Rate:  [54-68] 54 (11/22 0533) Resp:  [16-17] 16 (11/22 0533) BP: (104-114)/(52-82) 108/76 (11/22 0533) SpO2:  [100 %] 100 % (11/22 0533) Last BM Date: 02/14/18  Intake/Output from previous day: 11/21 0701 - 11/22 0700 In: 957 [P.O.:957] Out: -  Intake/Output this shift: Total I/O In: 180 [P.O.:180] Out: -   General appearance: Alert.  No distress whatsoever.  Mental status normal excellent insight Resp: clear to auscultation bilaterally GI: Abdomen soft.  Now minimally tender right lower quadrant.  This is an improvement.  No mass.  Lab Results:  Recent Labs    02/16/18 0252 02/17/18 0254  WBC 6.6 5.2  HGB 13.4 13.2  HCT 41.8 40.5  PLT 231 227   BMET Recent Labs    02/16/18 0252 02/17/18 0254  NA 139 139  K 3.8 3.9  CL 107 108  CO2 25 23  GLUCOSE 100* 104*  BUN 9 7  CREATININE 1.28* 1.10  CALCIUM 8.8* 8.9   PT/INR No results for input(s): LABPROT, INR in the last 72 hours. ABG No results for input(s): PHART, HCO3 in the last 72 hours.  Invalid input(s): PCO2, PO2  Studies/Results: No results  found.  Anti-infectives: Anti-infectives (From admission, onward)   Start     Dose/Rate Route Frequency Ordered Stop   02/15/18 0645  piperacillin-tazobactam (ZOSYN) IVPB 3.375 g     3.375 g 100 mL/hr over 30 Minutes Intravenous  Once 02/15/18 [id number] 02/15/18 0827      Assessment/Plan: s/p Procedure(s): APPENDECTOMY LAPAROSCOPIC  Acute appendicitis without perforation or abscess.  Second episode.  First episode in August of this year treated medically.  Recommend laparoscopic appendectomy when stable from cardiac standpoint. Given significant platelet inhibition will probably hold off until Saturday or Sunday.  Patient informed He still has active order for Lovenox and aspirin which I have discontinue Continue Zosyn and IVF's  History MI, CAD, systolic CHF, stent placement, on Brilinta In May 2019 had pulseless VT with CPR hypothermia protocol.  Stents placed.  Did well.  I have discussed the indications, details, techniques, and numerous risk of appendectomy with the patient.  He is aware of the risk of bleeding, infection, conversion to open laparotomy, injury to adjacent organs, alternative diagnosis, cardiac pulmonary and thromboembolic problems.  He understands all of these issues.  All his questions were answered.  He agrees with this plan.   LOS: 2 days    Lee Moore 02/17/2018

## 2018-02-17 NOTE — Progress Notes (Addendum)
Advanced Heart Failure Rounding Note  PCP-Cardiologist: No primary care provider on file.   Subjective:    P2Y12 remains low at 3 this am. Off Brilinta since Tuesday.   RLQ sore, but not too bad. He feels like it has improved with antibiotics. Appetite okay. No fever/chills.   Objective:   Weight Range: 94 kg Body mass index is 30.6 kg/m.   Vital Signs:   Temp:  [97.8 F (36.6 C)-98.3 F (36.8 C)] 97.8 F (36.6 C) (11/22 0533) Pulse Rate:  [54-68] 54 (11/22 0533) Resp:  [16-17] 16 (11/22 0533) BP: (104-114)/(52-78) 108/76 (11/22 0533) SpO2:  [100 %] 100 % (11/22 0533) Weight:  [94 kg] 94 kg (11/22 0500) Last BM Date: 02/14/18  Weight change: Filed Weights   02/15/18 1841 02/16/18 0500 02/17/18 0500  Weight: 95.7 kg 95.1 kg 94 kg    Intake/Output:   Intake/Output Summary (Last 24 hours) at 02/17/2018 0926 Last data filed at 02/16/2018 2207 Gross per 24 hour  Intake 957 ml  Output -  Net 957 ml      Physical Exam    General: No resp difficulty. HEENT: Normal Neck: Supple. JVP flat. Carotids 2+ bilat; no bruits. No thyromegaly or nodule noted. Cor: PMI nondisplaced. RRR, No M/G/R noted Lungs: CTAB, normal effort. Abdomen: Soft, non-tender, non-distended, no HSM. No bruits or masses. +BS  Extremities: No cyanosis, clubbing, or rash. R and LLE no edema.  Neuro: Alert & orientedx3, cranial nerves grossly intact. moves all 4 extremities w/o difficulty. Affect pleasant  Telemetry   NSR 60s with frequent PVCs (12-18/min) Personally reviewed. .   EKG    N/A  Labs    CBC Recent Labs    02/15/18 0343 02/16/18 0252 02/17/18 0254  WBC 10.6* 6.6 5.2  NEUTROABS 8.1*  --  2.1  HGB 14.9 13.4 13.2  HCT 46.8 41.8 40.5  MCV 91.6 92.1 91.4  PLT 275 231 227   Basic Metabolic Panel Recent Labs    16/10/96 0338  02/16/18 0252 02/17/18 0254  NA  --    < > 139 139  K  --    < > 3.8 3.9  CL  --    < > 107 108  CO2  --    < > 25 23  GLUCOSE  --    <  > 100* 104*  BUN  --    < > 9 7  CREATININE  --    < > 1.28* 1.10  CALCIUM  --    < > 8.8* 8.9  MG 2.0  --  2.0  --   PHOS  --   --  3.8  --    < > = values in this interval not displayed.   Liver Function Tests Recent Labs    02/15/18 0343  AST 22  ALT 36  ALKPHOS 57  BILITOT 1.2  PROT 8.1  ALBUMIN 4.5   No results for input(s): LIPASE, AMYLASE in the last 72 hours. Cardiac Enzymes No results for input(s): CKTOTAL, CKMB, CKMBINDEX, TROPONINI in the last 72 hours.  BNP: BNP (last 3 results) No results for input(s): BNP in the last 8760 hours.  ProBNP (last 3 results) No results for input(s): PROBNP in the last 8760 hours.   D-Dimer No results for input(s): DDIMER in the last 72 hours. Hemoglobin A1C No results for input(s): HGBA1C in the last 72 hours. Fasting Lipid Panel No results for input(s): CHOL, HDL, LDLCALC, TRIG, CHOLHDL, LDLDIRECT in the  last 72 hours. Thyroid Function Tests No results for input(s): TSH, T4TOTAL, T3FREE, THYROIDAB in the last 72 hours.  Invalid input(s): FREET3  Other results:   Imaging    No results found.   Medications:     Scheduled Medications: . amiodarone  200 mg Oral Daily  . atorvastatin  80 mg Oral q1800  . carvedilol  6.25 mg Oral BID WC  . Chlorhexidine Gluconate Cloth  6 each Topical Once   And  . Chlorhexidine Gluconate Cloth  6 each Topical Once  . sacubitril-valsartan  1 tablet Oral BID  . spironolactone  12.5 mg Oral Daily    Infusions: . lactated ringers 1,000 mL with potassium chloride 20 mEq infusion 100 mL/hr at 02/17/18 0658    PRN Medications: acetaminophen **OR** acetaminophen, HYDROcodone-acetaminophen, morphine injection, ondansetron **OR** ondansetron (ZOFRAN) IV, polyethylene glycol    Patient Profile   Lee Moore is 42 year old with history of CAD, MI 2017 with 5 stents RCA, inferior MI STEMI 2019, VT, and PVCs with an ablation earlier this month.   Admitted today for acute  appendicitis.  Assessment/Plan   1. Acute Appendicitis CT showed acute appendicitis. Surgery recommending laparoscopic appendectomy.  - Brillinta stopped Tuesday.  -Ok to proceed with surgery. He is low risk  for surgery. - Planning for surgery tomorrow vs Sunday. P2Y23 level still low at 3 this am.  May need to wait until Sunday. Will discuss further with MD.    2. [facility]  He has inferior MI 07/2017 with DES to proximal/mid RCA but distal RCA and PLV/PDA remained occluded. Lexiscan 12/2017 with inferior scar.  - No s/s ischemia.   - He is off brillinta. Last dose 11/19. P2Y12 level was 3 again this am.  - Continue aspirin and atorvastatin.   3. Chronic Systolic Heart Failure ECHO 12/2017 EF 45-50%. Volume status stable Continue entresto, carveidlol, and spironolactone at current dose.  SBP 100-110s  4. 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 Continue amiodarone 200 mg daily.  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 No change.   5. H/O VT  07/2017 with pulseless VR with CPR and hypothermia protocol. No change.     Length of Stay: 2  Lee HighlandAshley M Smith, Lee Moore  02/17/2018, 9:26 AM  Advanced Heart Failure Team Pager [phone] (M-F; 7a - 4p)  Please contact [facility] for night-coverage after hours (4p -7a ) and weekends on [email or web address]  Patient seen with Lee Moore, agree with the above note.    He still has some abdominal soreness but overall improved.  No fever.  He should have appendectomy before going home.   No dyspnea or chest pain.   He remains off ticagrelor, has not had a dose since Tuesday.  However, P2Y12 remains markedly suppressed to 3 still. As ticagrelor should be more rapidly reversed than Plavix or Effient, this is surprising.  I question whether there is an issue with the assay or whether the patient actually has abnormally robust platelet suppression.  This is clinically relevant as I am unsure how safe he is to have his  appendectomy at this point.  - I will ask hematology see him and comment on this findings.  - I will also ask the lab to look at this and see if there is any issue with the assay itself.   Lee Moore 02/17/2018 10:39 AM

## 2018-02-18 LAB — PROTIME-INR
INR: 1.08
Prothrombin Time: 13.9 seconds (ref 11.4–15.2)

## 2018-02-18 LAB — BASIC METABOLIC PANEL
ANION GAP: 6 (ref 5–15)
BUN: 13 mg/dL (ref 6–20)
CALCIUM: 8.9 mg/dL (ref 8.9–10.3)
CO2: 24 mmol/L (ref 22–32)
Chloride: 107 mmol/L (ref 98–111)
Creatinine, Ser: 1.19 mg/dL (ref 0.61–1.24)
GFR calc Af Amer: >60 mL/min (ref >60)
Glucose, Bld: 117 mg/dL — ABNORMAL HIGH (ref 70–99)
Potassium: 4.1 mmol/L (ref 3.5–5.1)
Sodium: 137 mmol/L (ref 135–145)

## 2018-02-18 LAB — APTT: APTT: 35 s (ref 24–36)

## 2018-02-18 LAB — PLATELET INHIBITION P2Y12: PLATELET FUNCTION P2Y12: 92 [PRU] — AB (ref 194–418)

## 2018-02-18 LAB — GLUCOSE, CAPILLARY: Glucose-Capillary: 108 mg/dL — ABNORMAL HIGH (ref 70–99)

## 2018-02-18 NOTE — Progress Notes (Signed)
     Assessment & Plan: HD#4 Acute appendicitis without perforation or abscess  Recommend laparoscopic appendectomy when stable from cardiac standpoint.  Continue Zosyn and IVF's  PGY12 rising to 92 this AM  Patient on regular diet - will make NPO after MN tonight  Anticipate lap appendectomy on Sunday, 11/24, if cleared by cardiology and PGY12 level adequate  History MI, CAD, systolic CHF, stent placement, on Brilinta  Tentatively plan lap appendectomy on Sunday, 11/24, with Dr. Dwain SarnaWakefield if OK with cardiology and medical services.         Darnell Levelodd Abraham Margulies, MD       [facility]       Office: [phone]   Chief Complaint: Recurrent acute appendicitis  Subjective: Patient up at bedside, finishing regular breakfast meal.  Mild pain.  Waiting to watch football game.  Objective: Vital signs in last 24 hours: Temp:  [97.7 F (36.5 C)-99.5 F (37.5 C)] 97.7 F (36.5 C) (11/23 0537) Pulse Rate:  [61-77] 62 (11/23 0537) Resp:  [16-18] 16 (11/23 0537) BP: (102-108)/(57-71) 108/71 (11/23 0537) SpO2:  [100 %] 100 % (11/23 0537) Weight:  [92.4 kg] 92.4 kg (11/23 0537) Last BM Date: 02/17/18  Intake/Output from previous day: 11/22 0701 - 11/23 0700 In: 3051.3 [P.O.:930; I.V.:2121.3] Out: 175 [Urine:175] Intake/Output this shift: No intake/output data recorded.  Physical Exam: HEENT - sclerae clear, mucous membranes moist Neck - soft Chest - clear bilaterally Cor - RRR Abdomen - soft without distension; mild tenderness RLQ; no guarding; no mass Ext - no edema, non-tender Neuro - alert & oriented, no focal deficits  Lab Results:  Recent Labs    02/16/18 0252 02/17/18 0254  WBC 6.6 5.2  HGB 13.4 13.2  HCT 41.8 40.5  PLT 231 227   BMET Recent Labs    02/17/18 0254 02/18/18 0520  NA 139 137  K 3.9 4.1  CL 108 107  CO2 23 24  GLUCOSE 104* 117*  BUN 7 13  CREATININE 1.10 1.19  CALCIUM 8.9 8.9   PT/INR Recent Labs    02/18/18 0520    LABPROT 13.9  INR 1.08   Comprehensive Metabolic Panel:    Component Value Date/Time   NA 137 02/18/2018 0520   NA 139 02/17/2018 0254   K 4.1 02/18/2018 0520   K 3.9 02/17/2018 0254   CL 107 02/18/2018 0520   CL 108 02/17/2018 0254   CO2 24 02/18/2018 0520   CO2 23 02/17/2018 0254   BUN 13 02/18/2018 0520   BUN 7 02/17/2018 0254   CREATININE 1.19 02/18/2018 0520   CREATININE 1.10 02/17/2018 0254   GLUCOSE 117 (H) 02/18/2018 0520   GLUCOSE 104 (H) 02/17/2018 0254   CALCIUM 8.9 02/18/2018 0520   CALCIUM 8.9 02/17/2018 0254   AST 22 02/15/2018 0343   AST 26 01/31/2018 0943   ALT 36 02/15/2018 0343   ALT 30 01/31/2018 0943   ALKPHOS 57 02/15/2018 0343   ALKPHOS 56 01/31/2018 0943   BILITOT 1.2 02/15/2018 0343   BILITOT 1.0 01/31/2018 0943   PROT 8.1 02/15/2018 0343   PROT 7.0 01/31/2018 0943   ALBUMIN 4.5 02/15/2018 0343   ALBUMIN 4.2 01/31/2018 0943    Studies/Results: No results found.    [name] 02/18/2018  Patient ID: Lee Moore, male   DOB: [birth year], 42 y.o.   MRN: [id number]

## 2018-02-18 NOTE — Progress Notes (Signed)
Patient ID: Lee Moore, male   DOB: 1975/08/14, 42 y.o.   MRN: [id number]    Advanced Heart Failure Rounding Note  PCP-Cardiologist: No primary care provider on file.   Subjective:    P2Y12 now rising, up to 92 today. Off Brilinta since Tuesday.   Abdominal pain has resolved.  No chest pain or dyspnea.    Objective:   Weight Range: 92.4 kg Body mass index is 30.1 kg/m.   Vital Signs:   Temp:  [97.7 F (36.5 C)-98.3 F (36.8 C)] 97.7 F (36.5 C) (11/23 0537) Pulse Rate:  [61-77] 62 (11/23 0537) Resp:  [16-18] 16 (11/23 0537) BP: (102-108)/(57-71) 108/71 (11/23 0537) SpO2:  [100 %] 100 % (11/23 0537) Weight:  [92.4 kg] 92.4 kg (11/23 0537) Last BM Date: 02/17/18  Weight change: Filed Weights   02/16/18 0500 02/17/18 0500 02/18/18 0537  Weight: 95.1 kg 94 kg 92.4 kg    Intake/Output:   Intake/Output Summary (Last 24 hours) at 02/18/2018 1040 Last data filed at 02/18/2018 0949 Gross per 24 hour  Intake 3622.95 ml  Output 175 ml  Net 3447.95 ml      Physical Exam    General: NAD Neck: No JVD, no thyromegaly or thyroid nodule.  Lungs: Clear to auscultation bilaterally with normal respiratory effort. CV: Nondisplaced PMI.  Heart regular S1/S2, no S3/S4, no murmur.  No peripheral edema.  No carotid bruit.  Normal pedal pulses.  Abdomen: Soft, nontender, no hepatosplenomegaly, no distention.  Skin: Intact without lesions or rashes.  Neurologic: Alert and oriented x 3.  Psych: Normal affect. Extremities: No clubbing or cyanosis.  HEENT: Normal.   Telemetry   NSR 60s with PVCs. Personally reviewed.   EKG    N/A  Labs    CBC Recent Labs    02/16/18 0252 02/17/18 0254  WBC 6.6 5.2  NEUTROABS  --  2.1  HGB 13.4 13.2  HCT 41.8 40.5  MCV 92.1 91.4  PLT 231 227   Basic Metabolic Panel Recent Labs    40/98/11 0252 02/17/18 0254 02/18/18 0520  NA 139 139 137  K 3.8 3.9 4.1  CL 107 108 107  CO2 25 23 24   GLUCOSE 100* 104* 117*  BUN 9 7 13     CREATININE 1.28* 1.10 1.19  CALCIUM 8.8* 8.9 8.9  MG 2.0  --   --   PHOS 3.8  --   --    Liver Function Tests No results for input(s): AST, ALT, ALKPHOS, BILITOT, PROT, ALBUMIN in the last 72 hours. No results for input(s): LIPASE, AMYLASE in the last 72 hours. Cardiac Enzymes No results for input(s): CKTOTAL, CKMB, CKMBINDEX, TROPONINI in the last 72 hours.  BNP: BNP (last 3 results) No results for input(s): BNP in the last 8760 hours.  ProBNP (last 3 results) No results for input(s): PROBNP in the last 8760 hours.   D-Dimer No results for input(s): DDIMER in the last 72 hours. Hemoglobin A1C No results for input(s): HGBA1C in the last 72 hours. Fasting Lipid Panel No results for input(s): CHOL, HDL, LDLCALC, TRIG, CHOLHDL, LDLDIRECT in the last 72 hours. Thyroid Function Tests No results for input(s): TSH, T4TOTAL, T3FREE, THYROIDAB in the last 72 hours.  Invalid input(s): FREET3  Other results:   Imaging    No results found.   Medications:     Scheduled Medications: . amiodarone  200 mg Oral Daily  . atorvastatin  80 mg Oral q1800  . carvedilol  6.25 mg Oral BID WC  .  Chlorhexidine Gluconate Cloth  6 each Topical Once  . mupirocin ointment  1 application Nasal BID  . sacubitril-valsartan  1 tablet Oral BID  . spironolactone  12.5 mg Oral Daily    Infusions: . lactated ringers 1,000 mL with potassium chloride 20 mEq infusion 100 mL/hr at 02/17/18 1719    PRN Medications: acetaminophen **OR** acetaminophen, HYDROcodone-acetaminophen, morphine injection, ondansetron **OR** ondansetron (ZOFRAN) IV, polyethylene glycol    Patient Profile   [name] is 42 year old with history of CAD, MI 2017 with 5 stents RCA, inferior MI STEMI 2019, VT, and PVCs with an ablation earlier this month.   Admitted today for acute appendicitis.  Assessment/Plan   1. Acute Appendicitis: Confirmed by CT. Surgery recommending laparoscopic appendectomy, needs this  admission as this is a recurrence.  He has been off ticagrelor since Tuesday, platelet function slow to return but P2Y12 test now higher at 92 today.  Suspect will continue to rise, suspect ready for surgery tomorrow.   2. CAD: Dating back to 2017 with 5 stents to RCA.  He has inferior MI 07/2017 with DES to proximal/mid RCA but distal RCA and PLV/PDA remained occluded. Lexiscan 12/2017 with inferior scar.  No chest pain.  - He is off ticagrelor. Last dose 11/19. P2Y12 level up to 92 today. Restart post-surgery when stable.  - Continue atorvastatin.  3. Chronic Systolic Heart Failure: ECHO 12/2017 EF 45-50%. Volume status stable. SBP 100s.  - Continue entresto, carveidlol, and spironolactone at current dose.  4. PVCs: Had PVC ablation on 11/15. Still with PVCs on monitor but less than past. - Continue amiodarone 200 mg daily.   Length of Stay: 3  Marca Anconaalton Kamariya Blevens, MD  02/18/2018, 10:40 AM  Advanced Heart Failure Team Pager [phone] (M-F; 7a - 4p)  Please contact [facility] for night-coverage after hours (4p -7a ) and weekends on [email or web address]

## 2018-02-18 NOTE — Progress Notes (Signed)
PROGRESS NOTE  Lee Moore WUX:[id number] DOB: 1975-08-02 DOA: 02/15/2018 PCP: Patient, No Pcp Per  HPI/Recap of past 24 hours: Lee Moore is a 42 y.o. 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  Patient was last admitted for similar presentation on 10/2017 found to have acute appendicitis which was managed conservatively with pain control and Augmentin course by surgery.  Patient states this abdominal pain was very similar to that, denies any nausea, vomiting, chest pain, constipation or diarrhea.  Denies any fevers or chills.  Regarding his chronic CHF with reduced EF and CAD status post DES to RCA (07/2017).  Patient has not had any symptoms of chest pain or worsening shortness of breath.  He denies any significant swelling, PND or orthopnea.  He has been adherent to his cardiac medication regimen.  For his frequent PVCs patient underwent recent ablation 02/10/18 without complications.  [facility] hospitalist was called to admit in addition to obtaining cardiac clearance before any procedure by general surgery.  ADDENDUM: Still very low platelet function. Might have to wait an additional day for surgery and repeat P2Y12 level.  02/17/2018: Patient seen and examined at his bedside.  No acute events overnight.  Still has right lower quadrant abdominal pain on palpation.  P2Y12 level still low.  Possible lap appendectomy on Sunday, 02/19/2018  Assessment/Plan: Active Problems:   Cardiac arrest with ventricular fibrillation (HCC)   ST elevation myocardial infarction (STEMI) of inferior wall [facility])   Essential  hypertension   Ischemic cardiomyopathy   Acute appendicitis without peritonitis   CAD S/P percutaneous coronary angioplasty   AKI (acute kidney injury) (HCC)   Acute appendicitis without peritonitis Awaiting P2Y12 level to normalize prior to surgery, did rise to 92 today, he is off Brilinta since Tuesday, hopefully he will be ready for surgery tomorrow Lape appendectomy possibly on Sunday, 02/19/2018 C/w IV antibiotics empirically zosyn Continue to hold antiplatelets and subcu Lovenox per general surgery  CKD 2, stable Appears baseline cr 1.1 with GFR >60 Avoid nephrotoxic agents  Recent MI with stent placement Hold brilinta and aspirin for surgery  Chronic HFREF 45-50% Cardiology following No acute issues Last TTE on 12/2016 with EF 45 to 50% (previously 35% after recent STEMI as mentioned above) disease.  Euvolemic on exam. -Cardiology recommends continuing his home Entresto, carvedilol, spironolactone at current dose -We will closely monitor blood pressure - No need for fluids currently since patient will be on clear liquid diet for now. -Daily weights, strict I's and O's  CAD status post inferior STEMI 07/2017 status post DES to RCA. Complicated by pulseless VT for which he underwent CPR and underwent hypothermia protocol during that admission. Also has history of stents after MI in 2017 [facility]) Has been on aspirin and Brilinta since then .  Reports adequate exercise tolerance able to go up 4 flights of steps without significant dyspnea or CP. - Hold Brilinta and aspirin per general surgery -Awaiting further recommendations from cardiology given stent placed 6 months ago  DVT prophylaxis:  SCDs.  Hold subcu Lovenox per general surgery due to possible surgery   Code Status: Full code   Family  Communication:  No family at bedside   Disposition Plan: Home when gen surgery signs off  Consults called: Cardiology and Surgery     Objective: Vitals:   02/17/18  1801 02/17/18 2119 02/18/18 0537 02/18/18 1404  BP: 102/62 108/60 108/71 113/63  Pulse: 77 62 62 (!) 59  Resp:  18 16 16   Temp:  98.3 F (36.8 C) 97.7 F (36.5 C) 98.3 F (36.8 C)  TempSrc:  Oral Oral Oral  SpO2:  100% 100% 100%  Weight:   92.4 kg   Height:        Intake/Output Summary (Last 24 hours) at 02/18/2018 1553 Last data filed at 02/18/2018 1330 Gross per 24 hour  Intake 2820 ml  Output 825 ml  Net 1995 ml   Filed Weights   02/16/18 0500 02/17/18 0500 02/18/18 0537  Weight: 95.1 kg 94 kg 92.4 kg    Exam:  Awake Alert, Oriented X 3, No new F.N deficits, Normal affect Symmetrical Chest wall movement, Good air movement bilaterally, CTAB RRR,No Gallops,Rubs or new Murmurs, No Parasternal Heave +ve B.Sounds, Abd Soft, No tenderness, No rebound - guarding or rigidity. No Cyanosis, Clubbing or edema, No new Rash or bruise     Data Reviewed: CBC: Recent Labs  Lab 02/15/18 0343 02/16/18 0252 02/17/18 0254  WBC 10.6* 6.6 5.2  NEUTROABS 8.1*  --  2.1  HGB 14.9 13.4 13.2  HCT 46.8 41.8 40.5  MCV 91.6 92.1 91.4  PLT 275 231 227   Basic Metabolic Panel: Recent Labs  Lab 02/15/18 0338 02/15/18 0343 02/16/18 0252 02/17/18 0254 02/18/18 0520  NA  --  136 139 139 137  K  --  4.2 3.8 3.9 4.1  CL  --  103 107 108 107  CO2  --  23 25 23 24   GLUCOSE  --  110* 100* 104* 117*  BUN  --  15 9 7 13   CREATININE  --  1.25* 1.28* 1.10 1.19  CALCIUM  --  9.7 8.8* 8.9 8.9  MG 2.0  --  2.0  --   --   PHOS  --   --  3.8  --   --    GFR: Estimated Creatinine Clearance: 90.8 mL/min (by C-G formula based on SCr of 1.19 mg/dL). Liver Function Tests: Recent Labs  Lab 02/15/18 0343  AST 22  ALT 36  ALKPHOS 57  BILITOT 1.2  PROT 8.1  ALBUMIN 4.5   No results for input(s): LIPASE, AMYLASE in the last 168 hours. No results for input(s): AMMONIA in the last 168 hours. Coagulation Profile: Recent Labs  Lab 02/18/18 0520  INR 1.08   Cardiac Enzymes: No results for  input(s): CKTOTAL, CKMB, CKMBINDEX, TROPONINI in the last 168 hours. BNP (last 3 results) No results for input(s): PROBNP in the last 8760 hours. HbA1C: No results for input(s): HGBA1C in the last 72 hours. CBG: Recent Labs  Lab 02/16/18 0820 02/17/18 0753 02/18/18 0750  GLUCAP 89 86 108*   Lipid Profile: No results for input(s): CHOL, HDL, LDLCALC, TRIG, CHOLHDL, LDLDIRECT in the last 72 hours. Thyroid Function Tests: No results for input(s): TSH, T4TOTAL, FREET4, T3FREE, THYROIDAB in the last 72 hours. Anemia Panel: No results for input(s): VITAMINB12, FOLATE, FERRITIN, TIBC, IRON, RETICCTPCT in the last 72 hours. Urine analysis:    Component Value Date/Time   COLORURINE YELLOW 02/15/2018 0343   APPEARANCEUR CLEAR 02/15/2018 0343   LABSPEC 1.023 02/15/2018 0343   PHURINE 5.0 02/15/2018 0343   GLUCOSEU NEGATIVE  02/15/2018 0343   HGBUR SMALL (A) 02/15/2018 0343   BILIRUBINUR NEGATIVE 02/15/2018 0343   KETONESUR 5 (A) 02/15/2018 0343   PROTEINUR NEGATIVE 02/15/2018 0343   NITRITE NEGATIVE 02/15/2018 0343   LEUKOCYTESUR NEGATIVE 02/15/2018 0343   Sepsis Labs: @LABRCNTIP (procalcitonin:4,lacticidven:4)  ) Recent Results (from the past 240 hour(s))  Surgical PCR screen     Status: None   Collection Time: 02/17/18  2:44 PM  Result Value Ref Range Status   MRSA, PCR NEGATIVE NEGATIVE Final   Staphylococcus aureus NEGATIVE NEGATIVE Final    Comment: (NOTE) The Xpert SA Assay (FDA approved for NASAL specimens in patients 101 years of age and older), is one component of a comprehensive surveillance program. It is not intended to diagnose infection nor to guide or monitor treatment. Performed at [facility], [address], [place], [place] [address]       Studies: No results found.  Scheduled Meds: . amiodarone  200 mg Oral Daily  . atorvastatin  80 mg Oral q1800  . carvedilol  6.25 mg Oral BID WC  . Chlorhexidine Gluconate Cloth  6 each Topical Once  .  mupirocin ointment  1 application Nasal BID  . sacubitril-valsartan  1 tablet Oral BID  . spironolactone  12.5 mg Oral Daily    Continuous Infusions: . lactated ringers 1,000 mL with potassium chloride 20 mEq infusion 100 mL/hr at 02/17/18 1719     LOS: 3 days     Huey Bienenstock, MD [facility]  If 7PM-7AM, please contact night-coverage [email or web address] Password [facility] 02/18/2018, 3:53 PM

## 2018-02-19 ENCOUNTER — Encounter (HOSPITAL_COMMUNITY)
Admission: EM | Disposition: A | Discharge status: Home / Self Care | Payer: Self-pay | Source: Home / Self Care | Attending: Internal Medicine

## 2018-02-19 ENCOUNTER — Inpatient Hospital Stay (HOSPITAL_COMMUNITY): Payer: BLUE CROSS/BLUE SHIELD | Admitting: Certified Registered Nurse Anesthetist

## 2018-02-19 ENCOUNTER — Encounter (HOSPITAL_COMMUNITY): Payer: Self-pay | Admitting: Certified Registered Nurse Anesthetist

## 2018-02-19 HISTORY — PX: LAPAROSCOPIC APPENDECTOMY: SHX408

## 2018-02-19 LAB — PLATELET INHIBITION P2Y12: Platelet Function  P2Y12: 252 [PRU] (ref 194–418)

## 2018-02-19 LAB — BASIC METABOLIC PANEL
ANION GAP: 7 (ref 5–15)
BUN: 13 mg/dL (ref 6–20)
CALCIUM: 8.9 mg/dL (ref 8.9–10.3)
CO2: 23 mmol/L (ref 22–32)
Chloride: 108 mmol/L (ref 98–111)
Creatinine, Ser: 1.04 mg/dL (ref 0.61–1.24)
Glucose, Bld: 106 mg/dL — ABNORMAL HIGH (ref 70–99)
Potassium: 3.7 mmol/L (ref 3.5–5.1)
Sodium: 138 mmol/L (ref 135–145)

## 2018-02-19 LAB — CBC
HCT: 40.4 % (ref 39.0–52.0)
Hemoglobin: 13 g/dL (ref 13.0–17.0)
MCH: 29.5 pg (ref 26.0–34.0)
MCHC: 32.2 g/dL (ref 30.0–36.0)
MCV: 91.8 fL (ref 80.0–100.0)
NRBC: 0 % (ref 0.0–0.2)
Platelets: 281 10*3/uL (ref 150–400)
RBC: 4.4 MIL/uL (ref 4.22–5.81)
RDW: 13.8 % (ref 11.5–15.5)
WBC: 6.3 10*3/uL (ref 4.0–10.5)

## 2018-02-19 LAB — GLUCOSE, CAPILLARY: GLUCOSE-CAPILLARY: 96 mg/dL (ref 70–99)

## 2018-02-19 SURGERY — APPENDECTOMY, LAPAROSCOPIC
Anesthesia: General | Site: Abdomen

## 2018-02-19 MED ORDER — PROPOFOL 10 MG/ML IV BOLUS
INTRAVENOUS | Status: AC
  Filled 2018-02-19: qty 20

## 2018-02-19 MED ORDER — SUCCINYLCHOLINE CHLORIDE 200 MG/10ML IV SOSY
PREFILLED_SYRINGE | INTRAVENOUS | Status: AC
  Filled 2018-02-19: qty 10

## 2018-02-19 MED ORDER — ROCURONIUM BROMIDE 50 MG/5ML IV SOSY
PREFILLED_SYRINGE | INTRAVENOUS | Status: AC
  Filled 2018-02-19: qty 5

## 2018-02-19 MED ORDER — MIDAZOLAM HCL 5 MG/5ML IJ SOLN
INTRAMUSCULAR | Status: DC | PRN
  Administered 2018-02-19: 2 mg via INTRAVENOUS

## 2018-02-19 MED ORDER — ONDANSETRON HCL 4 MG/2ML IJ SOLN
INTRAMUSCULAR | Status: DC | PRN
  Administered 2018-02-19: 4 mg via INTRAVENOUS

## 2018-02-19 MED ORDER — LACTATED RINGERS IV SOLN
INTRAVENOUS | Status: DC | PRN
  Administered 2018-02-19: 09:00:00 via INTRAVENOUS

## 2018-02-19 MED ORDER — SUGAMMADEX SODIUM 200 MG/2ML IV SOLN
INTRAVENOUS | Status: DC | PRN
  Administered 2018-02-19: 200 mg via INTRAVENOUS

## 2018-02-19 MED ORDER — SODIUM CHLORIDE 0.9 % IV SOLN
INTRAVENOUS | Status: DC | PRN
  Administered 2018-02-19: 20 ug/min via INTRAVENOUS

## 2018-02-19 MED ORDER — OXYCODONE HCL 5 MG PO TABS
ORAL_TABLET | ORAL | Status: AC
  Administered 2018-02-19: 5 mg via ORAL
  Filled 2018-02-19: qty 1

## 2018-02-19 MED ORDER — DEXAMETHASONE SODIUM PHOSPHATE 10 MG/ML IJ SOLN
INTRAMUSCULAR | Status: AC
  Filled 2018-02-19: qty 2

## 2018-02-19 MED ORDER — OXYCODONE HCL 5 MG PO TABS
5.0000 mg | ORAL_TABLET | Freq: Once | ORAL | Status: AC | PRN
  Administered 2018-02-19: 5 mg via ORAL

## 2018-02-19 MED ORDER — SODIUM CHLORIDE 0.9 % IR SOLN
Status: DC | PRN
  Administered 2018-02-19: 1

## 2018-02-19 MED ORDER — ROCURONIUM BROMIDE 100 MG/10ML IV SOLN
INTRAVENOUS | Status: DC | PRN
  Administered 2018-02-19: 50 mg via INTRAVENOUS

## 2018-02-19 MED ORDER — CEFAZOLIN SODIUM-DEXTROSE 2-4 GM/100ML-% IV SOLN
INTRAVENOUS | Status: AC
  Filled 2018-02-19: qty 100

## 2018-02-19 MED ORDER — LIDOCAINE HCL (CARDIAC) PF 100 MG/5ML IV SOSY
PREFILLED_SYRINGE | INTRAVENOUS | Status: DC | PRN
  Administered 2018-02-19: 60 mg via INTRAVENOUS

## 2018-02-19 MED ORDER — BUPIVACAINE-EPINEPHRINE (PF) 0.25% -1:200000 IJ SOLN
INTRAMUSCULAR | Status: AC
  Filled 2018-02-19: qty 30

## 2018-02-19 MED ORDER — PHENYLEPHRINE HCL 10 MG/ML IJ SOLN
INTRAMUSCULAR | Status: DC | PRN
  Administered 2018-02-19: 40 ug via INTRAVENOUS

## 2018-02-19 MED ORDER — ACETAMINOPHEN 10 MG/ML IV SOLN
1000.0000 mg | Freq: Once | INTRAVENOUS | Status: DC | PRN
  Administered 2018-02-19: 1000 mg via INTRAVENOUS

## 2018-02-19 MED ORDER — OXYCODONE HCL 5 MG PO TABS: 5.0000 mg | ORAL_TABLET | Freq: Four times a day (QID) | ORAL | 0 refills | Status: DC | PRN

## 2018-02-19 MED ORDER — OXYCODONE HCL 5 MG/5ML PO SOLN: 5.0000 mg | Freq: Once | ORAL | Status: AC | PRN

## 2018-02-19 MED ORDER — ACETAMINOPHEN 500 MG PO TABS: 1000.0000 mg | ORAL_TABLET | Freq: Once | ORAL | Status: DC | PRN

## 2018-02-19 MED ORDER — PROPOFOL 10 MG/ML IV BOLUS
INTRAVENOUS | Status: DC | PRN
  Administered 2018-02-19: 110 mg via INTRAVENOUS

## 2018-02-19 MED ORDER — ACETAMINOPHEN 10 MG/ML IV SOLN
INTRAVENOUS | Status: AC
  Administered 2018-02-19: 1000 mg via INTRAVENOUS
  Filled 2018-02-19: qty 100

## 2018-02-19 MED ORDER — DEXAMETHASONE SODIUM PHOSPHATE 10 MG/ML IJ SOLN
INTRAMUSCULAR | Status: DC | PRN
  Administered 2018-02-19: 10 mg via INTRAVENOUS

## 2018-02-19 MED ORDER — LIDOCAINE 2% (20 MG/ML) 5 ML SYRINGE
INTRAMUSCULAR | Status: AC
  Filled 2018-02-19: qty 5

## 2018-02-19 MED ORDER — BUPIVACAINE-EPINEPHRINE 0.25% -1:200000 IJ SOLN
INTRAMUSCULAR | Status: DC | PRN
  Administered 2018-02-19: 7 mL

## 2018-02-19 MED ORDER — FENTANYL CITRATE (PF) 100 MCG/2ML IJ SOLN: 25.0000 ug | INTRAMUSCULAR | Status: DC | PRN

## 2018-02-19 MED ORDER — FENTANYL CITRATE (PF) 250 MCG/5ML IJ SOLN
INTRAMUSCULAR | Status: AC
  Filled 2018-02-19: qty 5

## 2018-02-19 MED ORDER — 0.9 % SODIUM CHLORIDE (POUR BTL) OPTIME
TOPICAL | Status: DC | PRN
  Administered 2018-02-19: 1000 mL

## 2018-02-19 MED ORDER — ACETAMINOPHEN 160 MG/5ML PO SOLN: 1000.0000 mg | Freq: Once | ORAL | Status: DC | PRN

## 2018-02-19 MED ORDER — ONDANSETRON HCL 4 MG/2ML IJ SOLN
INTRAMUSCULAR | Status: AC
  Filled 2018-02-19: qty 2

## 2018-02-19 MED ORDER — MIDAZOLAM HCL 2 MG/2ML IJ SOLN
INTRAMUSCULAR | Status: AC
  Filled 2018-02-19: qty 2

## 2018-02-19 MED ORDER — TICAGRELOR 90 MG PO TABS: 90.0000 mg | ORAL_TABLET | Freq: Two times a day (BID) | ORAL | Status: DC

## 2018-02-19 MED ORDER — FENTANYL CITRATE (PF) 250 MCG/5ML IJ SOLN
INTRAMUSCULAR | Status: DC | PRN
  Administered 2018-02-19: 50 ug via INTRAVENOUS
  Administered 2018-02-19: 100 ug via INTRAVENOUS
  Administered 2018-02-19 (×2): 50 ug via INTRAVENOUS

## 2018-02-19 MED ORDER — EPHEDRINE 5 MG/ML INJ
INTRAVENOUS | Status: AC
  Filled 2018-02-19: qty 10

## 2018-02-19 MED ORDER — PHENYLEPHRINE 40 MCG/ML (10ML) SYRINGE FOR IV PUSH (FOR BLOOD PRESSURE SUPPORT)
PREFILLED_SYRINGE | INTRAVENOUS | Status: AC
  Filled 2018-02-19: qty 10

## 2018-02-19 SURGICAL SUPPLY — 45 items
APPLIER CLIP ROT 10 11.4 M/L (STAPLE)
CANISTER SUCT 3000ML PPV (MISCELLANEOUS) ×2 IMPLANT
CHLORAPREP W/TINT 26ML (MISCELLANEOUS) ×2 IMPLANT
CLIP APPLIE ROT 10 11.4 M/L (STAPLE) IMPLANT
COVER SURGICAL LIGHT HANDLE (MISCELLANEOUS) ×2 IMPLANT
COVER WAND RF STERILE (DRAPES) ×2 IMPLANT
CUTTER FLEX LINEAR 45M (STAPLE) ×2 IMPLANT
DECANTER SPIKE VIAL GLASS SM (MISCELLANEOUS) ×2 IMPLANT
DERMABOND ADHESIVE PROPEN (GAUZE/BANDAGES/DRESSINGS) ×1
DERMABOND ADVANCED (GAUZE/BANDAGES/DRESSINGS) ×1
DERMABOND ADVANCED .7 DNX12 (GAUZE/BANDAGES/DRESSINGS) ×1 IMPLANT
DERMABOND ADVANCED .7 DNX6 (GAUZE/BANDAGES/DRESSINGS) ×1 IMPLANT
ELECT REM PT RETURN 9FT ADLT (ELECTROSURGICAL) ×2
ELECTRODE REM PT RTRN 9FT ADLT (ELECTROSURGICAL) ×1 IMPLANT
GLOVE BIO SURGEON STRL SZ7 (GLOVE) ×6 IMPLANT
GLOVE BIOGEL PI IND STRL 7.5 (GLOVE) ×1 IMPLANT
GLOVE BIOGEL PI INDICATOR 7.5 (GLOVE) ×1
GOWN STRL REUS W/ TWL LRG LVL3 (GOWN DISPOSABLE) ×3 IMPLANT
GOWN STRL REUS W/TWL LRG LVL3 (GOWN DISPOSABLE) ×3
GRASPER SUT TROCAR 14GX15 (MISCELLANEOUS) ×2 IMPLANT
KIT BASIN OR (CUSTOM PROCEDURE TRAY) ×2 IMPLANT
KIT TURNOVER KIT B (KITS) ×2 IMPLANT
NS IRRIG 1000ML POUR BTL (IV SOLUTION) ×2 IMPLANT
PAD ARMBOARD 7.5X6 YLW CONV (MISCELLANEOUS) ×4 IMPLANT
POUCH RETRIEVAL ECOSAC 10 (ENDOMECHANICALS) ×1 IMPLANT
POUCH RETRIEVAL ECOSAC 10MM (ENDOMECHANICALS) ×1
RELOAD 45 VASCULAR/THIN (ENDOMECHANICALS) IMPLANT
RELOAD STAPLE TA45 3.5 REG BLU (ENDOMECHANICALS) ×4 IMPLANT
SCISSORS LAP 5X35 DISP (ENDOMECHANICALS) IMPLANT
SET IRRIG TUBING LAPAROSCOPIC (IRRIGATION / IRRIGATOR) ×2 IMPLANT
SHEARS HARMONIC ACE PLUS 36CM (ENDOMECHANICALS) ×2 IMPLANT
SLEEVE ENDOPATH XCEL 5M (ENDOMECHANICALS) ×2 IMPLANT
SPECIMEN JAR SMALL (MISCELLANEOUS) ×2 IMPLANT
STRIP CLOSURE SKIN 1/2X4 (GAUZE/BANDAGES/DRESSINGS) IMPLANT
STRIP CLOSURE SKIN 1/4X4 (GAUZE/BANDAGES/DRESSINGS) ×2 IMPLANT
SUT MNCRL AB 4-0 PS2 18 (SUTURE) ×2 IMPLANT
SUT VICRYL 0 UR6 27IN ABS (SUTURE) ×2 IMPLANT
TOWEL OR 17X24 6PK STRL BLUE (TOWEL DISPOSABLE) IMPLANT
TOWEL OR 17X26 10 PK STRL BLUE (TOWEL DISPOSABLE) ×2 IMPLANT
TRAY FOLEY CATH SILVER 16FR (SET/KITS/TRAYS/PACK) ×2 IMPLANT
TRAY LAPAROSCOPIC MC (CUSTOM PROCEDURE TRAY) ×2 IMPLANT
TROCAR XCEL BLUNT TIP 100MML (ENDOMECHANICALS) ×2 IMPLANT
TROCAR XCEL NON-BLD 5MMX100MML (ENDOMECHANICALS) ×2 IMPLANT
TUBING INSUFFLATION (TUBING) ×2 IMPLANT
WATER STERILE IRR 1000ML POUR (IV SOLUTION) ×2 IMPLANT

## 2018-02-19 NOTE — Discharge Instructions (Signed)
[facility] LAPAROSCOPIC SURGERY: POST OP INSTRUCTIONS May continue aspirin daily. Restart Brilinta on Tuesday. Always review your discharge instruction sheet given to you by the facility where your surgery was performed. IF YOU HAVE DISABILITY OR FAMILY LEAVE FORMS, YOU MUST BRING THEM TO THE OFFICE FOR PROCESSING.   DO NOT GIVE THEM TO YOUR DOCTOR.  1. A prescription for pain medication may be given to you upon discharge.  Take your pain medication as prescribed, if needed.  If narcotic pain medicine is not needed, then you may take acetaminophen (Tylenol), naprosyn (Alleve), or ibuprofen (Advil) as needed. 2. Take your usually prescribed medications unless otherwise directed. 3. If you need a refill on your pain medication, please contact your pharmacy.  They will contact our office to request authorization. Prescriptions will not be filled after 5pm or on week-ends. 4. You should follow a light diet the first few days after arrival home, such as soup and crackers, etc.  Be sure to include lots of fluids daily. 5. Most patients will experience some swelling and bruising in the area of the incisions.  Ice packs will help.  Swelling and bruising can take several days to resolve.  6. It is common to experience some constipation if taking pain medication after surgery.  Increasing fluid intake and taking a stool softener (such as Colace) will usually help or prevent this problem from occurring.  A mild laxative (Milk of Magnesia or Miralax) should be taken according to package instructions if there are no bowel movements after 48 hours. 7. Unless discharge instructions indicate otherwise, you may remove your bandages 48 hours after surgery, and you may shower at that time.  You may have steri-strips (small skin tapes) in place directly over the incision.  These strips should be left on the skin for 7-10 days.  If your surgeon used skin glue on the  incision, you may shower in 24 hours.  The glue will flake off over the next 2-3 weeks.  Any sutures or staples will be removed at the office during your follow-up visit. 8. ACTIVITIES:  You may resume regular (light) daily activities beginning the next day--such as daily self-care, walking, climbing stairs--gradually increasing activities as tolerated.  You may have sexual intercourse when it is comfortable.  Refrain from any heavy lifting or straining until approved by your doctor. a. You may drive when you are no longer taking prescription pain medication, you can comfortably wear a seatbelt, and you can safely maneuver your car and apply brakes. b. RETURN TO WORK:  __________________________________________________________ 9. You should see your doctor in the office for a follow-up appointment approximately 2-3 weeks after your surgery.  Make sure that you call for this appointment within a day or two after you arrive home to insure a convenient appointment time. 10. OTHER INSTRUCTIONS: __________________________________________________________________________________________________________________________ __________________________________________________________________________________________________________________________ WHEN TO CALL YOUR DOCTOR: 1. Fever over 101.0 2. Inability to urinate 3. Continued bleeding from incision. 4. Increased pain, redness, or drainage from the incision. 5. Increasing abdominal pain  The clinic staff is available to answer your questions during regular business hours.  Please dont hesitate to call and ask to speak to one of the nurses for clinical concerns.  If you have a medical emergency, go to the nearest emergency room or call 911.  A surgeon from [facility] is always on call at the hospital. [address], [place], [place]  [address] ? [address] [address], [place], [place]   [address] [phone]  [phone] ? [phone] ? FAX [phone]  [phone] Web site: [email or web address]

## 2018-02-19 NOTE — Anesthesia Procedure Notes (Signed)
Procedure Name: Intubation Date/Time: 02/19/2018 9:08 AM Performed by: Shirlyn Goltz, CRNA Pre-anesthesia Checklist: Patient identified, Emergency Drugs available, Suction available and Patient being monitored Patient Re-evaluated:Patient Re-evaluated prior to induction Oxygen Delivery Method: Circle system utilized Preoxygenation: Pre-oxygenation with 100% oxygen Induction Type: IV induction Ventilation: Mask ventilation without difficulty Laryngoscope Size: Mac and 4 Grade View: Grade I Tube type: Oral Tube size: 7.0 mm Number of attempts: 1 Airway Equipment and Method: Stylet Placement Confirmation: ETT inserted through vocal cords under direct vision,  positive ETCO2 and breath sounds checked- equal and bilateral Secured at: 22 cm Tube secured with: Tape Dental Injury: Teeth and Oropharynx as per pre-operative assessment

## 2018-02-19 NOTE — Transfer of Care (Signed)
Immediate Anesthesia Transfer of Care Note  Patient: Lee DakinCory Moore  Procedure(s) Performed: APPENDECTOMY LAPAROSCOPIC (N/A Abdomen)  Patient Location: PACU  Anesthesia Type:General  Level of Consciousness: drowsy and patient cooperative  Airway & Oxygen Therapy: Patient Spontanous Breathing and Patient connected to nasal cannula oxygen  Post-op Assessment: Report given to RN and Post -op Vital signs reviewed and stable  Post vital signs: Reviewed and stable  Last Vitals:  Vitals Value Taken Time  BP 127/90 02/19/2018 10:20 AM  Temp    Pulse 68 02/19/2018 10:21 AM  Resp 14 02/19/2018 10:21 AM  SpO2 98 % 02/19/2018 10:21 AM  Vitals shown include unvalidated device data.  Last Pain:  Vitals:   02/19/18 0800  TempSrc:   PainSc: 0-No pain      Patients Stated Pain Goal: 0 (02/19/18 0032)  Complications: No apparent anesthesia complications

## 2018-02-19 NOTE — Progress Notes (Signed)
Day of Surgery   Subjective/Chief Complaint: rlq pain   Objective: Vital signs in last 24 hours: Temp:  [97.8 F (36.6 C)-98.3 F (36.8 C)] 97.8 F (36.6 C) (11/24 0526) Pulse Rate:  [54-59] 54 (11/24 0526) Resp:  [16-19] 19 (11/24 0526) BP: (97-113)/(62-70) 97/70 (11/24 0526) SpO2:  [98 %-100 %] 100 % (11/24 0526) Last BM Date: 02/17/18  Intake/Output from previous day: 11/23 0701 - 11/24 0700 In: 2101.7 [P.O.:720; I.V.:1381.7] Out: 650 [Urine:650] Intake/Output this shift: No intake/output data recorded.  GI: soft tender rlq  Lab Results:  Recent Labs    02/17/18 0254 02/19/18 0332  WBC 5.2 6.3  HGB 13.2 13.0  HCT 40.5 40.4  PLT 227 281   BMET Recent Labs    02/18/18 0520 02/19/18 0332  NA 137 138  K 4.1 3.7  CL 107 108  CO2 24 23  GLUCOSE 117* 106*  BUN 13 13  CREATININE 1.19 1.04  CALCIUM 8.9 8.9   PT/INR Recent Labs    02/18/18 0520  LABPROT 13.9  INR 1.08   ABG No results for input(s): PHART, HCO3 in the last 72 hours.  Invalid input(s): PCO2, PO2  Studies/Results: No results found.  Anti-infectives: Anti-infectives (From admission, onward)   Start     Dose/Rate Route Frequency Ordered Stop   02/15/18 0645  piperacillin-tazobactam (ZOSYN) IVPB 3.375 g     3.375 g 100 mL/hr over 30 Minutes Intravenous  Once 02/15/18 [id number] 02/15/18 0827      Assessment/Plan: HD#5 Acute appendicitis without perforation or abscess             on zosyn, lap appy today, risks/benefits discussed, will restart brilinta 48 hours after surgery   History MI, CAD, systolic CHF, stent placement, on Brilinta   Lee Moore 02/19/2018

## 2018-02-19 NOTE — Progress Notes (Signed)
Patient ID: Victory DakinCory Moore, male   DOB: 1975/10/05, 42 y.o.   MRN: [id number]  P2Y12 level up to 252 today.  Should be ready for surgery.  Would restart ticagrelor after appendectomy when stable per surgeon.   Marca AnconaDalton Stefano Trulson 02/19/2018 7:59 AM

## 2018-02-19 NOTE — Op Note (Signed)
Preoperative diagnosis: Recurrent acute appendicitis Postoperative diagnosis: Same as above Procedure: Laparoscopic appendectomy Surgeon: Dr. Harden MoMatt Stehanie Moore Anesthesia: General Estimated blood loss: Minimal Complications: None Drains: None Specimens: Appendix to pathology Sponge needle count x2 was correct at completion Disposition to recovery in stable condition  Indications: This is a 42 year old male who had appendicitis earlier this year after a cardiac arrest had occurred.  He was managed nonoperatively.  He has recurrent appendicitis.  He has been waiting for his Brilinta to wear off.  We discussed proceeding with a laparoscopic appendectomy today.  Procedure: After informed consent was obtained the patient was taken to the operating room.  He was given antibiotics.  SCDs were in place.  He was then placed under general anesthesia without complication.  His abdomen was prepped and draped in the standard sterile surgical fashion.  A surgical timeout was then performed.  I infiltrated Marcaine below his umbilicus.  I made a vertical incision.  Identified his fascia and grasped it.  I entered this sharply.  I entered his peritoneum bluntly.  There was no entry injury.  I then placed a 0 Vicryl pursestring suture.  A Hassan trocar was placed in the abdomen was then insufflated to 15 mmHg pressure.  2 further 5 mm trochars were placed in the lower abdomen under direct vision without complication.  I then was able to view his appendix.  His small bowel was adherent and I remove this bluntly.  His appendix was adherent to the sidewall.  I used a harmonic scalpel to take down these attachments.  I was then able to identify the appendix all the way to the base.  The entire appendix was inflamed.  I then encircled the base.  I used a GIA stapler to remove the appendix as well as a small cuff of cecum.  I then used a harmonic scalpel to divide the mesentery.  I then placed this in a bag and removed it.   Hemostasis was observed.  I placed his omentum in his right lower quadrant.  I then [facility] the [facility].  I tied down my pursestring.  I placed 2 additional 0 Vicryl sutures through this fascia.  The abdomen was then desufflated.  I removed the trochars.  I then closed the incisions with 4-0 Monocryl and glue.  He tolerated this well was extubated and transferred to recovery stable.  I will restart his Brilinta and 48 hours and he may be able to be discharged today.

## 2018-02-19 NOTE — Anesthesia Preprocedure Evaluation (Addendum)
Anesthesia Evaluation  Patient identified by MRN, date of birth, ID band Patient awake    Reviewed: Allergy & Precautions, NPO status , Patient's Chart, lab work & pertinent test results, reviewed documented beta blocker date and time   History of Anesthesia Complications Negative for: history of anesthetic complications  Airway Mallampati: II  TM Distance: >3 FB Neck ROM: Full    Dental  (+) Teeth Intact   Pulmonary neg pulmonary ROS,    breath sounds clear to auscultation       Cardiovascular hypertension, Pt. on home beta blockers and Pt. on medications + CAD, + Past MI, + Cardiac Stents and +CHF   Rhythm:Irregular Rate:Normal     Neuro/Psych negative neurological ROS  negative psych ROS   GI/Hepatic Neg liver ROS, appendycitis   Endo/Other  negative endocrine ROS  Renal/GU negative Renal ROS     Musculoskeletal   Abdominal   Peds  Hematology Brilinta held (last took 11/19)   Anesthesia Other Findings 07/2017 with pulseless VR with CPR and hypothermia protocol.    Dating back to 2017 with [facility] st[facility].  He has inferior MI 07/2017 with DES to proximal/mid RCA but distal RCA and PLV/PDA remained occluded. Lexiscan 12/2017 with inferior scar.   Had PVC ablation on 11/15. Continue amiodarone 200 mg daily.  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     Reproductive/Obstetrics                            Anesthesia Physical Anesthesia Plan  ASA: III  Anesthesia Plan: General   Post-op Pain Management:    Induction: Intravenous and Rapid sequence  PONV Risk Score and Plan: 2 and Ondansetron and Dexamethasone  Airway Management Planned: Oral ETT  Additional Equipment: None  Intra-op Plan:   Post-operative Plan: Extubation in OR  Informed Consent: I have reviewed the patients History and Physical, chart, labs and discussed the procedure including the risks, benefits  and alternatives for the proposed anesthesia with the patient or authorized representative who has indicated his/her understanding and acceptance.   Dental advisory given  Plan Discussed with: CRNA and Surgeon  Anesthesia Plan Comments:         Anesthesia Quick Evaluation

## 2018-02-19 NOTE — Discharge Summary (Signed)
Lee Moore, is a 42 y.o. male  DOB 03/04/1976  MRN [id number].  Admission date:  02/15/2018  Admitting Physician  Laverna Peace, MD  Discharge Date:  02/19/2018   Primary MD  Patient, No Pcp Per  Recommendations for primary care physician for things to follow:  -Check CBC, BMP during next visit -Follow with general surgery as an outpatient   Admission Diagnosis  Acute appendicitis, unspecified acute appendicitis type [K35.80]   Discharge Diagnosis  Acute appendicitis, unspecified acute appendicitis type [K35.80]    Active Problems:   Cardiac arrest with ventricular fibrillation (HCC)   ST elevation myocardial infarction (STEMI) of inferior wall (HCC)   Essential hypertension   Ischemic cardiomyopathy   Acute appendicitis without peritonitis   CAD S/P percutaneous coronary angioplasty   AKI (acute kidney injury) (HCC)      Past Medical History:  Diagnosis Date  . Coronary artery disease   . Essential hypertension   . History of cardiac arrest 07/30/2017   VT   . MI (myocardial infarction) [facility])     Past Surgical History:  Procedure Laterality Date  . CORONARY/GRAFT ACUTE MI REVASCULARIZATION N/A 07/30/2017   Procedure: Coronary/Graft Acute MI Revascularization;  Surgeon: Rinaldo Cloud, MD;  Location: [facility] INVASIVE CV LAB;  Service: Cardiovascular;  Laterality: N/A;  . LEFT HEART CATH AND CORONARY ANGIOGRAPHY N/A 07/30/2017   Procedure: LEFT HEART CATH AND CORONARY ANGIOGRAPHY;  Surgeon: Rinaldo Cloud, MD;  Location: [facility] INVASIVE CV LAB;  Service: Cardiovascular;  Laterality: N/A;  . PVC ABLATION N/A 02/10/2018   Procedure: PVC ABLATION;  Surgeon: Regan Lemming, MD;  Location: [facility] INVASIVE CV LAB;  Service: Cardiovascular;  Laterality: N/A;       History of present illness and  Hospital Course:     Kindly see H&P for history of present illness and admission details, please  review complete Labs, Consult reports and Test reports for all details in brief  HPI  from the history and physical done on the day of admission 02/15/2018  HPI: Lee Moore is a 42 y.o. 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  Patient was last admitted for similar presentation on 10/2017 found to have acute appendicitis which was managed conservatively with pain control and Augmentin course by surgery.  Patient states this abdominal pain was very similar to that, denies any nausea, vomiting, chest pain, constipation or diarrhea.  Denies any fevers or chills.  Regarding his chronic CHF with reduced EF and CAD status post DES to RCA (07/2017).  Patient has not had any symptoms of chest pain or worsening shortness of breath.  He denies any significant swelling, PND or orthopnea.  He  has been adherent to his medication regimen.  For his frequent PVCs patient underwent recent ablation 11/15 without complications.    ED Course: In the ED he was afebrile and hemodynamically stable.  Lab work significant for creatinine of 1.25 (baseline 1), white count 10.6.  CT abdomen showed acute appendicitis with no perforation or fluid collections.  He was started on empiric IV Zosyn and given 0.5 IV Dilaudid for pain control.  [facility] hospital[facility] was called to admit in addition to obtaining cardiac clearance before any procedure by her surgical consultants.   Hospital Course    Acute appendicitis without peritonitis -Current, patient with recent MI, stent placement, Brilinta and aspirin, cardiology consult appreciated,  parent has been held for surgery, had to await few days until P2Y12 level  normalized before proceeding with surgery, shunt went for surgery 02/19/2018 by Dr. Dwain SarnaWakefield, tolerated procedure very well, diet, ambulating, he can be discharged this afternoon per my discussion with general surgery. -Was kept empirically on Zosyn during hospital stay pending his surgery, no further antibiotic needed on discharge.  CKD 2, stable Appears baseline cr 1.1 with GFR >60 Avoid nephrotoxic agents  Recent MI with stent placement Brilinta and aspirin were held for surgery, aspirin to be resumed on discharge, and presented in 48 hours per surgery recommendation  Chronic HFREF 45-50% Cardiology following No acute issues Last TTE on 12/2016 with EF 45 to 50% (previously 35% after recent STEMI as mentioned above) disease. Euvolemic on exam. -Cardiology recommends continuing his home Entresto, carvedilol, spironolactone at current dose -Daily weights, strict I's and O's  CAD status post inferior STEMI 07/2017 status post DES to RCA.  Complicated by pulseless VT for which he underwent CPR and underwent hypothermia protocolduring that admission. Also has history of stents after MI in 2017 [facility]( [place])Has been on aspirin and Brilinta since then . Reports adequate exercise tolerance able to go up 4 flights of steps without significant dyspnea or CP. -Please see discussion above regarding antiplatelet therapy    Discharge Condition:  Stable   Follow UP  Follow-up Information    Emelia LoronWakefield, Matthew, MD In 3 weeks.   Specialty:  General Surgery Contact information: [address] [place] [address] [phone]             Discharge Instructions  and  Discharge Medications     Allergies as of 02/19/2018   No Known Allergies     Medication List    TAKE these medications   amiodarone 200 MG tablet Commonly known as:  PACERONE Take 1 tablet (200 mg total) by mouth  daily.   aspirin EC 81 MG tablet Take 81 mg by mouth daily.   atorvastatin 80 MG tablet Commonly known as:  LIPITOR Take 1 tablet (80 mg total) by mouth daily at 6 PM.   carvedilol 6.25 MG tablet Commonly known as:  COREG Take 1.5 tablets (9.375 mg total) by mouth 2 (two) times daily with a meal. What changed:  how much to take   oxyCODONE 5 MG immediate release tablet Commonly known as:  Oxy IR/ROXICODONE Take 1 tablet (5 mg total) by mouth every 6 (six) hours as needed for moderate pain, severe pain or breakthrough pain.   sacubitril-valsartan 24-26 MG Commonly known as:  ENTRESTO Take 1 tablet by mouth 2 (two) times daily.   spironolactone 25 MG tablet Commonly known as:  ALDACTONE Take 0.5 tablets (12.5 mg total) by mouth daily.   ticagrelor 90 MG Tabs tablet Commonly  known as:  BRILINTA Take 1 tablet (90 mg total) by mouth 2 (two) times daily. Please start in 2 days, 02/22/2018 Start taking on:  02/22/2018 What changed:    additional instructions  These instructions start on 02/22/2018. If you are unsure what to do until then, ask your doctor or other care provider.         Diet and Activity recommendation: See Discharge Instructions above   Consults obtained -  Cardiology General syrgery   Major procedures and Radiology Reports - PLEASE review detailed and final reports for all details, in brief -    Procedure: Laparoscopic appendectomy 02/19/2018 Surgeon: Dr. Harden Mo  Ct Abdomen Pelvis W Contrast  Result Date: 02/15/2018 CLINICAL DATA:  42 y/o  M; abdominal pain with nausea. EXAM: CT ABDOMEN AND PELVIS WITH CONTRAST TECHNIQUE: Multidetector CT imaging of the abdomen and pelvis was performed using the standard protocol following bolus administration of intravenous contrast. CONTRAST:  OMNIPAQUE IOHEXOL 300 MG/ML  SOLN COMPARISON:  11/11/2017 CT abdomen pelvis. FINDINGS: Lower chest: No acute abnormality. Hepatobiliary: No focal liver  abnormality is seen. No gallstones, gallbladder wall thickening, or biliary dilatation. Pancreas: Unremarkable. No pancreatic ductal dilatation or surrounding inflammatory changes. Spleen: Normal in size without focal abnormality. Adrenals/Urinary Tract: Adrenal glands are unremarkable. Kidneys are normal, without renal calculi, focal lesion, or hydronephrosis. Bladder is unremarkable. Stomach/Bowel: Sigmoid diverticulosis. Normal appearance of the stomach. No obstructive or inflammatory changes of the small bowel and colon. Acute appendicitis. Appendix: Location: Inferior to cecum. Diameter: 13 mm Appendicolith: Negative. Mucosal hyper-enhancement: Positive. Extraluminal gas: Negative. Periappendiceal collection: Periappendiceal inflammation without abscess. Vascular/Lymphatic: No significant vascular findings are present. No enlarged abdominal or pelvic lymph nodes. Reproductive: Prostate is unremarkable. Other: Small stable periumbilical hernia containing fat. No abdominopelvic ascites. Musculoskeletal: No acute or significant osseous findings. IMPRESSION: 1. Acute appendicitis.  No findings of perforation or abscess. 2. Sigmoid diverticulosis, no evidence of acute diverticulitis. Electronically Signed   By: Mitzi Hansen M.D.   On: 02/15/2018 05:33    Micro Results     Recent Results (from the past 240 hour(s))  Surgical PCR screen     Status: None   Collection Time: 02/17/18  2:44 PM  Result Value Ref Range Status   MRSA, PCR NEGATIVE NEGATIVE Final   Staphylococcus aureus NEGATIVE NEGATIVE Final    Comment: (NOTE) The Xpert SA Assay (FDA approved for NASAL specimens in patients 59 years of age and older), is one component of a comprehensive surveillance program. It is not intended to diagnose infection nor to guide or monitor treatment. Performed at [facility], [address] [place], [place] [address]        Today   Subjective:   Griselda Tosh today has no  headache,no chest or  abdominal pain,no new weakness tingling or numbness, eating lunch, denies any complaints   Objective:   Blood pressure 98/63, pulse 78, temperature (!) 97.4 F (36.3 C), temperature source Oral, resp. rate 17, height 5\' 9"  (1.753 m), weight 92.4 kg, SpO2 97 %.   Intake/Output Summary (Last 24 hours) at 02/19/2018 1407 Last data filed at 02/19/2018 1345 Gross per 24 hour  Intake 2130 ml  Output 10 ml  Net 2120 ml    Exam Awake Alert, Oriented x 3, No new F.N deficits, Normal affect Symmetrical Chest wall movement, Good air movement bilaterally, CTAB RRR,No Gallops,Rubs or new Murmurs, No Parasternal Heave +ve B.Sounds, Abd Soft, Non tender,  No rebound -guarding or rigidity. No  Cyanosis, Clubbing or edema, No new Rash or bruise  Data Review   CBC w Diff:  Lab Results  Component Value Date   WBC 6.3 02/19/2018   HGB 13.0 02/19/2018   HCT 40.4 02/19/2018   PLT 281 02/19/2018   LYMPHOPCT 46 02/17/2018   MONOPCT 7 02/17/2018   EOSPCT 6 02/17/2018   BASOPCT 0 02/17/2018    CMP:  Lab Results  Component Value Date   NA 138 02/19/2018   K 3.7 02/19/2018   CL 108 02/19/2018   CO2 23 02/19/2018   BUN 13 02/19/2018   CREATININE 1.04 02/19/2018   PROT 8.1 02/15/2018   ALBUMIN 4.5 02/15/2018   BILITOT 1.2 02/15/2018   ALKPHOS 57 02/15/2018   AST 22 02/15/2018   ALT 36 02/15/2018  .   Total Time in preparing paper work, data evaluation and todays exam - 30 minutes  Huey Bienenstock M.D on 02/19/2018 at 2:07 PM  [facility]   Office  [phone]

## 2018-02-19 NOTE — Anesthesia Postprocedure Evaluation (Signed)
Anesthesia Post Note  Patient: Lee Moore  Procedure(s) Performed: APPENDECTOMY LAPAROSCOPIC (N/A Abdomen)     Patient location during evaluation: PACU Anesthesia Type: General Level of consciousness: awake and alert Pain management: pain level controlled Vital Signs Assessment: post-procedure vital signs reviewed and stable Respiratory status: spontaneous breathing, nonlabored ventilation, respiratory function stable and patient connected to nasal cannula oxygen Cardiovascular status: blood pressure returned to baseline and stable Postop Assessment: no apparent nausea or vomiting Anesthetic complications: no    Last Vitals:  Vitals:   02/19/18 1156 02/19/18 1344  BP: 114/79 98/63  Pulse: 67 78  Resp: 17 17  Temp: 36.7 C (!) 36.3 C  SpO2: 100% 97%    Last Pain:  Vitals:   02/19/18 1344  TempSrc: Oral  PainSc:                  Odesser Tourangeau

## 2018-02-20 ENCOUNTER — Encounter (HOSPITAL_COMMUNITY): Payer: Self-pay | Admitting: General Surgery

## 2018-03-03 ENCOUNTER — Telehealth (HOSPITAL_COMMUNITY): Payer: Self-pay

## 2018-03-03 NOTE — Telephone Encounter (Signed)
Called patient to see if he was interested in participating in the Cardiac Rehab Program. Patient stated yes. Patient will come in for orientation on 03/23/18 @ 730AM and will attend the 815AM exercise class.  Mailed homework package.

## 2018-03-13 ENCOUNTER — Telehealth (HOSPITAL_COMMUNITY): Payer: Self-pay

## 2018-03-14 ENCOUNTER — Ambulatory Visit: Payer: BLUE CROSS/BLUE SHIELD | Admitting: Cardiology

## 2018-03-14 ENCOUNTER — Encounter: Payer: Self-pay | Admitting: Cardiology

## 2018-03-14 ENCOUNTER — Telehealth (HOSPITAL_COMMUNITY): Payer: Self-pay | Admitting: Surgery

## 2018-03-14 VITALS — BP 110/64 | HR 54 | Ht 69.0 in | Wt 210.0 lb

## 2018-03-14 DIAGNOSIS — I493 Ventricular premature depolarization: Secondary | ICD-10-CM | POA: Diagnosis not present

## 2018-03-14 DIAGNOSIS — I255 Ischemic cardiomyopathy: Secondary | ICD-10-CM

## 2018-03-14 DIAGNOSIS — I5022 Chronic systolic (congestive) heart failure: Secondary | ICD-10-CM

## 2018-03-14 DIAGNOSIS — I2510 Atherosclerotic heart disease of native coronary artery without angina pectoris: Secondary | ICD-10-CM | POA: Diagnosis not present

## 2018-03-14 NOTE — Telephone Encounter (Signed)
Cardiac Rehab Medication Review by a Pharmacist  Does the patient  feel that his/her medications are working for him/her?  yes  Has the patient been experiencing any side effects to the medications prescribed?  no  Does the patient measure his/her own blood pressure or blood glucose at home?  yes "once every couple days"  Does the patient have any problems obtaining medications due to transportation or finances?   no  Understanding of regimen: good Understanding of indications: good Potential of compliance: good    Pharmacist comments: Pleasant 42 yo male who showed good understanding of his medication regimen. Phone connection was poor so some difficulty understanding him, but I believe we covered all information appropriately.  Ewing Schleinolton Brevan Luberto, PharmD PGY1 Pharmacy Resident 03/14/2018    4:13 PM

## 2018-03-14 NOTE — Patient Instructions (Signed)
Medication Instructions:  Your physician recommends that you continue on your current medications as directed. Please refer to the Current Medication list given to you today.  * If you need a refill on your cardiac medications before your next appointment, please call your pharmacy.   Labwork: None ordered  Testing/Procedures: Your physician has recommended that you wear a 48 hour holter monitor - 1-2 weeks prior to your follow up with Dr. Elberta Fortisamnitz. Holter monitors are medical devices that record the heart's electrical activity. Doctors most often use these monitors to diagnose arrhythmias. Arrhythmias are problems with the speed or rhythm of the heartbeat. The monitor is a small, portable device. You can wear one while you do your normal daily activities. This is usually used to diagnose what is causing palpitations/syncope (passing out).   Follow-Up: Your physician recommends that you schedule a follow-up appointment in: 3 months with Dr. Elberta Fortisamnitz (after holter monitor has been completed)  Thank you for choosing [facility]!!   Dory HornSherri Price, RN [phone]  Any Other Special Instructions Will Be Listed Below (If Applicable).  Cardiac Event Monitoring A cardiac event monitor is a small recording device that is used to detect abnormal heart rhythms (arrhythmias). The monitor is used to record your heart rhythm when you have symptoms, such as:  Fast heartbeats (palpitations), such as heart racing or fluttering.  Dizziness.  Fainting or light-headedness.  Unexplained weakness.  Some monitors are wired to electrodes placed on your chest. Electrodes are flat, sticky disks that attach to your skin. Other monitors may be hand-held or worn on the wrist. The monitor can be worn for up to 30 days. If the monitor is attached to your chest, a technician will prepare your chest for the electrode placement and show you how to work the monitor. Take time to practice using the monitor before  you leave the office. Make sure you understand how to send the information from the monitor to your health care provider. In some cases, you may need to use a landline telephone instead of a cell phone. What are the risks? Generally, this device is safe to use, but it possible that the skin under the electrodes will become irritated. How to use your cardiac event monitor  Wear your monitor at all times, except when you are in water: ? Do not let the monitor get wet. ? Take the monitor off when you bathe. Do not swim or use a hot tub with it on.  Keep your skin clean. Do not put body lotion or moisturizer on your chest.  Change the electrodes as told by your health care provider or any time they stop sticking to your skin. You may need to use medical tape to keep them on.  Try to put the electrodes in slightly different places on your chest to help prevent skin irritation. They must remain in the area under your left breast and in the upper right section of your chest.  Make sure the monitor is safely clipped to your clothing or in a location close to your body that your health care provider recommends.  Press the button to record as soon as you feel heart-related symptoms, such as: ? Dizziness. ? Weakness. ? Light-headedness. ? Palpitations. ? Thumping or pounding in your chest. ? Shortness of breath. ? Unexplained weakness.  Keep a diary of your activities, such as walking, doing chores, and taking medicine. It is very important to note what you were doing when you pushed the button to  record your symptoms. This will help your health care provider determine what might be contributing to your symptoms.  Send the recorded information as recommended by your health care provider. It may take some time for your health care provider to process the results.  Change the batteries as told by your health care provider.  Keep electronic devices away from your monitor. This  includes: ? Tablets. ? MP3 players. ? Cell phones.  While wearing your monitor you should avoid: ? Electric blankets. ? Firefighter. ? Electric toothbrushes. ? Microwave ovens. ? Magnets. ? Metal detectors. Get help right away if:  You have chest pain.  You have extreme difficulty breathing or shortness of breath.  You develop a very fast heartbeat that persists.  You develop dizziness that does not go away.  You faint or constantly feel like you are about to faint. Summary  A cardiac event monitor is a small recording device that is used to help detect abnormal heart rhythms (arrhythmias).  The monitor is used to record your heart rhythm when you have heart-related symptoms.  Make sure you understand how to send the information from the monitor to your health care provider.  It is important to press the button on the monitor when you have any heart-related symptoms.  Keep a diary of your activities, such as walking, doing chores, and taking medicine. It is very important to note what you were doing when you pushed the button to record your symptoms. This will help your health care provider learn what might be causing your symptoms. This information is not intended to replace advice given to you by your health care provider. Make sure you discuss any questions you have with your health care provider. Document Released: 12/23/2007 Document Revised: 02/28/2016 Document Reviewed: 02/28/2016 Elsevier Interactive Patient Education  2017 [employer].

## 2018-03-14 NOTE — Progress Notes (Signed)
Electrophysiology Office Note   Date:  03/14/2018   ID:  Lee Moore, DOB 07-23-1975, MRN [id number]  PCP:  Patient, No Pcp Per  Cardiologist:  Shirlee Latch Primary Electrophysiologist:  Will Jorja Loa, MD    No chief complaint on file.    History of Present Illness: Lee Moore is a 42 y.o. male who is being seen today for the evaluation of PVCs at the request of Duwaine Maxin. Presenting today for electrophysiology evaluation.  He has a history of coronary disease status post MI in 2017 and again in 07/2017.  He also has systolic heart failure.  In 2017, he is RCA was stented in [place].  07/2017 he had an inferior STEMI complicated by multiple episodes of pulseless VT.  He had CPR and ultimately went to the Cath Lab which showed a proximal RCA occlusion but minimal left-sided disease.  His ejection fraction at the time was 30 to 35% and he was discharged with a LifeVest.  Repeat echo shows an EF of 50 to 55%.  He subsequently wore a ZIO patch that showed 36% PVCs.  He has been having some shortness of breath and fatigue towards the end of the day.  He thinks that this might be due to his PVCs.  Today, denies symptoms of palpitations, chest pain, shortness of breath, orthopnea, PND, lower extremity edema, claudication, dizziness, presyncope, syncope, bleeding, or neurologic sequela. The patient is tolerating medications without difficulties.  Overall he is feeling well.  He has no chest pain or shortness of breath.  He did have an appendectomy in November.  He feels a little bit weak and fatigued but he is getting over an upper respiratory illness.   Past Medical History:  Diagnosis Date  . Coronary artery disease   . Essential hypertension   . History of cardiac arrest 07/30/2017   VT   . MI (myocardial infarction) [facility])    Past Surgical History:  Procedure Laterality Date  . CORONARY/GRAFT ACUTE MI REVASCULARIZATION N/A 07/30/2017   Procedure: Coronary/Graft Acute MI  Revascularization;  Surgeon: Rinaldo Cloud, MD;  Location: [facility] INVASIVE CV LAB;  Service: Cardiovascular;  Laterality: N/A;  . LAPAROSCOPIC APPENDECTOMY N/A 02/19/2018   Procedure: APPENDECTOMY LAPAROSCOPIC;  Surgeon: Emelia Loron, MD;  Location: [facility] OR;  Service: General;  Laterality: N/A;  . LEFT HEART CATH AND CORONARY ANGIOGRAPHY N/A 07/30/2017   Procedure: LEFT HEART CATH AND CORONARY ANGIOGRAPHY;  Surgeon: Rinaldo Cloud, MD;  Location: [facility] INVASIVE CV LAB;  Service: Cardiovascular;  Laterality: N/A;  . PVC ABLATION N/A 02/10/2018   Procedure: PVC ABLATION;  Surgeon: Regan Lemming, MD;  Location: [facility] INVASIVE CV LAB;  Service: Cardiovascular;  Laterality: N/A;     Current Outpatient Medications  Medication Sig Dispense Refill  . amiodarone (PACERONE) 200 MG tablet Take 1 tablet (200 mg total) by mouth daily. 60 tablet 3  . aspirin EC 81 MG tablet Take 81 mg by mouth daily.    Marland Kitchen atorvastatin (LIPITOR) 80 MG tablet Take 1 tablet (80 mg total) by mouth daily at 6 PM. 30 tablet 6  . carvedilol (COREG) 6.25 MG tablet Take 1.5 tablets (9.375 mg total) by mouth 2 (two) times daily with a meal. (Patient taking differently: Take 6.25 mg by mouth 2 (two) times daily with a meal. ) 90 tablet 3  . oxyCODONE (OXY IR/ROXICODONE) 5 MG immediate release tablet Take 1 tablet (5 mg total) by mouth every 6 (six) hours as needed for moderate pain, severe pain or breakthrough pain.  12 tablet 0  . sacubitril-valsartan (ENTRESTO) 24-26 MG Take 1 tablet by mouth 2 (two) times daily. 60 tablet 3  . spironolactone (ALDACTONE) 25 MG tablet Take 0.5 tablets (12.5 mg total) by mouth daily. 45 tablet 3  . ticagrelor (BRILINTA) 90 MG TABS tablet Take 1 tablet (90 mg total) by mouth 2 (two) times daily. Please start in 2 days, 02/22/2018     No current facility-administered medications for this visit.     Allergies:   Patient has no known allergies.   Social History:  The patient  reports that he has never  smoked. He has never used smokeless tobacco. He reports previous alcohol use. He reports that he does not use drugs.   Family History:  The patient's family history includes Hypertension in his mother.    ROS:  Please see the history of present illness.   Otherwise, review of systems is positive for none.   All other systems are reviewed and negative.   PHYSICAL EXAM: VS:  BP 110/64   Pulse (!) 54   Ht 5\' 9"  (1.753 m)   Wt 210 lb (95.3 kg)   BMI 31.01 kg/m  , BMI Body mass index is 31.01 kg/m. GEN: Well nourished, well developed, in no acute distress  HEENT: normal  Neck: no JVD, carotid bruits, or masses Cardiac: RRR; no murmurs, rubs, or gallops,no edema  Respiratory:  clear to auscultation bilaterally, normal work of breathing GI: soft, nontender, nondistended, + BS MS: no deformity or atrophy  Skin: warm and dry Neuro:  Strength and sensation are intact Psych: euthymic mood, full affect  EKG:  EKG is ordered today. Personal review of the ekg ordered shows sinus rhythm, PVCs  Recent Labs: 01/31/2018: TSH 1.320 02/15/2018: ALT 36 02/16/2018: Magnesium 2.0 02/19/2018: BUN 13; Creatinine, Ser 1.04; Hemoglobin 13.0; Platelets 281; Potassium 3.7; Sodium 138    Lipid Panel     Component Value Date/Time   CHOL 91 10/28/2017 0934   TRIG 53 10/28/2017 0934   HDL 51 10/28/2017 0934   CHOLHDL 1.8 10/28/2017 0934   VLDL 11 10/28/2017 0934   LDLCALC 29 10/28/2017 0934     Wt Readings from Last 3 Encounters:  03/14/18 210 lb (95.3 kg)  02/18/18 203 lb 12.8 oz (92.4 kg)  02/10/18 204 lb (92.5 kg)      Other studies Reviewed: Additional studies/ records that were reviewed today include: TTE 01/16/18  Review of the above records today demonstrates:  - Left ventricle: The cavity size was normal. There was mild   concentric hypertrophy. Systolic function was mildly reduced. The   estimated ejection fraction was in the range of 45% to 50%.   Hypokinesis of the  basal-midinferior and inferoseptal myocardium.   Doppler parameters are consistent with abnormal left ventricular   relaxation (grade 1 diastolic dysfunction). - Aortic valve: Transvalvular velocity was within the normal range.   There was no stenosis. There was no regurgitation. - Mitral valve: Transvalvular velocity was within the normal range.   There was no evidence for stenosis. There was mild regurgitation. - Right ventricle: The cavity size was normal. Wall thickness was   normal. Systolic function was normal. - Right atrium: The atrium was severely dilated. - Atrial septum: No defect or patent foramen ovale was identified   by color flow Doppler. - Tricuspid valve: There was mild regurgitation. - Pulmonary arteries: Systolic pressure was within the normal   range. PA peak pressure: 24 mm Hg (S). - Global lonitudinal strain -  10.6% (abnormal). However, PVCs were   noted as the images were acquired making the output less   accurate.  Monitor 01/12/18 - personally reviewed 1. Predominantly sinus.  2. Very frequent PVCs, 36.5% total beats. No VT.   ASSESSMENT AND PLAN:  1.  Chronic systolic heart failure due to ischemic cardiomyopathy: Currently on optimal medical therapy with Entresto, Coreg, and Aldactone.  Most recent ejection fraction was 45 to 50%.  No changes at this time.  2.  PVCs: Wore a ZIO patch with 36% PVCs.  He is currently on amiodarone.  He does have more frequent PVCs now as well.  PVCs do appear to have a different morphology, potentially coming from the outflow tracts.  We will stop amiodarone and repeat a monitor once it is washed out.  I will see him back in 3 months.  3.  Coronary artery disease status post MI: Status post multiple RCA stents.  No current chest pain.    Current medicines are reviewed at length with the patient today.   The patient does not have concerns regarding his medicines.  The following changes were made today: Stop amiodarone  Labs/  tests ordered today include:  Orders Placed This Encounter  Procedures  . EKG 12-Lead   Disposition:   FU with Will Camnitz 3 months  Signed, Will Jorja Loa, MD  03/14/2018 9:07 AM     [facility] [address] [place] [address] [phone] (office) [phone] (fax)

## 2018-03-14 NOTE — Telephone Encounter (Signed)
I sent the patient's short term disability paperwork to [employer] company per his request.

## 2018-03-14 NOTE — Addendum Note (Signed)
Addended by: Baird LyonsPRICE, Jazzma Neidhardt L on: 03/14/2018 09:27 AM   Modules accepted: Orders

## 2018-03-15 ENCOUNTER — Encounter (HOSPITAL_COMMUNITY): Payer: Self-pay | Admitting: Cardiology

## 2018-03-15 ENCOUNTER — Ambulatory Visit (HOSPITAL_COMMUNITY)
Admission: RE | Admit: 2018-03-15 | Discharge: 2018-03-15 | Disposition: A | Discharge status: Home / Self Care | Payer: BLUE CROSS/BLUE SHIELD | Source: Ambulatory Visit | Attending: Cardiology | Admitting: Cardiology

## 2018-03-15 VITALS — BP 114/60 | HR 60 | Wt 208.6 lb

## 2018-03-15 DIAGNOSIS — I255 Ischemic cardiomyopathy: Secondary | ICD-10-CM | POA: Diagnosis not present

## 2018-03-15 DIAGNOSIS — R9431 Abnormal electrocardiogram [ECG] [EKG]: Secondary | ICD-10-CM | POA: Insufficient documentation

## 2018-03-15 DIAGNOSIS — I5022 Chronic systolic (congestive) heart failure: Secondary | ICD-10-CM

## 2018-03-15 DIAGNOSIS — I493 Ventricular premature depolarization: Secondary | ICD-10-CM | POA: Diagnosis not present

## 2018-03-15 DIAGNOSIS — R001 Bradycardia, unspecified: Secondary | ICD-10-CM | POA: Diagnosis not present

## 2018-03-15 DIAGNOSIS — R570 Cardiogenic shock: Secondary | ICD-10-CM

## 2018-03-15 LAB — BASIC METABOLIC PANEL
ANION GAP: 9 (ref 5–15)
BUN: 9 mg/dL (ref 6–20)
CALCIUM: 9.3 mg/dL (ref 8.9–10.3)
CO2: 21 mmol/L — ABNORMAL LOW (ref 22–32)
Chloride: 108 mmol/L (ref 98–111)
Creatinine, Ser: 1 mg/dL (ref 0.61–1.24)
Glucose, Bld: 114 mg/dL — ABNORMAL HIGH (ref 70–99)
POTASSIUM: 4.5 mmol/L (ref 3.5–5.1)
Sodium: 138 mmol/L (ref 135–145)

## 2018-03-15 MED ORDER — CARVEDILOL 6.25 MG PO TABS: 12.5000 mg | ORAL_TABLET | Freq: Two times a day (BID) | ORAL | 3 refills | Status: DC

## 2018-03-15 NOTE — Patient Instructions (Signed)
Today you have been seen at the Heart failure clinic at [facility]   Medication changes:  Increase Coreg to 12.5 mg twice a day   Lab work was done today:  We will call you if your lab work is abnormal.. No news is good news!!   Follow up with Lee Moore in 3 months.

## 2018-03-16 NOTE — Progress Notes (Signed)
HF Cardiology: Dr. Shirlee LatchMcLean  42 yo with history of CAD s/p MI in 2017 and again in 5/19 presents for followup of CHF and CAD.  Patient had initial MI in [place] in 2017, he reports a total of 5 stents to the RCA system. He recently moved to [place] with work.  In 5/19, he had inferior STEMI complicated by multiple episodes of pulseless VT.  He had CPR and ultimately when to the cath lab.  This showed occluded proximal RCA but minimal disease on the left.  The proximal to mid RCA were stented, restoring flow to an acute marginal.  However, the distal RCA, PLV and PDA remained occluded. There were collaterals to the PLV from the left suggesting pre-existing disease.  He underwent hypothermia protocol and had full neurologic recovery from arrest.  Echo in 5/19 showed EF 30-35%.  Cardiac medications were titrated up and he was discharged with a Lifevest.    Repeat echo in 7/19 showed EF improved to 50-55%.    In 8/19, he was admitted with acute appendicitis.  He was managed medically with antibiotics.  Pain resolved, and it was decided not to operate on him immediately due to recent PCI.  He has not had any further appendicitis-type pain.  However,after this, he developed profuse diarrhea and was diagnosed with C difficile colitis.  He was treated with po vancomycin with resolution.     At prior appointment, he was noted to have frequent PVCs. Zio patch showed 36% PVCs.  Echo was done in 10/19, showing EF 45-50%, inferior hypokinesis.  Lexiscan Cardiolite was done, showing fixed inferior defect, no ischemia.  I started him on amiodarone and sent him to see EP, who talked with him about option of PVC ablation. He had PVC ablation in 11/19.   Also in 11/19, he had recurrent appendicitis with laparoscopic appendectomy.   He returns for followup of CAD, PVCs, and ischemic cardiomyopathy.  Amiodarone was stopped yesterday by Dr. Elberta Fortisamnitz and plan is to have him wear a holter again to quantify PVCs.  He is doing  well in general.  Walking for exercise for 30 minutes a day, no significant exertional dyspnea.  No chest pain.  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    ECG (personally reviewed): sinus brady at 54 with PVCs  Labs (5/19): K 4.2 => 4.8, creatinine 1.54 => 1.38, hgb 11.7, AST 97, ALT 151 Labs (6/19): K 4.3, creatinine 1.16 => 1.08, LFTs normal Labs (8/19): LDL 29, HDL 51, TGs 53 Labs (9/19): K 3.7, creatinine 1.12 Labs (10/19): Mg 1.9, K 4.5, creatinine 1.16, uric acid 9 Labs (11/19): K 3.7, creatinine 1.04  PMH: 1. CAD: MI in 2017, per report patient had a total of 5 stents in the RCA system.  - Inferior STEMI 5/19: LHC showed occluded RCA with relatively clear left coronary system. DES to proximal-mid RCA restoring flow to an acute marginal, however, the distal RCA/PLV/PDA remained occluded.  There were collaterals to the PLV from the left.   - Lexiscan Cardiolite (10/19): Fixed inferior defect, no ischemia.  2. VT: Patient had pulseless VT in setting of inferior STEMI.   3. Chronic systolic CHF: Ischemic cardiomyopathy.   - Echo (5/19): EF 30-35%, inferior WMA.   - Echo (7/19): EF 50-55% - Echo (10/19): EF 45-50%, mild LVH, inferior hypokinesis, mild MR, normal RV size and systolic function.  4. CKD: Stage II.  5. Acute appendicitis: Managed medically in 8/19.  - Laparoscopic appendectomy in  11/19.  6. C difficile diarrhea 8/19-9/19 7. Gout 8. PVCs: Zio Patch in 10/19 with 36% PVCs.  - PVC ablation in 11/19.   FH: No premature CAD.   SH: Married with a child.  Works for [employer].  [place]oved to [place] from [place].  No smoking, rare ETOH.   ROS: All systems reviewed and negative except as per HPI.   Current Outpatient Medications  Medication Sig Dispense Refill  . aspirin EC 81 MG tablet Take 81 mg by mouth daily.    Marland Kitchen. atorvastatin (LIPITOR) 80 MG tablet Take 1 tablet (80 mg total) by mouth daily at 6 PM. 30 tablet 6  . carvedilol (COREG) 6.25 MG  tablet Take 2 tablets (12.5 mg total) by mouth 2 (two) times daily with a meal. 90 tablet 3  . sacubitril-valsartan (ENTRESTO) 24-26 MG Take 1 tablet by mouth 2 (two) times daily. 60 tablet 3  . spironolactone (ALDACTONE) 25 MG tablet Take 0.5 tablets (12.5 mg total) by mouth daily. 45 tablet 3  . ticagrelor (BRILINTA) 90 MG TABS tablet Take 1 tablet (90 mg total) by mouth 2 (two) times daily. Please start in 2 days, 02/22/2018     No current facility-administered medications for this encounter.    BP 114/60   Pulse 60   Wt 94.6 kg (208 lb 9.6 oz)   SpO2 97%   BMI 30.80 kg/m  General: NAD Neck: No JVD, no thyromegaly or thyroid nodule.  Lungs: Clear to auscultation bilaterally with normal respiratory effort. CV: Nondisplaced PMI.  Heart regular S1/S2, no S3/S4, no murmur.  No peripheral edema.  No carotid bruit.  Normal pedal pulses.  Abdomen: Soft, nontender, no hepatosplenomegaly, no distention.  Skin: Intact without lesions or rashes.  Neurologic: Alert and oriented x 3.  Psych: Normal affect. Extremities: No clubbing or cyanosis.  HEENT: Normal.   Assessment/Plan: 1. CAD: Initial MI in 2017, 5 stents to RCA system.  Inferior MI in 5/19, DES to proximal/mid RCA but distal RCA and PLV/PDA remained occluded.  No good options for opening. Left coronary system had minimal disease. No ischemic chest pain.  Lexiscan Cardiolite in 10/19 with inferior scar, no ischemia.  - Continue ASA 81 daily and ticagrelor 90 bid.  - Continue atorvastatin 80 mg daily, good lipids in 8/19.   2. Chronic systolic CHF: Ischemic cardiomyopathy.  NYHA class II, he is not volume overloaded on exam.  Echo in 10/19 showed EF 45-50%.   - Continue Entresto 24/26 bid.  - Increase Coreg to 12.5 mg bid now that he is off amiodarone.  - Continue spironolactone 12.5 mg daily.  - Not volume overloaded, no need for Lasix. - BMET today.  - EF is out of range for ICD.  3. VT: Pulseless VT with CPR peri-MI.  He underwent  hypothermia protocol.  EF has recovered to 45-50%.   4. 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  Echo did not show significant change in LV systolic function and Cardiolite did not show ischemia.  He has had PVC ablation and is now off amiodarone.  - Plan for holter monitor to quantify PVCs post-ablation.   Followup in 3 months.   Marca AnconaDalton Raynell Scott 03/16/2018

## 2018-03-20 NOTE — Progress Notes (Signed)
Lee DakinCory Moore 42 y.o. male DOB November 14, 1975 MRN [id number]       Nutrition  No diagnosis found. Past Medical History:  Diagnosis Date  . Coronary artery disease   . Essential hypertension   . History of cardiac arrest 07/30/2017   VT   . MI (myocardial infarction) (HCC)    Meds reviewed.     Current Outpatient Medications (Cardiovascular):  .  atorvastatin (LIPITOR) 80 MG tablet, Take 1 tablet (80 mg total) by mouth daily at 6 PM. .  carvedilol (COREG) 6.25 MG tablet, Take 2 tablets (12.5 mg total) by mouth 2 (two) times daily with a meal. .  sacubitril-valsartan (ENTRESTO) 24-26 MG, Take 1 tablet by mouth 2 (two) times daily. Lee Moore.  spironolactone (ALDACTONE) 25 MG tablet, Take 0.5 tablets (12.5 mg total) by mouth daily.   Current Outpatient Medications (Analgesics):  .  aspirin EC 81 MG tablet, Take 81 mg by mouth daily.  Current Outpatient Medications (Hematological):  .  ticagrelor (BRILINTA) 90 MG TABS tablet, Take 1 tablet (90 mg total) by mouth 2 (two) times daily. Please start in 2 days, 02/22/2018    HT: Ht Readings from Last 1 Encounters:  03/14/18 5\' 9"  (1.753 m)    WT: Wt Readings from Last 5 Encounters:  03/15/18 208 lb 9.6 oz (94.6 kg)  03/14/18 210 lb (95.3 kg)  02/18/18 203 lb 12.8 oz (92.4 kg)  02/10/18 204 lb (92.5 kg)  01/31/18 207 lb 3.2 oz (94 kg)     BMI= 31  03/14/18  Current tobacco use? No       Labs:  Lipid Panel     Component Value Date/Time   CHOL 91 10/28/2017 0934   TRIG 53 10/28/2017 0934   HDL 51 10/28/2017 0934   CHOLHDL 1.8 10/28/2017 0934   VLDL 11 10/28/2017 0934   LDLCALC 29 10/28/2017 0934    Lab Results  Component Value Date   HGBA1C 5.6 07/30/2017   CBG (last 3)  No results for input(s): GLUCAP in the last 72 hours.  Nutrition Diagnosis ? Food-and nutrition-related knowledge deficit related to lack of exposure to information as related to diagnosis of: ? CVD ? Obese  I = 30-34.9 related to excessive energy intake as  evidenced by a BMI = 31  Nutrition Goal(s):  ? To be determined  Plan:  Pt to attend nutrition classes ? Nutrition I ? Nutrition II ? Portion Distortion  Will provide client-centered nutrition education as part of interdisciplinary care.   Monitor and evaluate progress toward nutrition goal with team.  Lee MarcusAubrey Burklin, MS, RD, LDN 03/20/2018 1:59 PM

## 2018-03-23 ENCOUNTER — Inpatient Hospital Stay (HOSPITAL_COMMUNITY)
Admission: RE | Admit: 2018-03-23 | Discharge: 2018-03-23 | Disposition: A | Discharge status: Home / Self Care | Payer: BLUE CROSS/BLUE SHIELD | Source: Ambulatory Visit

## 2018-03-31 ENCOUNTER — Ambulatory Visit (HOSPITAL_COMMUNITY): Payer: BLUE CROSS/BLUE SHIELD

## 2018-04-03 ENCOUNTER — Ambulatory Visit (HOSPITAL_COMMUNITY): Payer: BLUE CROSS/BLUE SHIELD

## 2018-04-05 ENCOUNTER — Encounter (HOSPITAL_COMMUNITY): Payer: Self-pay | Admitting: *Deleted

## 2018-04-05 ENCOUNTER — Ambulatory Visit (HOSPITAL_COMMUNITY): Payer: BLUE CROSS/BLUE SHIELD

## 2018-04-05 ENCOUNTER — Telehealth (HOSPITAL_COMMUNITY): Payer: Self-pay | Admitting: *Deleted

## 2018-04-05 NOTE — Telephone Encounter (Signed)
Per Dr.McLean patient requests a call from our office regarding FMLA paperwork. I called patient to get more information. No answer/left VM for pt to call back

## 2018-04-07 ENCOUNTER — Ambulatory Visit (HOSPITAL_COMMUNITY): Payer: BLUE CROSS/BLUE SHIELD

## 2018-04-10 ENCOUNTER — Ambulatory Visit (HOSPITAL_COMMUNITY): Payer: BLUE CROSS/BLUE SHIELD

## 2018-04-12 ENCOUNTER — Ambulatory Visit (HOSPITAL_COMMUNITY): Payer: BLUE CROSS/BLUE SHIELD

## 2018-04-14 ENCOUNTER — Ambulatory Visit (HOSPITAL_COMMUNITY): Payer: BLUE CROSS/BLUE SHIELD

## 2018-04-17 ENCOUNTER — Ambulatory Visit (HOSPITAL_COMMUNITY): Payer: BLUE CROSS/BLUE SHIELD

## 2018-04-19 ENCOUNTER — Ambulatory Visit (HOSPITAL_COMMUNITY): Payer: BLUE CROSS/BLUE SHIELD

## 2018-04-21 ENCOUNTER — Ambulatory Visit (HOSPITAL_COMMUNITY): Payer: BLUE CROSS/BLUE SHIELD

## 2018-04-24 ENCOUNTER — Ambulatory Visit (HOSPITAL_COMMUNITY): Payer: BLUE CROSS/BLUE SHIELD

## 2018-04-26 ENCOUNTER — Telehealth (HOSPITAL_COMMUNITY): Payer: Self-pay | Admitting: *Deleted

## 2018-04-26 ENCOUNTER — Ambulatory Visit (HOSPITAL_COMMUNITY): Payer: BLUE CROSS/BLUE SHIELD

## 2018-04-26 ENCOUNTER — Other Ambulatory Visit (HOSPITAL_COMMUNITY): Payer: Self-pay | Admitting: Student

## 2018-04-26 NOTE — Telephone Encounter (Signed)
Faxed "progress report" to [facility] at [phone].

## 2018-04-28 ENCOUNTER — Ambulatory Visit (HOSPITAL_COMMUNITY): Payer: BLUE CROSS/BLUE SHIELD

## 2018-04-28 ENCOUNTER — Ambulatory Visit: Payer: BLUE CROSS/BLUE SHIELD | Admitting: Cardiology

## 2018-05-01 ENCOUNTER — Ambulatory Visit (HOSPITAL_COMMUNITY): Payer: BLUE CROSS/BLUE SHIELD

## 2018-05-03 ENCOUNTER — Ambulatory Visit (HOSPITAL_COMMUNITY): Payer: BLUE CROSS/BLUE SHIELD

## 2018-05-05 ENCOUNTER — Ambulatory Visit (HOSPITAL_COMMUNITY): Payer: BLUE CROSS/BLUE SHIELD

## 2018-05-08 ENCOUNTER — Ambulatory Visit (HOSPITAL_COMMUNITY): Payer: BLUE CROSS/BLUE SHIELD

## 2018-05-10 ENCOUNTER — Ambulatory Visit (HOSPITAL_COMMUNITY): Payer: BLUE CROSS/BLUE SHIELD

## 2018-05-11 ENCOUNTER — Encounter (INDEPENDENT_AMBULATORY_CARE_PROVIDER_SITE_OTHER): Payer: Self-pay

## 2018-05-11 ENCOUNTER — Encounter: Payer: Self-pay | Admitting: *Deleted

## 2018-05-11 ENCOUNTER — Ambulatory Visit (INDEPENDENT_AMBULATORY_CARE_PROVIDER_SITE_OTHER): Payer: BLUE CROSS/BLUE SHIELD

## 2018-05-11 DIAGNOSIS — I493 Ventricular premature depolarization: Secondary | ICD-10-CM | POA: Diagnosis not present

## 2018-05-12 ENCOUNTER — Ambulatory Visit (HOSPITAL_COMMUNITY): Payer: BLUE CROSS/BLUE SHIELD

## 2018-05-12 ENCOUNTER — Other Ambulatory Visit: Payer: Self-pay

## 2018-05-12 ENCOUNTER — Other Ambulatory Visit (HOSPITAL_COMMUNITY): Payer: Self-pay

## 2018-05-12 ENCOUNTER — Ambulatory Visit (HOSPITAL_COMMUNITY)
Admission: RE | Admit: 2018-05-12 | Discharge: 2018-05-12 | Disposition: A | Discharge status: Home / Self Care | Payer: BLUE CROSS/BLUE SHIELD | Source: Ambulatory Visit | Attending: Cardiology | Admitting: Cardiology

## 2018-05-12 ENCOUNTER — Encounter (HOSPITAL_COMMUNITY): Payer: Self-pay | Admitting: Cardiology

## 2018-05-12 VITALS — BP 118/72 | HR 44 | Wt 209.8 lb

## 2018-05-12 DIAGNOSIS — N182 Chronic kidney disease, stage 2 (mild): Secondary | ICD-10-CM | POA: Diagnosis not present

## 2018-05-12 DIAGNOSIS — A0472 Enterocolitis due to Clostridium difficile, not specified as recurrent: Secondary | ICD-10-CM | POA: Insufficient documentation

## 2018-05-12 DIAGNOSIS — Z7982 Long term (current) use of aspirin: Secondary | ICD-10-CM | POA: Insufficient documentation

## 2018-05-12 DIAGNOSIS — I255 Ischemic cardiomyopathy: Secondary | ICD-10-CM | POA: Diagnosis not present

## 2018-05-12 DIAGNOSIS — I2510 Atherosclerotic heart disease of native coronary artery without angina pectoris: Secondary | ICD-10-CM | POA: Diagnosis not present

## 2018-05-12 DIAGNOSIS — Z79899 Other long term (current) drug therapy: Secondary | ICD-10-CM | POA: Insufficient documentation

## 2018-05-12 DIAGNOSIS — M109 Gout, unspecified: Secondary | ICD-10-CM | POA: Insufficient documentation

## 2018-05-12 DIAGNOSIS — Z7902 Long term (current) use of antithrombotics/antiplatelets: Secondary | ICD-10-CM | POA: Insufficient documentation

## 2018-05-12 DIAGNOSIS — I493 Ventricular premature depolarization: Secondary | ICD-10-CM | POA: Insufficient documentation

## 2018-05-12 DIAGNOSIS — I252 Old myocardial infarction: Secondary | ICD-10-CM | POA: Insufficient documentation

## 2018-05-12 DIAGNOSIS — I5022 Chronic systolic (congestive) heart failure: Secondary | ICD-10-CM | POA: Insufficient documentation

## 2018-05-12 MED ORDER — SERTRALINE HCL 25 MG PO TABS: 25.0000 mg | ORAL_TABLET | Freq: Every day | ORAL | 5 refills | Status: DC

## 2018-05-12 MED ORDER — SACUBITRIL-VALSARTAN 49-51 MG PO TABS: 1.0000 | ORAL_TABLET | Freq: Two times a day (BID) | ORAL | 3 refills | Status: DC

## 2018-05-12 NOTE — Patient Instructions (Signed)
Labs drawn today. We will call you if they come back abnormal.  Labs in 2 weeks with follow up with Dr. Shirlee Latch in 2 months.  INCREASED Entresto to 49/51 mg twice daily.  START taking Sertraline 25 mg daily.

## 2018-05-14 NOTE — Progress Notes (Signed)
[facility]: Dr. Shirlee Latch  43 y.o. with history of CAD s/p MI in 2017 and again in 5/19 presents for followup of CHF and CAD.  Patient had initial MI in [place] in 2017, he reports a total of 5 stents to the RCA system. He recently moved to [place] with work.  In 5/19, he had inferior STEMI complicated by multiple episodes of pulseless VT.  He had CPR and ultimately when to the cath lab.  This showed occluded proximal RCA but minimal disease on the left.  The proximal to mid RCA were stented, restoring flow to an acute marginal.  However, the distal RCA, PLV and PDA remained occluded. There were collaterals to the PLV from the left suggesting pre-existing disease.  He underwent hypothermia protocol and had full neurologic recovery from arrest.  Echo in 5/19 showed EF 30-35%.  Cardiac medications were titrated up and he was discharged with a Lifevest.    Repeat echo in 7/19 showed EF improved to 50-55%.    In 8/19, he was admitted with acute appendicitis.  He was managed medically with antibiotics.  Pain resolved, and it was decided not to operate on him immediately due to recent PCI.  He has not had any further appendicitis-type pain.  However,after this, he developed profuse diarrhea and was diagnosed with C difficile colitis.  He was treated with po vancomycin with resolution.     At prior appointment, he was noted to have frequent PVCs. Zio patch showed 36% PVCs.  Echo was done in 10/19, showing EF 45-50%, inferior hypokinesis.  Lexiscan Cardiolite was done, showing fixed inferior defect, no ischemia.  I started him on amiodarone and sent him to see EP, who talked with him about option of PVC ablation. He had PVC ablation in 11/19. He is now off amiodarone.   Also in 11/19, he had recurrent appendicitis with laparoscopic appendectomy.   He returns for followup of CAD, PVCs, and ischemic cardiomyopathy.  He is wearing a holter monitor currently to re-quantify PVCs post-ablation.  He has been doing  well generally, trying to get some exercise, mainly walking.  He is back and work full time.  Has generalized anxiety and asks about treatment for this.  No orthopnea/PND. No chest pain.  Weight stable. Occasional mild lightheadedness with standing.     ECG (personally reviewed): NSR, PVCs rate 65  Labs (5/19): K 4.2 => 4.8, creatinine 1.54 => 1.38, hgb 11.7, AST 97, ALT 151 Labs (6/19): K 4.3, creatinine 1.16 => 1.08, LFTs normal Labs (8/19): LDL 29, HDL 51, TGs 53 Labs (9/19): K 3.7, creatinine 1.12 Labs (10/19): Mg 1.9, K 4.5, creatinine 1.16, uric acid 9 Labs (11/19): K 3.7, creatinine 1.04 Labs (12/19): K 4.5, creatinine 1.0  PMH: 1. CAD: MI in 2017, per report patient had a total of 5 stents in the RCA system.  - Inferior STEMI 5/19: LHC showed occluded RCA with relatively clear left coronary system. DES to proximal-mid RCA restoring flow to an acute marginal, however, the distal RCA/PLV/PDA remained occluded.  There were collaterals to the PLV from the left.   - Lexiscan Cardiolite (10/19): Fixed inferior defect, no ischemia.  2. VT: Patient had pulseless VT in setting of inferior STEMI.   3. Chronic systolic CHF: Ischemic cardiomyopathy.   - Echo (5/19): EF 30-35%, inferior WMA.   - Echo (7/19): EF 50-55% - Echo (10/19): EF 45-50%, mild LVH, inferior hypokinesis, mild MR, normal RV size and systolic function.  4. CKD: Stage II.  5.  Acute appendicitis: Managed medically in 8/19.  - Laparoscopic appendectomy in 11/19.  6. C difficile diarrhea 8/19-9/19 7. Gout 8. PVCs: Zio Patch in 10/19 with 36% PVCs.  - PVC ablation in 11/19.   FH: No premature CAD.   SH: Married with a child.  Works for [employer].  Moved to [place] from [place].  No smoking, rare ETOH.   ROS: All systems reviewed and negative except as per HPI.   Current Outpatient Medications  Medication Sig Dispense Refill  . aspirin EC 81 MG tablet Take 81 mg by mouth daily.    Marland Kitchen atorvastatin (LIPITOR) 80 MG tablet  TAKE 1 TABLET BY MOUTH EVERY DAY AT 6 PM 30 tablet 11  . carvedilol (COREG) 6.25 MG tablet Take 2 tablets (12.5 mg total) by mouth 2 (two) times daily with a meal. 90 tablet 3  . spironolactone (ALDACTONE) 25 MG tablet Take 0.5 tablets (12.5 mg total) by mouth daily. 45 tablet 3  . ticagrelor (BRILINTA) 90 MG TABS tablet Take 1 tablet (90 mg total) by mouth 2 (two) times daily. Please start in 2 days, 02/22/2018    . sacubitril-valsartan (ENTRESTO) 49-51 MG Take 1 tablet by mouth 2 (two) times daily. 60 tablet 3  . sertraline (ZOLOFT) 25 MG tablet Take 1 tablet (25 mg total) by mouth daily. 30 tablet 5   No current facility-administered medications for this encounter.    BP 118/72   Pulse (!) 44   Wt 95.2 kg (209 lb 12.8 oz)   SpO2 100%   BMI 30.98 kg/m  General: NAD Neck: No JVD, no thyromegaly or thyroid nodule.  Lungs: Clear to auscultation bilaterally with normal respiratory effort. CV: Nondisplaced PMI.  Heart regular S1/S2, no S3/S4, no murmur.  No peripheral edema.  No carotid bruit.  Normal pedal pulses.  Abdomen: Soft, nontender, no hepatosplenomegaly, no distention.  Skin: Intact without lesions or rashes.  Neurologic: Alert and oriented x 3.  Psych: Normal affect. Extremities: No clubbing or cyanosis.  HEENT: Normal.   Assessment/Plan: 1. CAD: Initial MI in 2017, 5 stents to RCA system.  Inferior MI in 5/19, DES to proximal/mid RCA but distal RCA and PLV/PDA remained occluded.  No good options for opening. Left coronary system had minimal disease. No ischemic chest pain.  Lexiscan Cardiolite in 10/19 with inferior scar, no ischemia.  - Continue ASA 81 daily and ticagrelor 90 bid. Transition to Plavix in 5/20 for long-term.  - Continue atorvastatin 80 mg daily, good lipids in 8/19.   - Planning to start cardiac rehab.  2. Chronic systolic CHF: Ischemic cardiomyopathy.  NYHA class II, he is not volume overloaded on exam.  Echo in 10/19 showed EF 45-50%.   - Increase Entresto  to 49/51 bid. BMET today and again in 2 wks.   - Continue Coreg 12.5 mg bid.  - Continue spironolactone 25 mg daily.  - Not volume overloaded, no need for Lasix. - EF is out of range for ICD.  3. VT: Pulseless VT with CPR peri-MI.  He underwent hypothermia protocol.  EF has recovered to 45-50%.   4. 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  Echo did not show significant change in LV systolic function and Cardiolite did not show ischemia.  He has had PVC ablation and is now off amiodarone.  - Currently wearing holter to re-quantify PVCs.   Followup in 2 months.   Marca Ancona 05/14/2018

## 2018-05-15 ENCOUNTER — Ambulatory Visit (HOSPITAL_COMMUNITY): Payer: BLUE CROSS/BLUE SHIELD

## 2018-05-17 ENCOUNTER — Ambulatory Visit (HOSPITAL_COMMUNITY): Payer: BLUE CROSS/BLUE SHIELD

## 2018-05-19 ENCOUNTER — Ambulatory Visit (HOSPITAL_COMMUNITY): Payer: BLUE CROSS/BLUE SHIELD

## 2018-05-22 ENCOUNTER — Ambulatory Visit (HOSPITAL_COMMUNITY): Payer: BLUE CROSS/BLUE SHIELD

## 2018-05-24 ENCOUNTER — Ambulatory Visit (HOSPITAL_COMMUNITY): Payer: BLUE CROSS/BLUE SHIELD

## 2018-05-26 ENCOUNTER — Ambulatory Visit (HOSPITAL_COMMUNITY): Payer: BLUE CROSS/BLUE SHIELD

## 2018-05-26 ENCOUNTER — Other Ambulatory Visit (HOSPITAL_COMMUNITY): Payer: BLUE CROSS/BLUE SHIELD

## 2018-05-29 ENCOUNTER — Ambulatory Visit (HOSPITAL_COMMUNITY): Payer: BLUE CROSS/BLUE SHIELD

## 2018-05-31 ENCOUNTER — Ambulatory Visit (HOSPITAL_COMMUNITY): Payer: BLUE CROSS/BLUE SHIELD

## 2018-06-02 ENCOUNTER — Ambulatory Visit (HOSPITAL_COMMUNITY): Payer: BLUE CROSS/BLUE SHIELD

## 2018-06-05 ENCOUNTER — Ambulatory Visit (HOSPITAL_COMMUNITY): Payer: BLUE CROSS/BLUE SHIELD

## 2018-06-06 ENCOUNTER — Encounter: Payer: Self-pay | Admitting: *Deleted

## 2018-06-07 ENCOUNTER — Ambulatory Visit (HOSPITAL_COMMUNITY): Payer: BLUE CROSS/BLUE SHIELD

## 2018-06-09 ENCOUNTER — Ambulatory Visit (HOSPITAL_COMMUNITY): Payer: BLUE CROSS/BLUE SHIELD

## 2018-06-12 ENCOUNTER — Ambulatory Visit (HOSPITAL_COMMUNITY): Payer: BLUE CROSS/BLUE SHIELD

## 2018-06-14 ENCOUNTER — Ambulatory Visit (HOSPITAL_COMMUNITY): Payer: BLUE CROSS/BLUE SHIELD

## 2018-06-14 ENCOUNTER — Encounter (HOSPITAL_COMMUNITY): Payer: BLUE CROSS/BLUE SHIELD | Admitting: Cardiology

## 2018-06-15 ENCOUNTER — Other Ambulatory Visit (HOSPITAL_COMMUNITY): Payer: Self-pay | Admitting: Cardiology

## 2018-06-15 ENCOUNTER — Other Ambulatory Visit: Payer: Self-pay | Admitting: Student

## 2018-06-16 ENCOUNTER — Other Ambulatory Visit (HOSPITAL_COMMUNITY): Payer: Self-pay

## 2018-06-16 ENCOUNTER — Telehealth: Payer: Self-pay | Admitting: Cardiology

## 2018-06-16 ENCOUNTER — Ambulatory Visit (HOSPITAL_COMMUNITY): Payer: BLUE CROSS/BLUE SHIELD

## 2018-06-16 ENCOUNTER — Ambulatory Visit: Payer: BLUE CROSS/BLUE SHIELD | Admitting: Cardiology

## 2018-06-16 MED ORDER — MEXILETINE HCL 150 MG PO CAPS: 150.0000 mg | ORAL_CAPSULE | Freq: Two times a day (BID) | ORAL | 2 refills | Status: DC

## 2018-06-16 MED ORDER — SERTRALINE HCL 50 MG PO TABS: 50.0000 mg | ORAL_TABLET | Freq: Every day | ORAL | 5 refills | Status: DC

## 2018-06-16 MED ORDER — MEXILETINE HCL 150 MG PO CAPS: 150.0000 mg | ORAL_CAPSULE | Freq: Two times a day (BID) | ORAL | 0 refills | Status: DC

## 2018-06-16 NOTE — Telephone Encounter (Signed)
Pt currently out of town in [place] for work.  Mexiletine 30 day Rx sent to local pharmacy and another sent to [facility] pharmacy per pt request. Pt will call if any issues/SE begin after starting new medication. 3 mo f/u scheduled for 09/19/18. Patient verbalized understanding and agreeable to plan.

## 2018-06-16 NOTE — Telephone Encounter (Signed)
pt no longer on this dose

## 2018-06-16 NOTE — Telephone Encounter (Signed)
Sertraline 50mg  escribed to pharmacy per Dr. Shirlee Latch. Pt waife made ware.

## 2018-06-16 NOTE — Telephone Encounter (Signed)
Had a phone visit with the patient today.  He is currently feeling well with complaints of only shortness of breath with exertion.  He is feeling well at rest.  His cardiac monitor does show high burden of PVCs at 42%.  He was previously taken off of amiodarone and also had a prior ablation in his lateral inferior RV for PVCs.  At this point, since he is short of breath, we Adelaide Pfefferkorn start him on mexiletine 150 mg twice a day.  He does have multiple PVCs noted on his monitor, but he does have one dominant morphology.  Hilty not control his PVC burden, would likely plan for repeat ablation.  Loman Brooklyn, MD

## 2018-06-16 NOTE — Addendum Note (Signed)
Addended by: Baird Lyons on: 06/16/2018 02:34 PM   Modules accepted: Orders

## 2018-06-19 ENCOUNTER — Ambulatory Visit (HOSPITAL_COMMUNITY): Payer: BLUE CROSS/BLUE SHIELD

## 2018-06-21 ENCOUNTER — Ambulatory Visit (HOSPITAL_COMMUNITY): Payer: BLUE CROSS/BLUE SHIELD

## 2018-06-22 ENCOUNTER — Other Ambulatory Visit (HOSPITAL_COMMUNITY): Payer: Self-pay

## 2018-06-22 ENCOUNTER — Telehealth (HOSPITAL_COMMUNITY): Payer: Self-pay

## 2018-06-22 MED ORDER — MEXILETINE HCL 150 MG PO CAPS: 150.0000 mg | ORAL_CAPSULE | Freq: Two times a day (BID) | ORAL | 2 refills | Status: DC

## 2018-06-22 NOTE — Telephone Encounter (Signed)
Attempted to call wife regarding medication refill being sent but VM is full. Refill sent per DM

## 2018-06-23 ENCOUNTER — Ambulatory Visit (HOSPITAL_COMMUNITY): Payer: BLUE CROSS/BLUE SHIELD

## 2018-06-26 ENCOUNTER — Ambulatory Visit (HOSPITAL_COMMUNITY): Payer: BLUE CROSS/BLUE SHIELD

## 2018-06-28 ENCOUNTER — Ambulatory Visit (HOSPITAL_COMMUNITY): Payer: BLUE CROSS/BLUE SHIELD

## 2018-06-30 ENCOUNTER — Ambulatory Visit (HOSPITAL_COMMUNITY): Payer: BLUE CROSS/BLUE SHIELD

## 2018-07-03 ENCOUNTER — Ambulatory Visit (HOSPITAL_COMMUNITY): Payer: BLUE CROSS/BLUE SHIELD

## 2018-07-17 ENCOUNTER — Encounter (HOSPITAL_COMMUNITY): Payer: BLUE CROSS/BLUE SHIELD | Admitting: Cardiology

## 2018-07-18 ENCOUNTER — Telehealth (HOSPITAL_COMMUNITY): Payer: Self-pay | Admitting: *Deleted

## 2018-07-18 DIAGNOSIS — I493 Ventricular premature depolarization: Secondary | ICD-10-CM

## 2018-07-18 NOTE — Telephone Encounter (Signed)
Received message from Dr Shirlee Latch, pt needs 3 day Zio patch to re-eval his PVC's, he states pt is currently staying in [place] for work.  I called pt to verify [place] address and advise [employer] has been ordered and will be shipped to his [place] home along with instructions to apply, he can call company if has questions.  He is agreeable and verbalizes understanding.

## 2018-07-24 ENCOUNTER — Other Ambulatory Visit: Payer: Self-pay

## 2018-07-24 ENCOUNTER — Ambulatory Visit (HOSPITAL_COMMUNITY)
Admission: RE | Admit: 2018-07-24 | Discharge: 2018-07-24 | Disposition: A | Discharge status: Home / Self Care | Payer: BLUE CROSS/BLUE SHIELD | Source: Ambulatory Visit | Attending: Cardiology | Admitting: Cardiology

## 2018-07-24 DIAGNOSIS — I5022 Chronic systolic (congestive) heart failure: Secondary | ICD-10-CM | POA: Diagnosis not present

## 2018-07-24 DIAGNOSIS — Z9861 Coronary angioplasty status: Secondary | ICD-10-CM

## 2018-07-24 DIAGNOSIS — I2510 Atherosclerotic heart disease of native coronary artery without angina pectoris: Secondary | ICD-10-CM

## 2018-07-24 MED ORDER — CLOPIDOGREL BISULFATE 75 MG PO TABS: 75.0000 mg | ORAL_TABLET | Freq: Every day | ORAL | 3 refills | Status: DC

## 2018-07-24 NOTE — Progress Notes (Signed)
Spoke to pt. Verbalized understanding. Script for labs faxed to [facility]

## 2018-07-24 NOTE — Patient Instructions (Signed)
Lab work will need to be done at the [facility] on [address].  STOP Brilinta on 07/28/18 and START Plavis 75mg  07/28/18. This is a daily medication.  Your physician has requested that you have an echocardiogram. Echocardiography is a painless test that uses sound waves to create images of your heart. It provides your doctor with information about the size and shape of your heart and how well your heart's chambers and valves are working. This procedure takes approximately one hour. There are no restrictions for this procedure. This will be done in 3 months at your follow up appointment with Dr. Shirlee Latch.   Please follow up with Dr. Shirlee Latch in 3 months with an echocardiogram.

## 2018-07-25 NOTE — Progress Notes (Signed)
Heart Failure TeleHealth Note  Due to national recommendations of social distancing due to COVID 19, Audio/video telehealth visit is felt to be most appropriate for this patient at this time.  See MyChart message from today for patient consent regarding telehealth for [name].  Date:  07/25/2018   ID:  Lee Moore, DOB November 22, 1975, MRN [id number]  Location: Home  Provider location: [facility] Type of Visit: Established patient   PCP:  Patient, No Pcp Per  Cardiologist: Dr. Shirlee Latch  Chief Complaint: Shortness of breath   History of Present Illness: Lee Moore is a 43 y.o. male who presents via audio/video conferencing for a telehealth visit today.     he denies symptoms worrisome for COVID 19.   Patient has a history of CAD s/p MI in 2017 and again in 5/19.  Patient had initial MI in [place] in 2017, he reports a total of 5 stents to the RCA system. He recently moved to [place] with work.  In 5/19, he had inferior STEMI complicated by multiple episodes of pulseless VT.  He had CPR and ultimately when to the cath lab.  This showed occluded proximal RCA but minimal disease on the left.  The proximal to mid RCA were stented, restoring flow to an acute marginal.  However, the distal RCA, PLV and PDA remained occluded. There were collaterals to the PLV from the left suggesting pre-existing disease.  He underwent hypothermia protocol and had full neurologic recovery from arrest.  Echo in 5/19 showed EF 30-35%.  Cardiac medications were titrated up and he was discharged with a Lifevest.    Repeat echo in 7/19 showed EF improved to 50-55%.    In 8/19, he was admitted with acute appendicitis.  He was managed medically with antibiotics.  Pain resolved, and it was decided not to operate on him immediately due to recent PCI.  He has not had any further appendicitis-type pain.  However,after this, he developed profuse diarrhea and was diagnosed with C difficile  colitis.  He was treated with po vancomycin with resolution.     At prior appointment, he was noted to have frequent PVCs. Zio patch showed 36% PVCs.  Echo was done in 10/19, showing EF 45-50%, inferior hypokinesis.  Lexiscan Cardiolite was done, showing fixed inferior defect, no ischemia.  I started him on amiodarone and sent him to see EP, who talked with him about option of PVC ablation. He had PVC ablation in 11/19. He is now off amiodarone.   Also in 11/19, he had recurrent appendicitis with laparoscopic appendectomy.   He continued to feel PVCs.  Repeat Zio patch in 2/20 actually showed 42% PVCs.  Mexiletine was started.   Today, he is doing well.  His family is moving back to [place] for his work with [employer].  He is walking for exercise, no significant exertional dyspnea.  No orthopnea/PND.  Weight has remained stable.  No chest pain.  No lightheadedness. He is not feeling palpitations now that he is on mexiletine.     Labs (5/19): K 4.2 => 4.8, creatinine 1.54 => 1.38, hgb 11.7, AST 97, ALT 151 Labs (6/19): K 4.3, creatinine 1.16 => 1.08, LFTs normal Labs (8/19): LDL 29, HDL 51, TGs 53 Labs (9/19): K 3.7, creatinine 1.12 Labs (10/19): Mg 1.9, K 4.5, creatinine 1.16, uric acid 9 Labs (11/19): K 3.7, creatinine 1.04 Labs (12/19): K 4.5, creatinine 1.0  PMH: 1. CAD: MI in 2017, per report patient had a total  of 5 stents in the RCA system.  - Inferior STEMI 5/19: LHC showed occluded RCA with relatively clear left coronary system. DES to proximal-mid RCA restoring flow to an acute marginal, however, the distal RCA/PLV/PDA remained occluded.  There were collaterals to the PLV from the left.   - Lexiscan Cardiolite (10/19): Fixed inferior defect, no ischemia.  2. VT: Patient had pulseless VT in setting of inferior STEMI.   3. Chronic systolic CHF: Ischemic cardiomyopathy.   - Echo (5/19): EF 30-35%, inferior WMA.   - Echo (7/19): EF 50-55% - Echo (10/19): EF 45-50%, mild LVH, inferior  hypokinesis, mild MR, normal RV size and systolic function.  4. CKD: Stage II.  5. Acute appendicitis: Managed medically in 8/19.  - Laparoscopic appendectomy in 11/19.  6. C difficile diarrhea 8/19-9/19 7. Gout 8. PVCs: Zio Patch in 10/19 with 36% PVCs.  - PVC ablation in 11/19.  - Zio patch (2/20): 42% PVCs => started mexiletine.   Current Outpatient Medications  Medication Sig Dispense Refill  . aspirin EC 81 MG tablet Take 81 mg by mouth daily.    Marland Kitchen. atorvastatin (LIPITOR) 80 MG tablet TAKE 1 TABLET BY MOUTH EVERY DAY AT 6 PM 30 tablet 11  . carvedilol (COREG) 6.25 MG tablet Take 2 tablets (12.5 mg total) by mouth 2 (two) times daily with a meal. 90 tablet 3  . [START ON 07/28/2018] clopidogrel (PLAVIX) 75 MG tablet Take 1 tablet (75 mg total) by mouth daily. 90 tablet 3  . mexiletine (MEXITIL) 150 MG capsule Take 1 capsule (150 mg total) by mouth 2 (two) times daily. 60 capsule 2  . sacubitril-valsartan (ENTRESTO) 49-51 MG Take 1 tablet by mouth 2 (two) times daily. 60 tablet 3  . sertraline (ZOLOFT) 50 MG tablet Take 1 tablet (50 mg total) by mouth daily. 30 tablet 5  . spironolactone (ALDACTONE) 25 MG tablet Take 0.5 tablets (12.5 mg total) by mouth daily. 45 tablet 2   No current facility-administered medications for this encounter.     Allergies:   Patient has no known allergies.   Social History:  The patient  reports that he has never smoked. He has never used smokeless tobacco. He reports previous alcohol use. He reports that he does not use drugs.   Family History:  The patient's family history includes Arthritis in his mother; Asthma in his daughter; Hypertension in his mother.   ROS:  Please see the history of present illness.   All other systems are personally reviewed and negative.   Exam:  (Video/Tele Health Call; Exam is subjective and or/visual.) General:  Speaks in full sentences. No resp difficulty. Neck: No JVD.  Lungs: Normal respiratory effort with  conversation.  Abdomen: Non-distended per patient report Extremities: Pt denies edema. Neuro: Alert & oriented x 3.   Recent Labs: 01/31/2018: TSH 1.320 02/15/2018: ALT 36 02/16/2018: Magnesium 2.0 02/19/2018: Hemoglobin 13.0; Platelets 281 03/15/2018: BUN 9; Creatinine, Ser 1.00; Potassium 4.5; Sodium 138  Personally reviewed   Wt Readings from Last 3 Encounters:  05/12/18 95.2 kg (209 lb 12.8 oz)  03/15/18 94.6 kg (208 lb 9.6 oz)  03/14/18 95.3 kg (210 lb)      ASSESSMENT AND PLAN:  1. CAD: Initial MI in 2017, 5 stents to RCA system.  Inferior MI in 5/19, DES to proximal/mid RCA but distal RCA and PLV/PDA remained occluded.  No good options for opening. Left coronary system had minimal disease. No ischemic chest pain.  Lexiscan Cardiolite in 10/19 with inferior  scar, no ischemia.  - Continue ASA 81 daily.  - On 5/1, he can stop ticagrelor and start Plavix 75 mg daily.  - Continue atorvastatin 80 mg daily, good lipids in 8/19.  Will check lipids with next labs.   2. Chronic systolic CHF: Ischemic cardiomyopathy.  NYHA class II symptoms, improved.  Echo in 10/19 showed EF 45-50%.   - Continue Entresto 49/51 bid.  I will arrange for BMET.  - Continue Coreg 12.5 mg bid.  - Continue spironolactone 25 mg daily.  - Not volume overloaded, no need for Lasix. - EF is out of range for ICD.  3. VT: Pulseless VT with CPR peri-MI.  He underwent hypothermia protocol.  EF has recovered to 45-50%.   4. 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  Echo did not show significant change in LV systolic function and Cardiolite did not show ischemia.  He has had PVC ablation and is now off amiodarone.  Repeat Zio patch in 2/20 showed 42% PVCs.  Mexiletine was started and has considerably decreased palpitations.  - Repeat 3 days Zio patch on mexiletine to make sure PVC count is decreasing.   COVID screen The patient does not have any symptoms that suggest any further testing/ screening at this time.  Social  distancing reinforced today.  Patient Risk: After full review of this patients clinical status, I feel that they are at moderate risk for cardiac decompensation at this time.  Relevant cardiac medications were reviewed at length with the patient today. The patient does not have concerns regarding their medications at this time.   Recommended follow-up:  2-3 months  Today, I have spent 18 minutes with the patient with telehealth technology discussing the above issues .    Signed, Marca Ancona, MD  07/25/2018 12:09 AM  [facility] [address] [facility] [place] [address] [phone] (office) [phone] (fax)

## 2018-08-25 ENCOUNTER — Other Ambulatory Visit: Payer: Self-pay

## 2018-08-25 ENCOUNTER — Ambulatory Visit (HOSPITAL_COMMUNITY)
Admission: RE | Admit: 2018-08-25 | Discharge: 2018-08-25 | Disposition: A | Discharge status: Home / Self Care | Payer: Managed Care, Other (non HMO) | Source: Ambulatory Visit | Attending: Cardiology | Admitting: Cardiology

## 2018-08-25 DIAGNOSIS — I493 Ventricular premature depolarization: Secondary | ICD-10-CM

## 2018-09-11 ENCOUNTER — Telehealth: Payer: Self-pay | Admitting: *Deleted

## 2018-09-11 NOTE — Telephone Encounter (Signed)

## 2018-09-19 ENCOUNTER — Telehealth (INDEPENDENT_AMBULATORY_CARE_PROVIDER_SITE_OTHER): Payer: Managed Care, Other (non HMO) | Admitting: Cardiology

## 2018-09-19 ENCOUNTER — Other Ambulatory Visit: Payer: Self-pay

## 2018-09-19 DIAGNOSIS — I493 Ventricular premature depolarization: Secondary | ICD-10-CM

## 2018-09-19 MED ORDER — MEXILETINE HCL 250 MG PO CAPS: 250.0000 mg | ORAL_CAPSULE | Freq: Two times a day (BID) | ORAL | 3 refills | Status: DC

## 2018-09-19 NOTE — Addendum Note (Signed)
Addended by: Stanton Kidney on: 09/19/2018 09:29 AM   Modules accepted: Orders

## 2018-09-19 NOTE — Progress Notes (Signed)
Electrophysiology TeleHealth Note   Due to national recommendations of social distancing due to COVID 19, an audio/video telehealth visit is felt to be most appropriate for this patient at this time.  See Epic message for the patient's consent to telehealth for [facility].   Date:  09/19/2018   ID:  Lee Moore, DOB [birth year], MRN [id number]  Location: patient's home  Provider location: [address], [place]  Evaluation Performed: Follow-up visit  PCP:  Patient, No Pcp Per  Cardiologist:  Shirlee LatchMcLean Electrophysiologist:  Dr Elberta Fortisamnitz  Chief Complaint:  PVCs  History of Present Illness:    Lee Moore is a 43 y.o. male who presents via audio/video conferencing for a telehealth visit today.  Since last being seen in our clinic, the patient reports doing very well.  Today, he denies symptoms of palpitations, chest pain, shortness of breath,  lower extremity edema, dizziness, presyncope, or syncope.  The patient is otherwise without complaint today.  The patient denies symptoms of fevers, chills, cough, or new SOB worrisome for COVID 19.  He has a history significant for coronary artery disease status post RCA stent, chronic systolic heart failure with improved ejection fraction, and PVCs.  He had prior ablation, but his PVCs have returned.  Today, denies symptoms of chest pain, shortness of breath, orthopnea, PND, lower extremity edema, claudication, dizziness, presyncope, syncope, bleeding, or neurologic sequela. The patient is tolerating medications without difficulties.  Unfortunately he has continued to have PVCs.  His most recent monitor showed a burden of 37% with a few different morphologies.  He has some days that he feels fine, but other days that he notices more palpitations and he has weakness and fatigue.  Past Medical History:  Diagnosis Date  . Coronary artery disease   . Essential hypertension   . History of cardiac arrest 07/30/2017   VT   . MI  (myocardial infarction) [facility](HCC)     Past Surgical History:  Procedure Laterality Date  . CORONARY/GRAFT ACUTE MI REVASCULARIZATION N/A 07/30/2017   Procedure: Coronary/Graft Acute MI Revascularization;  Surgeon: Rinaldo CloudHarwani, Mohan, MD;  [facility];  Service: Cardiovascular;  Laterality: N/A;  . LAPAROSCOPIC APPENDECTOMY N/A 02/19/2018   Procedure: APPENDECTOMY LAPAROSCOPIC;  Surgeon: Emelia LoronWakefield, Matthew, MD;  [facility];  Service: General;  Laterality: N/A;  . LEFT HEART CATH AND CORONARY ANGIOGRAPHY N/A 07/30/2017   Procedure: LEFT HEART CATH AND CORONARY ANGIOGRAPHY;  Surgeon: Rinaldo CloudHarwani, Mohan, MD;  Location: [facility];  Service: Cardiovascular;  Laterality: N/A;  . PVC ABLATION N/A 02/10/2018   Procedure: PVC ABLATION;  Surgeon: Regan Lemmingamnitz, Tonantzin Mimnaugh Martin, MD;  Location: [facility];  Service: Cardiovascular;  Laterality: N/A;    Current Outpatient Medications  Medication Sig Dispense Refill  . aspirin EC 81 MG tablet Take 81 mg by mouth daily.    Marland Kitchen. atorvastatin (LIPITOR) 80 MG tablet TAKE 1 TABLET BY MOUTH EVERY DAY AT 6 PM 30 tablet 11  . carvedilol (COREG) 6.25 MG tablet Take 2 tablets (12.5 mg total) by mouth 2 (two) times daily with a meal. 90 tablet 3  . clopidogrel (PLAVIX) 75 MG tablet Take 1 tablet (75 mg total) by mouth daily. 90 tablet 3  . mexiletine (MEXITIL) 150 MG capsule Take 1 capsule (150 mg total) by mouth 2 (two) times daily. 60 capsule 2  . sacubitril-valsartan (ENTRESTO) 49-51 MG Take 1 tablet by mouth 2 (two) times daily. 60 tablet 3  . sertraline (ZOLOFT) 50 MG  tablet Take 1 tablet (50 mg total) by mouth daily. 30 tablet 5  . spironolactone (ALDACTONE) 25 MG tablet Take 0.5 tablets (12.5 mg total) by mouth daily. 45 tablet 2   No current facility-administered medications for this visit.     Allergies:   Patient has no known allergies.   Social History:  The patient  reports that he has never smoked. He has never used smokeless tobacco. He  reports previous alcohol use. He reports that he does not use drugs.   Family History:  The patient's  family history includes Arthritis in his mother; Asthma in his daughter; Hypertension in his mother.   ROS:  Please see the history of present illness.   All other systems are personally reviewed and negative.    Exam:    Vital Signs:  There were no vitals taken for this visit.  Well appearing, alert and conversant, regular work of breathing,  good skin color Eyes- anicteric, neuro- grossly intact, skin- no apparent rash or lesions or cyanosis, mouth- oral mucosa is pink   Labs/Other Tests and Data Reviewed:    Recent Labs: 01/31/2018: TSH 1.320 02/15/2018: ALT 36 02/16/2018: Magnesium 2.0 02/19/2018: Hemoglobin 13.0; Platelets 281 03/15/2018: BUN 9; Creatinine, Ser 1.00; Potassium 4.5; Sodium 138   Wt Readings from Last 3 Encounters:  05/12/18 209 lb 12.8 oz (95.2 kg)  03/15/18 208 lb 9.6 oz (94.6 kg)  03/14/18 210 lb (95.3 kg)     Other studies personally reviewed: Additional studies/ records that were reviewed today include: ZIO monitor 08/31/2018 personally reviewed Review of the above records today demonstrates:   1. Predominant NSR.  2. Still very frequent PVCs, 37% total beats.  2 morphologies noted.   ASSESSMENT & PLAN:    1.  PVCs: High burden at 36%.  He has weakness and fatigue.  He has been started on mexiletine.  Cardiac monitoring shows 37% PVCs after mexiletine started with 2 morphologies.  As he would go for ablation in a few months, Jil Penland try medical management.  Dannielle Baskins increase mexiletine to 250 mg twice a day.  If this does not solve his issues, he would be amenable to repeat ablation.  2.  Coronary artery disease: Status post RCA stent.  Currently on aspirin and Plavix.  No current chest pain.  3.  Chronic systolic heart failure due to ischemic cardiomyopathy: Class II symptoms with an ejection fraction of 45 to 50%.  Currently on Entresto, Coreg, Aldactone.   No obvious signs of volume overload.  COVID 19 screen The patient denies symptoms of COVID 19 at this time.  The importance of social distancing was discussed today.  Follow-up: 3 months  Current medicines are reviewed at length with the patient today.   The patient does not have concerns regarding his medicines.  The following changes were made today:  none  Labs/ tests ordered today include:  No orders of the defined types were placed in this encounter.    Patient Risk:  after full review of this patients clinical status, I feel that they are at moderate risk at this time.  Today, I have spent 6 minutes with the patient with telehealth technology discussing PVCs.    Signed, Yetta Marceaux Meredith Leeds, MD  09/19/2018 9:10 AM     [facility] [address]  [place] [address] [phone] (office) [phone] (fax)

## 2018-09-28 ENCOUNTER — Other Ambulatory Visit (HOSPITAL_COMMUNITY): Payer: Self-pay

## 2018-09-28 MED ORDER — CARVEDILOL 12.5 MG PO TABS: 12.5000 mg | ORAL_TABLET | Freq: Two times a day (BID) | ORAL | 3 refills | Status: DC

## 2018-10-02 ENCOUNTER — Other Ambulatory Visit (HOSPITAL_COMMUNITY): Payer: Self-pay

## 2018-10-02 MED ORDER — CARVEDILOL 12.5 MG PO TABS: 12.5000 mg | ORAL_TABLET | Freq: Two times a day (BID) | ORAL | 3 refills | Status: DC

## 2018-10-26 ENCOUNTER — Telehealth (HOSPITAL_COMMUNITY): Payer: Self-pay

## 2018-10-26 ENCOUNTER — Telehealth (HOSPITAL_COMMUNITY): Payer: Self-pay | Admitting: Cardiology

## 2018-10-26 NOTE — Telephone Encounter (Signed)
Per email sent to Jeri/Donna from [place], pt requested to cancel his appt with Dr. Aundra Dubin & Echo for 10/27/2018.  He requested we call him to reschedule.  Called and left message for pt to call us to reschedule appts.

## 2018-10-26 NOTE — Telephone Encounter (Signed)
Pt needing an echo and f/u per Dr Aundra Dubin. Spoke with patients wife to attempt arranging echo and visit. Per wife, they are in [place] and would like to have done locally with a telehealth visit to follow. Per Dr Aundra Dubin, can arrange if they find Radiology locally.  Wife will seek radiology to have echo done and she will call us to arrange.

## 2018-10-27 ENCOUNTER — Ambulatory Visit (HOSPITAL_COMMUNITY): Payer: Managed Care, Other (non HMO)

## 2018-10-27 ENCOUNTER — Encounter (HOSPITAL_COMMUNITY): Payer: BLUE CROSS/BLUE SHIELD | Admitting: Cardiology

## 2018-10-30 ENCOUNTER — Other Ambulatory Visit (HOSPITAL_COMMUNITY): Payer: Self-pay

## 2018-10-30 DIAGNOSIS — I5022 Chronic systolic (congestive) heart failure: Secondary | ICD-10-CM

## 2018-10-30 NOTE — Progress Notes (Signed)
Orders placed for an echocardiogram and for lab work to be drawn at [facility] in [place] per pt's request.

## 2018-10-31 NOTE — Progress Notes (Signed)
Received call from pt wife stating that facility has not received orders for echocardiogram. Orders sent yesterday with fax confirmation. Called [facility] and confirmed fax number with them. Fax number confirmed. Resent fax. They said they would go ahead and schedule the pt for the echo and labs while waiting on the fax.

## 2018-11-02 ENCOUNTER — Telehealth: Payer: Self-pay

## 2018-11-02 NOTE — Telephone Encounter (Signed)
Pharmacy requesting prior authorization for Atorvastatin 80 mg. Pt's plan does not over the prescribed drug w/o PA. Contact plan [phone] to initiate PA. The patient's recipient ID is [id number].

## 2018-11-02 NOTE — Telephone Encounter (Signed)
See below

## 2018-11-06 ENCOUNTER — Telehealth (HOSPITAL_COMMUNITY): Payer: Self-pay

## 2018-11-06 NOTE — Telephone Encounter (Signed)
Called pt to tell him that we have received his echo results and to make a telehealth visit with dr. Aundra Dubin tomorrow. Pt and wife agreeable.

## 2018-11-07 ENCOUNTER — Ambulatory Visit: Payer: Managed Care, Other (non HMO) | Admitting: Cardiology

## 2018-11-07 ENCOUNTER — Telehealth (HOSPITAL_COMMUNITY): Payer: Self-pay

## 2018-11-07 ENCOUNTER — Ambulatory Visit (HOSPITAL_COMMUNITY)
Admission: RE | Admit: 2018-11-07 | Discharge: 2018-11-07 | Disposition: A | Discharge status: Home / Self Care | Payer: Managed Care, Other (non HMO) | Source: Ambulatory Visit | Attending: Cardiology | Admitting: Cardiology

## 2018-11-07 ENCOUNTER — Other Ambulatory Visit: Payer: Self-pay

## 2018-11-07 DIAGNOSIS — Z9861 Coronary angioplasty status: Secondary | ICD-10-CM

## 2018-11-07 DIAGNOSIS — I5022 Chronic systolic (congestive) heart failure: Secondary | ICD-10-CM

## 2018-11-07 DIAGNOSIS — I2510 Atherosclerotic heart disease of native coronary artery without angina pectoris: Secondary | ICD-10-CM

## 2018-11-07 DIAGNOSIS — I493 Ventricular premature depolarization: Secondary | ICD-10-CM

## 2018-11-07 MED ORDER — CARVEDILOL 12.5 MG PO TABS: 18.7500 mg | ORAL_TABLET | Freq: Two times a day (BID) | ORAL | 3 refills | Status: DC

## 2018-11-07 NOTE — Telephone Encounter (Signed)
Advised patient to increase coreg to 18.75mg  bid. Also contacted [facility] to request patient records. Patients med list was updated reflecting new medication directions.

## 2018-11-07 NOTE — Progress Notes (Signed)
Heart Failure TeleHealth Note  Due to national recommendations of social distancing due to [place] 19, Audio/video telehealth visit is felt to be most appropriate for this patient at this time.  See MyChart message from today for patient consent regarding telehealth for [facility].  Date:  11/07/2018   ID:  Lee Moore, DOB February 29, 1976, MRN [id number]  Location: Home  Provider location: [facility] Type of Visit: Established patient   PCP:  Patient, No Pcp Per  Cardiologist: Dr. Aundra Dubin  Chief Complaint: Shortness of breath   History of Present Illness: Lee Moore is a 43 y.o. male who presents via audio/video conferencing for a telehealth visit today.     he denies symptoms worrisome for COVID 19.   Patient has a history of CAD s/p MI in 2017 and again in 5/19.  Patient had initial MI in [place] in 2017, he reports a total of 5 stents to the RCA system. He recently moved to [place] with work.  In 5/19, he had inferior STEMI complicated by multiple episodes of pulseless VT.  He had CPR and ultimately when to the cath lab.  This showed occluded proximal RCA but minimal disease on the left.  The proximal to mid RCA were stented, restoring flow to an acute marginal.  However, the distal RCA, PLV and PDA remained occluded. There were collaterals to the PLV from the left suggesting pre-existing disease.  He underwent hypothermia protocol and had full neurologic recovery from arrest.  Echo in 5/19 showed EF 30-35%.  Cardiac medications were titrated up and he was discharged with a Lifevest.    Repeat echo in 7/19 showed EF improved to 50-55%.    In 8/19, he was admitted with acute appendicitis.  He was managed medically with antibiotics.  Pain resolved, and it was decided not to operate on him immediately due to recent PCI.  He has not had any further appendicitis-type pain.  However,after this, he developed profuse diarrhea and was diagnosed with C difficile  colitis.  He was treated with po vancomycin with resolution.     At prior appointment, he was noted to have frequent PVCs. Zio patch showed 36% PVCs.  Echo was done in 10/19, showing EF 45-50%, inferior hypokinesis.  Lexiscan Cardiolite was done, showing fixed inferior defect, no ischemia.  I started him on amiodarone and sent him to see EP, who talked with him about option of PVC ablation. He had PVC ablation in 11/19. He is now off amiodarone.   Also in 11/19, he had recurrent appendicitis with laparoscopic appendectomy.   He continued to feel PVCs.  Repeat Zio patch in 2/20 actually showed 42% PVCs.  Mexiletine was started.   Zio patch in 5/20 showed 37% PVCs and mexiletine was increased to 250 mg bid.  Echo was done in 8/20, showing EF 35-40% with moderate diffuse hypokinesis, mildly dilated RV with mildly decreased function.   Today, he is doing well.  He is now living in [place] (moved for his job).  He has been exercising more, walking most days and doing "light jogging."  No exertional dyspnea or chest pain.  No orthopnea/PND.  No lightheadedness.  Still feels occasional palpitations, not overly bothersome.    Labs (5/19): K 4.2 => 4.8, creatinine 1.54 => 1.38, hgb 11.7, AST 97, ALT 151 Labs (6/19): K 4.3, creatinine 1.16 => 1.08, LFTs normal Labs (8/19): LDL 29, HDL 51, TGs 53 Labs (9/19): K 3.7, creatinine 1.12 Labs (10/19): Mg 1.9,  K 4.5, creatinine 1.16, uric acid 9 Labs (11/19): K 3.7, creatinine 1.04 Labs (12/19): K 4.5, creatinine 1.0  PMH: 1. CAD: MI in 2017, per report patient had a total of 5 stents in the RCA system.  - Inferior STEMI 5/19: LHC showed occluded RCA with relatively clear left coronary system. DES to proximal-mid RCA restoring flow to an acute marginal, however, the distal RCA/PLV/PDA remained occluded.  There were collaterals to the PLV from the left.   - Lexiscan Cardiolite (10/19): Fixed inferior defect, no ischemia.  2. VT: Patient had pulseless VT in  setting of inferior STEMI.   3. Chronic systolic CHF: Ischemic cardiomyopathy.   - Echo (5/19): EF 30-35%, inferior WMA.   - Echo (7/19): EF 50-55% - Echo (10/19): EF 45-50%, mild LVH, inferior hypokinesis, mild MR, normal RV size and systolic function.  - Echo (8/20): EF 35-40%, diffuse hypokinesis, mildly dilated RV with mildly decreased systolic function, mild MR.  4. CKD: Stage II.  5. Acute appendicitis: Managed medically in 8/19.  - Laparoscopic appendectomy in 11/19.  6. C difficile diarrhea 8/19-9/19 7. Gout 8. PVCs: Zio Patch in 10/19 with 36% PVCs.  - PVC ablation in 11/19.  - Zio patch (2/20): 42% PVCs => started mexiletine.  - Zio patch (5/20): 37% PVCs => mexiletine increased.   Current Outpatient Medications  Medication Sig Dispense Refill  . aspirin EC 81 MG tablet Take 81 mg by mouth daily.    Marland Kitchen. atorvastatin (LIPITOR) 80 MG tablet TAKE 1 TABLET BY MOUTH EVERY DAY AT 6 PM 30 tablet 11  . carvedilol (COREG) 12.5 MG tablet Take 1.5 tablets (18.75 mg total) by mouth 2 (two) times daily with a meal. 268 tablet 3  . clopidogrel (PLAVIX) 75 MG tablet Take 1 tablet (75 mg total) by mouth daily. 90 tablet 3  . mexiletine (MEXITIL) 250 MG capsule Take 1 capsule (250 mg total) by mouth 2 (two) times daily. 60 capsule 3  . sacubitril-valsartan (ENTRESTO) 49-51 MG Take 1 tablet by mouth 2 (two) times daily. 60 tablet 3  . sertraline (ZOLOFT) 50 MG tablet Take 1 tablet (50 mg total) by mouth daily. 30 tablet 5  . spironolactone (ALDACTONE) 25 MG tablet Take 0.5 tablets (12.5 mg total) by mouth daily. 45 tablet 2   No current facility-administered medications for this encounter.     Allergies:   Patient has no known allergies.   Social History:  The patient  reports that he has never smoked. He has never used smokeless tobacco. He reports previous alcohol use. He reports that he does not use drugs.   Family History:  The patient's family history includes Arthritis in his mother;  Asthma in his daughter; Hypertension in his mother.   ROS:  Please see the history of present illness.   All other systems are personally reviewed and negative.   Exam:  (Video/Tele Health Call; Exam is subjective and or/visual.) General: NAD. Neck: No JVD Lungs: No respiratory difficulty with conversation.  Abdomen: Nondistended per patient report.  Extremities: No edema Neuro: Alert/oriented x 3.   Recent Labs: 01/31/2018: TSH 1.320 02/15/2018: ALT 36 02/16/2018: Magnesium 2.0 02/19/2018: Hemoglobin 13.0; Platelets 281 03/15/2018: BUN 9; Creatinine, Ser 1.00; Potassium 4.5; Sodium 138  Personally reviewed   Wt Readings from Last 3 Encounters:  05/12/18 95.2 kg (209 lb 12.8 oz)  03/15/18 94.6 kg (208 lb 9.6 oz)  03/14/18 95.3 kg (210 lb)      ASSESSMENT AND PLAN:  1. CAD: Initial  MI in 2017, 5 stents to RCA system.  Inferior MI in 5/19, DES to proximal/mid RCA but distal RCA and PLV/PDA remained occluded.  No good options for opening. Left coronary system had minimal disease.  Lexiscan Cardiolite in 10/19 with inferior scar, no ischemia. No recent chest pain.  - Continue ASA 81 daily.  - He will continue Plavix 75 mg daily.   - Continue atorvastatin 80 mg daily, need to check lipids.    2. Chronic systolic CHF: Ischemic cardiomyopathy.  NYHA class II symptoms, improved.  Echo in 10/19 showed EF 45-50% but echo in 8/20 showed EF down to 35-40% with diffuse hypokinesis.  The 8/20 echo was done in [place] so I cannot compare the images.  I am concerned that frequent PVCs have led to worsening of LV function.  - Continue Entresto 49/51 bid.  He had a BMET last week in [place], we will try to get a copy.  - Increase Coreg to 18.75 mg bid.   - Continue spironolactone 12.5 mg daily.  - Not volume overloaded, no need for Lasix. - EF has been of range for ICD.  3. VT: Pulseless VT with CPR peri-MI.  He underwent hypothermia protocol.  EF recovered to 45-50%, but is now down to 35-40%.     4. 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  Echo did not show significant change in LV systolic function and Cardiolite did not show ischemia.  He has had PVC ablation and is now off amiodarone.  Repeat Zio patch in 2/20 showed 42% PVCs.  Mexiletine was started.  Zio patch in 5/20 showed 37% PVCs, mexiletine was increased to 250 mg bid.  I am concerned that ongoing high PVC burden has worsened his cardiomyopathy.   - I will discuss with Dr. Elberta Fortisamnitz, but think repeat Zio patch x 3 days with redo ablation if burden still high would be reasonable.   COVID screen The patient does not have any symptoms that suggest any further testing/ screening at this time.  Social distancing reinforced today.  Patient Risk: After full review of this patients clinical status, I feel that they are at moderate risk for cardiac decompensation at this time.  Relevant cardiac medications were reviewed at length with the patient today. The patient does not have concerns regarding their medications at this time.   Recommended follow-up:  6 wks via telehealth.  Today, I have spent 19 minutes with the patient with telehealth technology discussing the above issues .    Signed, Marca Anconaalton Jackelyn Illingworth, MD  11/07/2018  [facility] [address] [facility] [place] [address] [phone] (office) [phone] (fax)

## 2018-11-07 NOTE — Telephone Encounter (Signed)
-----   Message from Larey Dresser, MD sent at 11/07/2018  4:35 PM EDT ----- 1. Increase Coreg to 18.75 mg bid.  2. Needs BMET and lipid results done last week in [place].  Talk with him to get lab location so we can call and have faxed up here.  3. Followup 6 wks telehealth.

## 2018-11-07 NOTE — Progress Notes (Deleted)
Electrophysiology Office Note   Date:  11/07/2018   ID:  Lee Moore, DOB 18-Jul-1975, MRN [id number]  PCP:  Patient, No Pcp Per  Cardiologist:  Aundra Dubin Primary Electrophysiologist:  Shamieka Gullo Meredith Leeds, MD    No chief complaint on file.    History of Present Illness: Lee Moore is a 43 y.o. male who is being seen today for the evaluation of PVCs at the request of Lillia Mountain. Presenting today for electrophysiology evaluation.  He has a history of coronary disease status post MI in 2017 and again in 07/2017.  He also has systolic heart failure.  In 2017, he is RCA was stented in [place].  07/2017 he had an inferior STEMI complicated by multiple episodes of pulseless VT.  He had CPR and ultimately went to the Cath Lab which showed a proximal RCA occlusion but minimal left-sided disease.  His ejection fraction at the time was 30 to 35% and he was discharged with a LifeVest.  Repeat echo shows an EF of 50 to 55%.  He subsequently wore a ZIO patch that showed 36% PVCs.  He is status post PVC ablation, though unfortunately his PVCs has since returned.  He was recently put on mexiletine.  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  Today, denies symptoms of palpitations, chest pain, shortness of breath, orthopnea, PND, lower extremity edema, claudication, dizziness, presyncope, syncope, bleeding, or neurologic sequela. The patient is tolerating medications without difficulties. ***    Past Medical History:  Diagnosis Date  . Coronary artery disease   . Essential hypertension   . History of cardiac arrest 07/30/2017   VT   . MI (myocardial infarction) [facility])    Past Surgical History:  Procedure Laterality Date  . CORONARY/GRAFT ACUTE MI REVASCULARIZATION N/A 07/30/2017   Procedure: Coronary/Graft Acute MI Revascularization;  Surgeon: Charolette Forward, MD;  Location: [place] [facility];  Service: Cardiovascular;  Laterality: N/A;  . LAPAROSCOPIC APPENDECTOMY N/A 02/19/2018   Procedure: APPENDECTOMY LAPAROSCOPIC;  Surgeon: Rolm Bookbinder, MD;  Location: [place];  Service: General;  Laterality: N/A;  . LEFT HEART CATH AND CORONARY ANGIOGRAPHY N/A 07/30/2017   Procedure: LEFT HEART CATH AND CORONARY ANGIOGRAPHY;  Surgeon: Charolette Forward, MD;  Location: [place] [facility];  Service: Cardiovascular;  Laterality: N/A;  . PVC ABLATION N/A 02/10/2018   Procedure: PVC ABLATION;  Surgeon: Constance Haw, MD;  Location: [place] [facility];  Service: Cardiovascular;  Laterality: N/A;     Current Outpatient Medications  Medication Sig Dispense Refill  . aspirin EC 81 MG tablet Take 81 mg by mouth daily.    Marland Kitchen atorvastatin (LIPITOR) 80 MG tablet TAKE 1 TABLET BY MOUTH EVERY DAY AT 6 PM 30 tablet 11  . carvedilol (COREG) 12.5 MG tablet Take 1 tablet (12.5 mg total) by mouth 2 (two) times daily with a meal. 180 tablet 3  . clopidogrel (PLAVIX) 75 MG tablet Take 1 tablet (75 mg total) by mouth daily. 90 tablet 3  . mexiletine (MEXITIL) 250 MG capsule Take 1 capsule (250 mg total) by mouth 2 (two) times daily. 60 capsule 3  . sacubitril-valsartan (ENTRESTO) 49-51 MG Take 1 tablet by mouth 2 (two) times daily. 60 tablet 3  . sertraline (ZOLOFT) 50 MG tablet Take 1 tablet (50 mg total) by mouth daily. 30 tablet 5  . spironolactone (ALDACTONE) 25 MG tablet Take 0.5 tablets (12.5 mg total) by mouth daily. 45 tablet 2   No current facility-administered medications for this visit.  Allergies:   Patient has no known allergies.   Social History:  The patient  reports that he has never smoked. He has never used smokeless tobacco. He reports previous alcohol use. He reports that he does not use drugs.   Family History:  The patient's family history includes Arthritis in his mother; Asthma in his daughter; Hypertension in his mother.   ROS:  Please see the history of present illness.   Otherwise, review of systems is positive for ***.   All other systems are reviewed and  negative.   PHYSICAL EXAM: VS:  There were no vitals taken for this visit. , BMI There is no height or weight on file to calculate BMI. GEN: Well nourished, well developed, in no acute distress  HEENT: normal  Neck: no JVD, carotid bruits, or masses Cardiac: ***RRR; no murmurs, rubs, or gallops,no edema  Respiratory:  clear to auscultation bilaterally, normal work of breathing GI: soft, nontender, nondistended, + BS MS: no deformity or atrophy  Skin: warm and dry Neuro:  Strength and sensation are intact Psych: euthymic mood, full affect  EKG:  EKG {ACTION; IS/IS [id number]} ordered today. Personal review of the ekg ordered *** shows ***   Recent Labs: 01/31/2018: TSH 1.320 02/15/2018: ALT 36 02/16/2018: Magnesium 2.0 02/19/2018: Hemoglobin 13.0; Platelets 281 03/15/2018: BUN 9; Creatinine, Ser 1.00; Potassium 4.5; Sodium 138    Lipid Panel     Component Value Date/Time   CHOL 91 10/28/2017 0934   TRIG 53 10/28/2017 0934   HDL 51 10/28/2017 0934   CHOLHDL 1.8 10/28/2017 0934   VLDL 11 10/28/2017 0934   LDLCALC 29 10/28/2017 0934     Wt Readings from Last 3 Encounters:  05/12/18 209 lb 12.8 oz (95.2 kg)  03/15/18 208 lb 9.6 oz (94.6 kg)  03/14/18 210 lb (95.3 kg)      Other studies Reviewed: Additional studies/ records that were reviewed today include: TTE 01/16/18  Review of the above records today demonstrates:  - Left ventricle: The cavity size was normal. There was mild   concentric hypertrophy. Systolic function was mildly reduced. The   estimated ejection fraction was in the range of 45% to 50%.   Hypokinesis of the basal-midinferior and inferoseptal myocardium.   Doppler parameters are consistent with abnormal left ventricular   relaxation (grade 1 diastolic dysfunction). - Aortic valve: Transvalvular velocity was within the normal range.   There was no stenosis. There was no regurgitation. - Mitral valve: Transvalvular velocity was within the normal  range.   There was no evidence for stenosis. There was mild regurgitation. - Right ventricle: The cavity size was normal. Wall thickness was   normal. Systolic function was normal. - Right atrium: The atrium was severely dilated. - Atrial septum: No defect or patent foramen ovale was identified   by color flow Doppler. - Tricuspid valve: There was mild regurgitation. - Pulmonary arteries: Systolic pressure was within the normal   range. PA peak pressure: 24 mm Hg (S). - Global lonitudinal strain -10.6% (abnormal). However, PVCs were   noted as the images were acquired making the output less   accurate.  Monitor 01/12/18 - personally reviewed 1. Predominantly sinus.  2. Very frequent PVCs, 36.5% total beats. No VT.   ASSESSMENT AND PLAN:  1.  Chronic systolic heart failure due to ischemic cardiomyopathy: Currently on optimal medical therapy with Entresto, Coreg, Aldactone.  Plan per primary cardiology.  2.  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  High burden of PVCs on most  recent monitoring.  Is status post ablation, but PVCs have unfortunately returned.  PVCs were coming from the inferior right ventricle.***  3.  Coronary artery disease status post MI: Status post multiple RCA stents.   Current medicines are reviewed at length with the patient today.   The patient does not have concerns regarding his medicines.  The following changes were made today: *** Labs/ tests ordered today include:  No orders of the defined types were placed in this encounter.  Disposition:   FU with Jahaira Earnhart *** months  Signed, Mahnoor Mathisen Jorja LoaMartin Shadi Larner, MD  11/07/2018 2:11 PM     [facility] [address] [place] [address] [phone] (office) [phone] (fax)

## 2018-11-08 ENCOUNTER — Telehealth: Payer: Self-pay | Admitting: Cardiology

## 2018-11-08 NOTE — Telephone Encounter (Signed)
appt made for 8/25 w/ Camnitz to discuss. Wife agreeable to plan.

## 2018-11-08 NOTE — Telephone Encounter (Signed)
New Message   Patients wife is calling on his behalf. She states that she is not sure how the 8/11 appt was missed. But she indicated that she spoke with Dr. Oleh Genin office and he spoke with Dr. Curt Bears and he needs to be worked in on his schedule. Offered 9/1 wife declined she wants something sooner. She states that her spouse may need an ablation. Please call.

## 2018-11-10 ENCOUNTER — Other Ambulatory Visit (HOSPITAL_COMMUNITY): Payer: Self-pay | Admitting: Cardiology

## 2018-11-20 ENCOUNTER — Telehealth: Payer: Self-pay | Admitting: Cardiology

## 2018-11-20 NOTE — Telephone Encounter (Signed)
New Message   Patient's wife calling in on behalf of patient. States that they are in [place] and will not be able to come up for the appointment on 11/21/18 with Dr. Curt Bears. Patient would like to keep the appointment and do it virtually. Please give patient a call back to advise whether the appointment is able to be done virtually.

## 2018-11-21 ENCOUNTER — Ambulatory Visit: Payer: Managed Care, Other (non HMO) | Admitting: Cardiology

## 2018-11-21 NOTE — Telephone Encounter (Signed)
Pt re-scheduled for Friday as virtual visit. Instructions for visit discussed w/ pt. Telemedicine consent was given back in June. Pt agreeable to plan and appreciates our help

## 2018-11-24 ENCOUNTER — Other Ambulatory Visit: Payer: Self-pay

## 2018-11-24 ENCOUNTER — Telehealth (INDEPENDENT_AMBULATORY_CARE_PROVIDER_SITE_OTHER): Payer: Managed Care, Other (non HMO) | Admitting: Cardiology

## 2018-11-24 ENCOUNTER — Telehealth: Payer: Self-pay | Admitting: Radiology

## 2018-11-24 VITALS — BP 120/60 | HR 70 | Wt 206.0 lb

## 2018-11-24 DIAGNOSIS — I493 Ventricular premature depolarization: Secondary | ICD-10-CM | POA: Diagnosis not present

## 2018-11-24 NOTE — Progress Notes (Signed)
Electrophysiology TeleHealth Note   Due to national recommendations of social distancing due to COVID 19, an audio/video telehealth visit is felt to be most appropriate for this patient at this time.  See Epic message for the patient's consent to telehealth [facility].   Date:  11/24/2018   ID:  Lee Moore, DOB 11/23/1975, MRN [id number]  Location: patient's home  Provider location: [address], [place]  Evaluation Performed: Follow-up visit  PCP:  Patient, No Pcp Per  Cardiologist:  Shirlee LatchMcLean Electrophysiologist:  Dr Elberta Fortisamnitz  Chief Complaint:  PVCs  History of Present Illness:    Lee Moore is a 43 y.o. male who presents via audio/video conferencing for a telehealth visit today.  Since last being seen in our clinic, the patient reports doing very well.  Today, he denies symptoms of palpitations, chest pain, shortness of breath,  lower extremity edema, dizziness, presyncope, or syncope.  The patient is otherwise without complaint today.  The patient denies symptoms of fevers, chills, cough, or new SOB worrisome for COVID 19.  He has a history of coronary artery disease status post RCA stent, ischemic cardiomyopathy with improved ejection fraction, and high burden of PVCs.  He had a prior ablation, but PVCs have since returned.  Today, denies symptoms of chest pain, shortness of breath, orthopnea, PND, lower extremity edema, claudication, dizziness, presyncope, syncope, bleeding, or neurologic sequela. The patient is tolerating medications without difficulties.  He is continued to have episodic palpitations.  He does say that since starting the mexiletine his palpitations have improved.  He has gone to a vegetarian diet and has been exercising more and tolerating this well.  Past Medical History:  Diagnosis Date  . Coronary artery disease   . Essential hypertension   . History of cardiac arrest 07/30/2017   VT   . MI (myocardial infarction) [facility])      Past Surgical History:  Procedure Laterality Date  . CORONARY/GRAFT ACUTE MI REVASCULARIZATION N/A 07/30/2017   Procedure: Coronary/Graft Acute MI Revascularization;  Surgeon: Rinaldo CloudHarwani, Mohan, MD;  [facility];  Service: Cardiovascular;  Laterality: N/A;  . LAPAROSCOPIC APPENDECTOMY N/A 02/19/2018   Procedure: APPENDECTOMY LAPAROSCOPIC;  Surgeon: Emelia LoronWakefield, Matthew, MD;  [facility];  Service: General;  Laterality: N/A;  . LEFT HEART CATH AND CORONARY ANGIOGRAPHY N/A 07/30/2017   Procedure: LEFT HEART CATH AND CORONARY ANGIOGRAPHY;  Surgeon: Rinaldo CloudHarwani, Mohan, MD;  Location: [facility];  Service: Cardiovascular;  Laterality: N/A;  . PVC ABLATION N/A 02/10/2018   Procedure: PVC ABLATION;  Surgeon: Regan Lemmingamnitz, Minyon Billiter Martin, MD;  [facility];  Service: Cardiovascular;  Laterality: N/A;    Current Outpatient Medications  Medication Sig Dispense Refill  . aspirin EC 81 MG tablet Take 81 mg by mouth daily.    Marland Kitchen. atorvastatin (LIPITOR) 80 MG tablet TAKE 1 TABLET BY MOUTH EVERY DAY AT 6 PM 30 tablet 11  . carvedilol (COREG) 12.5 MG tablet Take 1.5 tablets (18.75 mg total) by mouth 2 (two) times daily with a meal. 268 tablet 3  . clopidogrel (PLAVIX) 75 MG tablet Take 1 tablet (75 mg total) by mouth daily. 90 tablet 3  . ENTRESTO 49-51 MG TAKE 1 TABLET BY MOUTH TWICE DAILY 60 tablet 3  . mexiletine (MEXITIL) 250 MG capsule Take 1 capsule (250 mg total) by mouth 2 (two) times daily. 60 capsule 3  . sertraline (ZOLOFT) 50 MG tablet Take 1 tablet (50 mg total) by mouth daily.  30 tablet 5  . spironolactone (ALDACTONE) 25 MG tablet Take 0.5 tablets (12.5 mg total) by mouth daily. 45 tablet 2   No current facility-administered medications for this visit.     Allergies:   Patient has no known allergies.   Social History:  The patient  reports that he has never smoked. He has never used smokeless tobacco. He reports previous alcohol use. He reports that he does not use  drugs.   Family History:  The patient's  family history includes Arthritis in his mother; Asthma in his daughter; Hypertension in his mother.   ROS:  Please see the history of present illness.   All other systems are personally reviewed and negative.    Exam:    Vital Signs:  There were no vitals taken for this visit.  Well appearing, alert and conversant, regular work of breathing,  good skin color Eyes- anicteric, neuro- grossly intact, skin- no apparent rash or lesions or cyanosis, mouth- oral mucosa is pink  Labs/Other Tests and Data Reviewed:    Recent Labs: 01/31/2018: TSH 1.320 02/15/2018: ALT 36 02/16/2018: Magnesium 2.0 02/19/2018: Hemoglobin 13.0; Platelets 281 03/15/2018: BUN 9; Creatinine, Ser 1.00; Potassium 4.5; Sodium 138   Wt Readings from Last 3 Encounters:  05/12/18 209 lb 12.8 oz (95.2 kg)  03/15/18 208 lb 9.6 oz (94.6 kg)  03/14/18 210 lb (95.3 kg)     Other studies personally reviewed: Additional studies/ records that were reviewed today include: Long term monitor 08/31/18 personally revewed Review of the above records today demonstrates:   1. Predominant NSR.  2. Still very frequent PVCs, 37% total beats.  2 morphologies noted.    ASSESSMENT & PLAN:    1.  PVCs: High burden at 37%.  He feels mildly improved on mexiletine.  Due to that, we Ronne Savoia plan for a 2-day monitor.  If he continues to have a high burden of PVCs, Aylani Spurlock plan for ablation.  Risks and benefits were discussed include bleeding, tamponade, heart block, stroke.  The patient understands these risks and is agreed to the procedure.  2.  Coronary artery disease: CA stent.  Currently on aspirin and Plavix.  No current chest pain.  3.  Chronic systolic heart failure due to ischemic cardiomyopathy: Ejection fraction 45 to 50%.  Currently on Entresto, Coreg, Aldactone.  No changes.   COVID 19 screen The patient denies symptoms of COVID 19 at this time.  The importance of social distancing was  discussed today.  Follow-up:  3 months  Current medicines are reviewed at length with the patient today.   The patient does not have concerns regarding his medicines.  The following changes were made today:  none  Labs/ tests ordered today include: Zio monitor   Patient Risk:  after full review of this patients clinical status, I feel that they are at moderate risk at this time.  Today, I have spent 8 minutes with the patient with telehealth technology discussing PVCs .    Signed, Johann Gascoigne Meredith Leeds, MD  11/24/2018 8:23 AM     [facility] [address] [place] [address] [phone] (office) [phone] (fax)

## 2018-11-24 NOTE — Telephone Encounter (Signed)
Enrolled patient for a 3 day Zio monitor to be mailed. Brief instructions were gone over with the patient and he knows to expect the monitor to arrive in 3-4 days 

## 2018-11-24 NOTE — Addendum Note (Signed)
Addended by: Stanton Kidney on: 11/24/2018 09:26 AM   Modules accepted: Orders

## 2018-12-06 ENCOUNTER — Telehealth (HOSPITAL_COMMUNITY): Payer: Self-pay

## 2018-12-06 NOTE — Telephone Encounter (Signed)
Called the pt's wife per Dr. Claris Gladden request. She states that she has been trying to send life insurance paper work. Reviewed correct fax number and told her to put it to my attention.

## 2018-12-08 ENCOUNTER — Telehealth (HOSPITAL_COMMUNITY): Payer: Self-pay

## 2018-12-08 NOTE — Telephone Encounter (Signed)
Returned a call to [employer] because she contacted Linus Orn and requested Medical Records for [employer] but did not leave a Fax number nor did she state the dates she needed.

## 2018-12-12 ENCOUNTER — Telehealth (HOSPITAL_COMMUNITY): Payer: Self-pay

## 2018-12-12 NOTE — Telephone Encounter (Signed)
Faxed Medical records to [facility] @ [phone] to Attention Carmelina Dane

## 2018-12-19 ENCOUNTER — Telehealth (HOSPITAL_COMMUNITY): Payer: Self-pay

## 2018-12-19 ENCOUNTER — Telehealth: Payer: Self-pay | Admitting: Cardiology

## 2018-12-19 NOTE — Telephone Encounter (Signed)
New Message:  Pt says he is ready  To have his Ablation scheduled please.Marland Kitchen

## 2018-12-19 NOTE — Telephone Encounter (Signed)
Called patient on 12/19/2018  to inform him that I received a Request for his Medical Records from [facility] and faxed them over to [phone]. I also mailed a copy of the records to : [place] [address].

## 2018-12-20 NOTE — Telephone Encounter (Signed)
Pt aware I will follow up tomorrow to arrange procedure date. He is agreeable to plan.

## 2018-12-21 ENCOUNTER — Telehealth (HOSPITAL_COMMUNITY): Payer: Self-pay

## 2018-12-21 NOTE — Telephone Encounter (Signed)
Lm informing pt I would call him next week to arrange a date for ablation

## 2018-12-21 NOTE — Telephone Encounter (Signed)
Received a request for Medical Records  from [facility]. Faxed to [phone] attention Lydia Guiles., CMA, on 12/21/2018.

## 2018-12-25 ENCOUNTER — Ambulatory Visit (HOSPITAL_COMMUNITY)
Admission: RE | Admit: 2018-12-25 | Discharge: 2018-12-25 | Disposition: A | Discharge status: Home / Self Care | Payer: Managed Care, Other (non HMO) | Source: Ambulatory Visit | Attending: Cardiology | Admitting: Cardiology

## 2018-12-25 ENCOUNTER — Other Ambulatory Visit: Payer: Self-pay

## 2018-12-25 DIAGNOSIS — I2510 Atherosclerotic heart disease of native coronary artery without angina pectoris: Secondary | ICD-10-CM

## 2018-12-25 DIAGNOSIS — Z9861 Coronary angioplasty status: Secondary | ICD-10-CM

## 2018-12-25 DIAGNOSIS — R0789 Other chest pain: Secondary | ICD-10-CM

## 2018-12-25 DIAGNOSIS — I5022 Chronic systolic (congestive) heart failure: Secondary | ICD-10-CM

## 2018-12-25 DIAGNOSIS — R079 Chest pain, unspecified: Secondary | ICD-10-CM

## 2018-12-25 MED ORDER — SPIRONOLACTONE 25 MG PO TABS: 25.0000 mg | ORAL_TABLET | Freq: Every day | ORAL | 1 refills | Status: DC

## 2018-12-25 NOTE — Progress Notes (Signed)
I did advise patient of all future appointments and medication changes. Patient verbalized understanding. A copy was mailed to patient along with order for blood work.

## 2018-12-25 NOTE — Patient Instructions (Addendum)
Labs will need to be completed in one week. (script for labs was included with instructions) We will contact you only if your labs are abnormal.  INCREASE Spirolactone 25mg ( 1 tab) once daily.  Please continue all other medication as prescribed.   Your physician recommends that you schedule a follow-up appointment in: 1 month with an echo and lexiscan prior to your appointment with Dr. Aundra Dubin on Wednesday Oct. 28th at 11am in office.   Your physician has requested that you have an echocardiogram. Echocardiography is a painless test that uses sound waves to create images of your heart. It provides your doctor with information about the size and shape of your heart and how well your heart's chambers and valves are working. This procedure takes approximately one hour. There are no restrictions for this procedure.  Your physician has requested that you have a lexiscan myoview. For further information please visit [email or web address]. Please follow instruction sheet, as given.  At the [facility], you and your health needs are our priority. As part of our continuing mission to provide you with exceptional heart care, we have created designated Provider Care Teams. These Care Teams include your primary Cardiologist (physician) and Advanced Practice Providers (APPs- Physician Assistants and Nurse Practitioners) who all work together to provide you with the care you need, when you need it.   You may see any of the following providers on your designated Care Team at your next follow up: Marland Kitchen Dr Glori Bickers . Dr Loralie Champagne . Darrick Grinder, NP   Please be sure to bring in all your medications bottles to every appointment.

## 2018-12-26 NOTE — Progress Notes (Signed)
Heart Failure TeleHealth Note  Due to national recommendations of social distancing due to COVID 19, Audio/video telehealth visit is felt to be most appropriate for this patient at this time.  See MyChart message from today for patient consent regarding telehealth for [facility].  Date:  12/26/2018   ID:  Lee Moore, DOB [birth year], MRN [id number]  Location: Home  Provider location: [facility] Type of Visit: Established patient   PCP:  Patient, No Pcp Per  Cardiologist: Dr. Shirlee Latch  Chief Complaint: Shortness of breath   History of Present Illness: Lee Moore is a 43 y.o. male who presents via audio/video conferencing for a telehealth visit today.     he denies symptoms worrisome for COVID 19.   Patient has a history of CAD s/p MI in 2017 and again in 5/19.  Patient had initial MI in [place] in 2017, he reports a total of 5 stents to the RCA system. He recently moved to [place] with work.  In 5/19, he had inferior STEMI complicated by multiple episodes of pulseless VT.  He had CPR and ultimately when to the cath lab.  This showed occluded proximal RCA but minimal disease on the left.  The proximal to mid RCA were stented, restoring flow to an acute marginal.  However, the distal RCA, PLV and PDA remained occluded. There were collaterals to the PLV from the left suggesting pre-existing disease.  He underwent hypothermia protocol and had full neurologic recovery from arrest.  Echo in 5/19 showed EF 30-35%.  Cardiac medications were titrated up and he was discharged with a Lifevest.    Repeat echo in 7/19 showed EF improved to 50-55%.    In 8/19, he was admitted with acute appendicitis.  He was managed medically with antibiotics.  Pain resolved, and it was decided not to operate on him immediately due to recent PCI.  He has not had any further appendicitis-type pain.  However,after this, he developed profuse diarrhea and was diagnosed with C difficile  colitis.  He was treated with po vancomycin with resolution.     At prior appointment, he was noted to have frequent PVCs. Zio patch showed 36% PVCs.  Echo was done in 10/19, showing EF 45-50%, inferior hypokinesis.  Lexiscan Cardiolite was done, showing fixed inferior defect, no ischemia.  I started him on amiodarone and sent him to see EP, who talked with him about option of PVC ablation. He had PVC ablation in 11/19. He is now off amiodarone.   Also in 11/19, he had recurrent appendicitis with laparoscopic appendectomy.   He continued to feel PVCs.  Repeat Zio patch in 2/20 actually showed 42% PVCs.  Mexiletine was started.   Zio patch in 5/20 showed 37% PVCs and mexiletine was increased to 250 mg bid.  Echo was done in 8/20 in [place], showing EF 35-40% with moderate diffuse hypokinesis, mildly dilated RV with mildly decreased function.   He is now living in [place] (moved for his job). He has talked with Dr. Elberta Fortis and plans on coming back to [facility] for re-do PVC ablation.  No significant exertional dyspnea, occasional palpitations.  He is concerned about the lower EF on echo in 8/20 in [place], unfortunately I do not have the images to compare to his prior echoes.  He continues to exercise, has a Systems analyst.  For about 2 weeks, he has had episodes of mild pressure in his central chest.  This has no trigger and is not exertional.  He exercises moderately daily with no chest discomfort. Mild lightheadedness if stands fast.   Labs (5/19): K 4.2 => 4.8, creatinine 1.54 => 1.38, hgb 11.7, AST 97, ALT 151 Labs (6/19): K 4.3, creatinine 1.16 => 1.08, LFTs normal Labs (8/19): LDL 29, HDL 51, TGs 53 Labs (9/19): K 3.7, creatinine 1.12 Labs (10/19): Mg 1.9, K 4.5, creatinine 1.16, uric acid 9 Labs (11/19): K 3.7, creatinine 1.04 Labs (12/19): K 4.5, creatinine 1.0  PMH: 1. CAD: MI in 2017, per report patient had a total of 5 stents in the RCA system.  - Inferior STEMI 5/19: LHC showed  occluded RCA with relatively clear left coronary system. DES to proximal-mid RCA restoring flow to an acute marginal, however, the distal RCA/PLV/PDA remained occluded.  There were collaterals to the PLV from the left.   - Lexiscan Cardiolite (10/19): Fixed inferior defect, no ischemia.  2. VT: Patient had pulseless VT in setting of inferior STEMI.   3. Chronic systolic CHF: Ischemic cardiomyopathy.   - Echo (5/19): EF 30-35%, inferior WMA.   - Echo (7/19): EF 50-55% - Echo (10/19): EF 45-50%, mild LVH, inferior hypokinesis, mild MR, normal RV size and systolic function.  - Echo (8/20): EF 35-40%, diffuse hypokinesis, mildly dilated RV with mildly decreased systolic function, mild MR.  4. CKD: Stage II.  5. Acute appendicitis: Managed medically in 8/19.  - Laparoscopic appendectomy in 11/19.  6. C difficile diarrhea 8/19-9/19 7. Gout 8. PVCs: Zio Patch in 10/19 with 36% PVCs.  - PVC ablation in 11/19.  - Zio patch (2/20): 42% PVCs => started mexiletine.  - Zio patch (5/20): 37% PVCs => mexiletine increased.   Current Outpatient Medications  Medication Sig Dispense Refill  . aspirin EC 81 MG tablet Take 81 mg by mouth daily.    Marland Kitchen atorvastatin (LIPITOR) 80 MG tablet TAKE 1 TABLET BY MOUTH EVERY DAY AT 6 PM 30 tablet 11  . carvedilol (COREG) 12.5 MG tablet Take 1.5 tablets (18.75 mg total) by mouth 2 (two) times daily with a meal. 268 tablet 3  . clopidogrel (PLAVIX) 75 MG tablet Take 1 tablet (75 mg total) by mouth daily. 90 tablet 3  . ENTRESTO 49-51 MG TAKE 1 TABLET BY MOUTH TWICE DAILY 60 tablet 3  . mexiletine (MEXITIL) 250 MG capsule Take 1 capsule (250 mg total) by mouth 2 (two) times daily. 60 capsule 3  . sertraline (ZOLOFT) 50 MG tablet Take 1 tablet (50 mg total) by mouth daily. 30 tablet 5  . spironolactone (ALDACTONE) 25 MG tablet Take 1 tablet (25 mg total) by mouth daily. 90 tablet 1   No current facility-administered medications for this encounter.     Allergies:    Patient has no known allergies.   Social History:  The patient  reports that he has never smoked. He has never used smokeless tobacco. He reports previous alcohol use. He reports that he does not use drugs.   Family History:  The patient's family history includes Arthritis in his mother; Asthma in his daughter; Hypertension in his mother.   ROS:  Please see the history of present illness.   All other systems are personally reviewed and negative.   Exam:  (Video/Tele Health Call; Exam is subjective and or/visual.) General: NAD Neck: No JVD Lungs: No respiratory difficulty with conversation.  Abdomen: Nondistended per patient report.  Extremities: No edema.  Neuro: Alert/oriented x 3.   Recent Labs: 01/31/2018: TSH 1.320 02/15/2018: ALT 36 02/16/2018: Magnesium 2.0 02/19/2018: Hemoglobin 13.0;  Platelets 281 03/15/2018: BUN 9; Creatinine, Ser 1.00; Potassium 4.5; Sodium 138  Personally reviewed   Wt Readings from Last 3 Encounters:  11/24/18 93.4 kg (206 lb)  05/12/18 95.2 kg (209 lb 12.8 oz)  03/15/18 94.6 kg (208 lb 9.6 oz)      ASSESSMENT AND PLAN:  1. CAD: Initial MI in 2017, 5 stents to RCA system.  Inferior MI in 5/19, DES to proximal/mid RCA but distal RCA and PLV/PDA remained occluded.  No good options for opening. Left coronary system had minimal disease.  Lexiscan Cardiolite in 10/19 with inferior scar, no ischemia.  He has had atypical chest pain recently but nothing with exertion.  - Continue ASA 81 daily.  - He will continue Plavix 75 mg daily.   - Continue atorvastatin 80 mg daily, need to check lipids => will send him prescription to get labs done in [place] next week.    - I suspect his chest discomfort is noncardiac, but it is starting to concern him, so [facility] [place], will plan for the day prior to PVC ablation.  If symptoms worsen, he now has a cardiologist in [place] and should speak with him or go to the ER.  2. Chronic  systolic CHF: Ischemic cardiomyopathy.  NYHA class II symptoms, improved.  Echo in 10/19 showed EF 45-50% but echo in 8/20 showed EF down to 35-40% with diffuse hypokinesis.  The 8/20 echo was done in [place] so I cannot compare the images.  I am concerned that frequent PVCs have led to worsening of LV function.  - Continue Entresto 49/51 bid.   - Continue Coreg 18.75 mg bid.   - Increase spironolactone to 25 mg daily.  Will have him get BMET next week in [place] and fax to us.  - Not volume overloaded, no need for Lasix. - EF has been of range for ICD.  - I will get a limited [facility], will compare to prior echoes we have here.  3. VT: Pulseless VT with CPR peri-MI.  He underwent hypothermia protocol.  EF recovered to 45-50%, but is now down to 35-40%.    4. 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  Echo did not show significant change in LV systolic function and Cardiolite did not show ischemia.  He has had PVC ablation and is now off amiodarone.  Repeat Zio patch in 2/20 showed 42% PVCs.  Mexiletine was started.  Zio patch in 5/20 showed 37% PVCs, mexiletine was increased to 250 mg bid.  I am concerned that ongoing high PVC burden has worsened his cardiomyopathy.   - He has talked with Dr Elberta Fortisamnitz and will be coming here for re-do PVC ablation.   COVID screen The patient does not have any symptoms that suggest any further testing/ screening at this time.  Social distancing reinforced today.  Patient Risk: After full review of this patients clinical status, I feel that they are at moderate risk for cardiac decompensation at this time.  Relevant cardiac medications were reviewed at length with the patient today. The patient does not have concerns regarding their medications at this time.   Recommended follow-up:  [facility] [place] [facility].  Today, I have spent 19 minutes with the patient with telehealth technology discussing the above issues  .    Signed, Marca Anconaalton Nhat Hearne, MD  12/26/2018  [facility]  [place] [address] [phone] (office) [phone] (fax)

## 2018-12-28 ENCOUNTER — Other Ambulatory Visit (HOSPITAL_COMMUNITY): Payer: Self-pay | Admitting: Cardiology

## 2018-12-28 NOTE — Telephone Encounter (Signed)
Ablation scheduled for 10/29. Pt has an appt the day before this w/ Dr. Aundra Dubin and will be undergoing myoview & echocardiogram testing. Will arrange for rapid screening the morning of procedure and will have pt arrive 3 hours prior to procedure to accommodate this. Instructions for procedure reviewed with pt. Scheduled for H&P virtual visit with Camnitz on 10/8. Post procedure follow up scheduled for 11/24. Patient verbalized understanding and agreeable to plan.

## 2019-01-02 ENCOUNTER — Ambulatory Visit (INDEPENDENT_AMBULATORY_CARE_PROVIDER_SITE_OTHER): Payer: Managed Care, Other (non HMO)

## 2019-01-02 DIAGNOSIS — I493 Ventricular premature depolarization: Secondary | ICD-10-CM

## 2019-01-02 NOTE — Telephone Encounter (Signed)
Pt advised that he will have COVID screening the day before his procedure, after he sees Dr. Aundra Dubin. Screening scheduled for 10/28 @ 11:45 am.  Pt aware that he needs to be there by 11:45 d/t closing at noon. Pt aware to arrive at 8:30 am to [facility] day of his ablation with Dr. Curt Bears.

## 2019-01-04 ENCOUNTER — Telehealth (INDEPENDENT_AMBULATORY_CARE_PROVIDER_SITE_OTHER): Payer: Managed Care, Other (non HMO) | Admitting: Cardiology

## 2019-01-04 ENCOUNTER — Other Ambulatory Visit: Payer: Self-pay

## 2019-01-04 DIAGNOSIS — I493 Ventricular premature depolarization: Secondary | ICD-10-CM | POA: Diagnosis not present

## 2019-01-04 NOTE — Progress Notes (Signed)
Electrophysiology TeleHealth Note   Due to national recommendations of social distancing due to COVID 19, an audio/video telehealth visit is felt to be most appropriate for this patient at this time.  See Epic message for the patient's consent to telehealth for [facility].   Date:  01/04/2019   ID:  Lee Moore, DOB 1976/01/15, MRN [id number]  Location: patient's home  Provider location: [address], [place]  Evaluation Performed: Follow-up visit  PCP:  Patient, No Pcp Per  Cardiologist:  Shirlee Latch Electrophysiologist:  Dr Elberta Fortis  Chief Complaint:  PVC  History of Present Illness:    Lee Moore is a 43 y.o. male who presents via audio/video conferencing for a telehealth visit today.  Since last being seen in our clinic, the patient reports doing very well.  Today, he denies symptoms of palpitations, chest pain, shortness of breath,  lower extremity edema, dizziness, presyncope, or syncope.  The patient is otherwise without complaint today.  The patient denies symptoms of fevers, chills, cough, or new SOB worrisome for COVID 19.  He has a history of coronary artery disease status post RCA stent, ischemic cardiomyopathy with improved ejection fraction, and high PVC burden.  He had a prior ablation but has since had return of PVCs.  He is planned for PVC ablation.  Today, denies symptoms of palpitations, chest pain, shortness of breath, orthopnea, PND, lower extremity edema, claudication, dizziness, presyncope, syncope, bleeding, or neurologic sequela. The patient is tolerating medications without difficulties.    Past Medical History:  Diagnosis Date  . Coronary artery disease   . Essential hypertension   . History of cardiac arrest 07/30/2017   VT   . MI (myocardial infarction) [facility])     Past Surgical History:  Procedure Laterality Date  . CORONARY/GRAFT ACUTE MI REVASCULARIZATION N/A 07/30/2017   Procedure: Coronary/Graft Acute MI Revascularization;   Surgeon: Rinaldo Cloud, MD;  Location: [facility] INVASIVE CV LAB;  Service: Cardiovascular;  Laterality: N/A;  . LAPAROSCOPIC APPENDECTOMY N/A 02/19/2018   Procedure: APPENDECTOMY LAPAROSCOPIC;  Surgeon: Emelia Loron, MD;  Location: [facility] OR;  Service: General;  Laterality: N/A;  . LEFT HEART CATH AND CORONARY ANGIOGRAPHY N/A 07/30/2017   Procedure: LEFT HEART CATH AND CORONARY ANGIOGRAPHY;  Surgeon: Rinaldo Cloud, MD;  Location: [facility] INVASIVE CV LAB;  Service: Cardiovascular;  Laterality: N/A;  . PVC ABLATION N/A 02/10/2018   Procedure: PVC ABLATION;  Surgeon: Regan Lemming, MD;  Location: [facility] INVASIVE CV LAB;  Service: Cardiovascular;  Laterality: N/A;    Current Outpatient Medications  Medication Sig Dispense Refill  . aspirin EC 81 MG tablet Take 81 mg by mouth daily.    Marland Kitchen atorvastatin (LIPITOR) 80 MG tablet TAKE 1 TABLET BY MOUTH EVERY DAY AT 6 PM 30 tablet 11  . carvedilol (COREG) 12.5 MG tablet Take 1.5 tablets (18.75 mg total) by mouth 2 (two) times daily with a meal. 268 tablet 3  . clopidogrel (PLAVIX) 75 MG tablet Take 1 tablet (75 mg total) by mouth daily. 90 tablet 3  . ENTRESTO 49-51 MG TAKE 1 TABLET BY MOUTH TWICE DAILY 60 tablet 3  . mexiletine (MEXITIL) 250 MG capsule Take 1 capsule (250 mg total) by mouth 2 (two) times daily. 60 capsule 3  . sertraline (ZOLOFT) 50 MG tablet Take 1 tablet (50 mg total) by mouth daily. 30 tablet 5  . spironolactone (ALDACTONE) 25 MG tablet TAKE 1/2 TABLET BY MOUTH DAILY 45 tablet 3   No current facility-administered medications for this  visit.     Allergies:   Patient has no known allergies.   Social History:  The patient  reports that he has never smoked. He has never used smokeless tobacco. He reports previous alcohol use. He reports that he does not use drugs.   Family History:  The patient's  family history includes Arthritis in his mother; Asthma in his daughter; Hypertension in his mother.   ROS:  Please see the history of present  illness.   All other systems are personally reviewed and negative.    Exam:    Vital Signs:  There were no vitals taken for this visit.  No acute distress, normal work of breathing, no shortness of breath  Labs/Other Tests and Data Reviewed:    Recent Labs: 01/31/2018: TSH 1.320 02/15/2018: ALT 36 02/16/2018: Magnesium 2.0 02/19/2018: Hemoglobin 13.0; Platelets 281 03/15/2018: BUN 9; Creatinine, Ser 1.00; Potassium 4.5; Sodium 138   Wt Readings from Last 3 Encounters:  11/24/18 206 lb (93.4 kg)  05/12/18 209 lb 12.8 oz (95.2 kg)  03/15/18 208 lb 9.6 oz (94.6 kg)     Other studies personally reviewed: Additional studies/ records that were reviewed today include: ECG 05/12/2018 personally reviewed Review of the above records today demonstrates: Sinus rhythm, PVCs   ASSESSMENT & PLAN:    1.  PVCs:  Patient having a high burden.  We Sloka Volante plan for ablation.  Risks and benefits were discussed and include bleeding, tamponade, heart block, stroke, among others.  The patient understand these risks and is agreed to the procedure.  2.  Coronary artery disease: Status post RCA stent.  Currently on aspirin and Plavix.  3.  Chronic systolic heart failure due to ischemic cardiomyopathy: Ejection fraction 45 to 50%.  Currently on optimal medical therapy.  COVID 19 screen The patient denies symptoms of COVID 19 at this time.  The importance of social distancing was discussed today.  Follow-up:  3 months  Current medicines are reviewed at length with the patient today.   The patient does not have concerns regarding his medicines.  The following changes were made today:  none  Labs/ tests ordered today include:  No orders of the defined types were placed in this encounter.    Patient Risk:  after full review of this patients clinical status, I feel that they are at moderate risk at this time.  Today, I have spent 8 minutes with the patient with telehealth technology discussing PVC .     Signed, Deivi Huckins Meredith Leeds, MD  01/04/2019 11:39 AM     [facility] [address] [place] [address] [phone] (office) [phone] (fax)

## 2019-01-17 ENCOUNTER — Telehealth (HOSPITAL_COMMUNITY): Payer: Self-pay | Admitting: *Deleted

## 2019-01-17 NOTE — Telephone Encounter (Signed)
Patient given detailed instructions per Myocardial Perfusion Study Information Sheet for the test on 01/24/2019 at 0800. Patient notified to arrive 15 minutes early and that it is imperative to arrive on time for appointment to keep from having the test rescheduled.  If you need to cancel or reschedule your appointment, please call the office within 24 hours of your appointment. . Patient verbalized understanding.Teresa Lemmerman, Ranae Palms No my chart available

## 2019-01-23 ENCOUNTER — Other Ambulatory Visit: Payer: Self-pay | Admitting: Cardiology

## 2019-01-24 ENCOUNTER — Ambulatory Visit (HOSPITAL_COMMUNITY)
Admission: RE | Admit: 2019-01-24 | Discharge: 2019-01-24 | Disposition: A | Discharge status: Home / Self Care | Payer: Managed Care, Other (non HMO) | Source: Ambulatory Visit | Attending: Cardiology | Admitting: Cardiology

## 2019-01-24 ENCOUNTER — Other Ambulatory Visit (HOSPITAL_COMMUNITY)
Admission: RE | Admit: 2019-01-24 | Discharge: 2019-01-24 | Disposition: A | Discharge status: Home / Self Care | Payer: Managed Care, Other (non HMO) | Source: Ambulatory Visit | Attending: Cardiology | Admitting: Cardiology

## 2019-01-24 ENCOUNTER — Ambulatory Visit (HOSPITAL_BASED_OUTPATIENT_CLINIC_OR_DEPARTMENT_OTHER): Payer: Managed Care, Other (non HMO)

## 2019-01-24 ENCOUNTER — Telehealth: Payer: Self-pay | Admitting: Cardiology

## 2019-01-24 ENCOUNTER — Other Ambulatory Visit: Payer: Self-pay

## 2019-01-24 ENCOUNTER — Encounter (HOSPITAL_COMMUNITY): Payer: Self-pay | Admitting: Cardiology

## 2019-01-24 ENCOUNTER — Encounter (INDEPENDENT_AMBULATORY_CARE_PROVIDER_SITE_OTHER): Payer: Self-pay

## 2019-01-24 ENCOUNTER — Other Ambulatory Visit (HOSPITAL_COMMUNITY): Payer: Managed Care, Other (non HMO)

## 2019-01-24 VITALS — BP 120/88 | HR 43 | Wt 212.0 lb

## 2019-01-24 DIAGNOSIS — I493 Ventricular premature depolarization: Secondary | ICD-10-CM | POA: Diagnosis not present

## 2019-01-24 DIAGNOSIS — I2510 Atherosclerotic heart disease of native coronary artery without angina pectoris: Secondary | ICD-10-CM | POA: Diagnosis not present

## 2019-01-24 DIAGNOSIS — N182 Chronic kidney disease, stage 2 (mild): Secondary | ICD-10-CM | POA: Diagnosis not present

## 2019-01-24 DIAGNOSIS — I5022 Chronic systolic (congestive) heart failure: Secondary | ICD-10-CM

## 2019-01-24 DIAGNOSIS — I255 Ischemic cardiomyopathy: Secondary | ICD-10-CM

## 2019-01-24 DIAGNOSIS — Z7982 Long term (current) use of aspirin: Secondary | ICD-10-CM | POA: Diagnosis not present

## 2019-01-24 DIAGNOSIS — I130 Hypertensive heart and chronic kidney disease with heart failure and stage 1 through stage 4 chronic kidney disease, or unspecified chronic kidney disease: Secondary | ICD-10-CM | POA: Diagnosis not present

## 2019-01-24 DIAGNOSIS — Z79899 Other long term (current) drug therapy: Secondary | ICD-10-CM | POA: Insufficient documentation

## 2019-01-24 DIAGNOSIS — I252 Old myocardial infarction: Secondary | ICD-10-CM | POA: Diagnosis not present

## 2019-01-24 DIAGNOSIS — Z8249 Family history of ischemic heart disease and other diseases of the circulatory system: Secondary | ICD-10-CM | POA: Insufficient documentation

## 2019-01-24 DIAGNOSIS — R079 Chest pain, unspecified: Secondary | ICD-10-CM | POA: Diagnosis not present

## 2019-01-24 DIAGNOSIS — Z20828 Contact with and (suspected) exposure to other viral communicable diseases: Secondary | ICD-10-CM | POA: Diagnosis not present

## 2019-01-24 DIAGNOSIS — Z9861 Coronary angioplasty status: Secondary | ICD-10-CM

## 2019-01-24 DIAGNOSIS — I351 Nonrheumatic aortic (valve) insufficiency: Secondary | ICD-10-CM | POA: Insufficient documentation

## 2019-01-24 DIAGNOSIS — Z8674 Personal history of sudden cardiac arrest: Secondary | ICD-10-CM | POA: Diagnosis not present

## 2019-01-24 DIAGNOSIS — Z7902 Long term (current) use of antithrombotics/antiplatelets: Secondary | ICD-10-CM | POA: Diagnosis not present

## 2019-01-24 DIAGNOSIS — I472 Ventricular tachycardia: Secondary | ICD-10-CM | POA: Diagnosis not present

## 2019-01-24 DIAGNOSIS — I25119 Atherosclerotic heart disease of native coronary artery with unspecified angina pectoris: Secondary | ICD-10-CM | POA: Insufficient documentation

## 2019-01-24 DIAGNOSIS — M109 Gout, unspecified: Secondary | ICD-10-CM | POA: Diagnosis not present

## 2019-01-24 LAB — BASIC METABOLIC PANEL
Anion gap: 10 (ref 5–15)
BUN/Creatinine Ratio: 13 (ref 9–20)
BUN: 15 mg/dL (ref 6–24)
BUN: 13 mg/dL (ref 6–20)
CO2: 19 mmol/L — ABNORMAL LOW (ref 20–29)
CO2: 20 mmol/L — ABNORMAL LOW (ref 22–32)
Calcium: 9.6 mg/dL (ref 8.7–10.2)
Calcium: 9.3 mg/dL (ref 8.9–10.3)
Chloride: 101 mmol/L (ref 96–106)
Chloride: 106 mmol/L (ref 98–111)
Creatinine, Ser: 1.13 mg/dL (ref 0.76–1.27)
Creatinine, Ser: 1.02 mg/dL (ref 0.61–1.24)
GFR calc Af Amer: 92 mL/min/{1.73_m2} (ref >59)
GFR calc Af Amer: >60 mL/min (ref >60)
GFR calc non Af Amer: 79 mL/min/{1.73_m2} (ref >59)
GFR calc non Af Amer: >60 mL/min (ref >60)
Glucose, Bld: 100 mg/dL — ABNORMAL HIGH (ref 70–99)
Glucose: 98 mg/dL (ref 65–99)
Potassium: 4.7 mmol/L (ref 3.5–5.2)
Potassium: 4.3 mmol/L (ref 3.5–5.1)
Sodium: 138 mmol/L (ref 134–144)
Sodium: 136 mmol/L (ref 135–145)

## 2019-01-24 LAB — CBC
HCT: 45 % (ref 39.0–52.0)
Hemoglobin: 15 g/dL (ref 13.0–17.0)
MCH: 32.3 pg (ref 26.0–34.0)
MCHC: 33.3 g/dL (ref 30.0–36.0)
MCV: 97 fL (ref 80.0–100.0)
Platelets: 285 10*3/uL (ref 150–400)
RBC: 4.64 MIL/uL (ref 4.22–5.81)
RDW: 12.8 % (ref 11.5–15.5)
WBC: 5.4 10*3/uL (ref 4.0–10.5)
nRBC: 0 % (ref 0.0–0.2)

## 2019-01-24 LAB — LIPID PANEL
Cholesterol: 112 mg/dL (ref 0–200)
HDL: 55 mg/dL (ref >40)
LDL Cholesterol: 45 mg/dL (ref 0–99)
Total CHOL/HDL Ratio: 2 RATIO
Triglycerides: 60 mg/dL (ref <150)
VLDL: 12 mg/dL (ref 0–40)

## 2019-01-24 LAB — LIPID PANEL W/O CHOL/HDL RATIO
Cholesterol, Total: 117 mg/dL (ref 100–199)
HDL: 57 mg/dL (ref >39)
LDL Chol Calc (NIH): 44 mg/dL (ref 0–99)
Triglycerides: 77 mg/dL (ref 0–149)
VLDL Cholesterol Cal: 16 mg/dL (ref 5–40)

## 2019-01-24 LAB — MYOCARDIAL PERFUSION IMAGING
Peak HR: 82 {beats}/min
Rest HR: 61 {beats}/min
SDS: 0
SRS: 11
SSS: 11
TID: 1

## 2019-01-24 LAB — ECHOCARDIOGRAM COMPLETE
Height: 70 in
Weight: 3280 oz

## 2019-01-24 LAB — SPECIMEN STATUS REPORT

## 2019-01-24 LAB — SARS CORONAVIRUS 2 BY RT PCR (HOSPITAL ORDER, PERFORMED IN ~~LOC~~ HOSPITAL LAB): SARS Coronavirus 2: NEGATIVE

## 2019-01-24 MED ORDER — REGADENOSON 0.4 MG/5ML IV SOLN
0.4000 mg | Freq: Once | INTRAVENOUS | Status: AC
  Administered 2019-01-24: 0.4 mg via INTRAVENOUS

## 2019-01-24 MED ORDER — TECHNETIUM TC 99M TETROFOSMIN IV KIT
10.9000 | PACK | Freq: Once | INTRAVENOUS | Status: AC | PRN
  Administered 2019-01-24: 10.9 via INTRAVENOUS
  Filled 2019-01-24: qty 11

## 2019-01-24 MED ORDER — TECHNETIUM TC 99M TETROFOSMIN IV KIT
31.9000 | PACK | Freq: Once | INTRAVENOUS | Status: AC | PRN
  Administered 2019-01-24: 31.9 via INTRAVENOUS
  Filled 2019-01-24: qty 32

## 2019-01-24 NOTE — Telephone Encounter (Signed)
Pt went to [facility] this afternoon for testing instead.  Hospital staff arranged this

## 2019-01-24 NOTE — Patient Instructions (Addendum)
Routine lab work today. Will notify you of abnormal results  Follow up in 1 month (virtual visit)  You have been referred to your local cardiac rehab.

## 2019-01-24 NOTE — Telephone Encounter (Signed)
° ° °  Patient missed covid testing today, procedure scheduled for 10/19  Please call

## 2019-01-24 NOTE — Progress Notes (Signed)
Date:  01/24/2019   ID:  Lee Moore, DOB [birth year], MRN [id number]  Provider location: [facility] Type of Visit: Established patient   PCP:  Patient, No Pcp Per  Cardiologist: Dr. Aundra Dubin  Chief Complaint: Shortness of breath   History of Present Illness: Lee Moore is a 43 y.o. male who has a history of CAD s/p MI in 2017 and again in 5/19.  Patient had initial MI in [place] in 2017, he reports a total of 5 stents to the RCA system. He recently moved to [place] with work.  In 5/19, he had inferior STEMI complicated by multiple episodes of pulseless VT.  He had CPR and ultimately when to the cath lab.  This showed occluded proximal RCA but minimal disease on the left.  The proximal to mid RCA were stented, restoring flow to an acute marginal.  However, the distal RCA, PLV and PDA remained occluded. There were collaterals to the PLV from the left suggesting pre-existing disease.  He underwent hypothermia protocol and had full neurologic recovery from arrest.  Echo in 5/19 showed EF 30-35%.  Cardiac medications were titrated up and he was discharged with a Lifevest.    Repeat echo in 7/19 showed EF improved to 50-55%.    In 8/19, he was admitted with acute appendicitis.  He was managed medically with antibiotics.  Pain resolved, and it was decided not to operate on him immediately due to recent PCI.  He has not had any further appendicitis-type pain.  However,after this, he developed profuse diarrhea and was diagnosed with C difficile colitis.  He was treated with po vancomycin with resolution.     At prior appointment, he was noted to have frequent PVCs. Zio patch showed 36% PVCs.  Echo was done in 10/19, showing EF 45-50%, inferior hypokinesis.  Lexiscan Cardiolite was done, showing fixed inferior defect, no ischemia.  I started him on amiodarone and sent him to see EP, who talked with him about option of PVC ablation. He had PVC ablation in 11/19. He is now  off amiodarone.   Also in 11/19, he had recurrent appendicitis with laparoscopic appendectomy.   He continued to feel PVCs.  Repeat Zio patch in 2/20 actually showed 42% PVCs.  Mexiletine was started.   Zio patch in 5/20 showed 37% PVCs and mexiletine was increased to 250 mg bid.  Echo was done in 8/20 in [place], showing EF 35-40% with moderate diffuse hypokinesis, mildly dilated RV with mildly decreased function.    Echo was done today and reviewed, EF 35% with mild LV dilation, inferior/inferoseptal akinesis, mildly decreased RV systolic function. Cardiolite was done today, showing prior inferior infarct with no ischemia.   He returns today for followup of CAD and CHF.  He is still having frequent PVCs.  He feels occasional palpitations.  At times, he gets mildly lightheaded with standing.  However, he is most worried about episodes of sudden lightheadedness that can occur with no warning.  He has not passed out and the episodes do not last long.  He has episodes of atypical chest pain, not with exertion.  No significant exertional dyspnea.  He exercises regularly.   ECG (personally reviewed): NSR with PVCs, anterior and inferior TWIs  Labs (5/19): K 4.2 => 4.8, creatinine 1.54 => 1.38, hgb 11.7, AST 97, ALT 151 Labs (6/19): K 4.3, creatinine 1.16 => 1.08, LFTs normal Labs (8/19): LDL 29, HDL 51, TGs 53 Labs (9/19): K 3.7, creatinine 1.12 Labs (  10/19): Mg 1.9, K 4.5, creatinine 1.16, uric acid 9 Labs (11/19): K 3.7, creatinine 1.04 Labs (12/19): K 4.5, creatinine 1.0  PMH: 1. CAD: MI in 2017, per report patient had a total of 5 stents in the RCA system.  - Inferior STEMI 5/19: LHC showed occluded RCA with relatively clear left coronary system. DES to proximal-mid RCA restoring flow to an acute marginal, however, the distal RCA/PLV/PDA remained occluded.  There were collaterals to the PLV from the left.   - Lexiscan Cardiolite (10/19): Fixed inferior defect, no ischemia.  - Lexiscan  Cardiolite (10/20): Fixed inferior defect, no ischemia.  2. VT: Patient had pulseless VT in setting of inferior STEMI.   3. Chronic systolic CHF: Ischemic cardiomyopathy.   - Echo (5/19): EF 30-35%, inferior WMA.   - Echo (7/19): EF 50-55% - Echo (10/19): EF 45-50%, mild LVH, inferior hypokinesis, mild MR, normal RV size and systolic function.  - Echo (8/20): EF 35-40%, diffuse hypokinesis, mildly dilated RV with mildly decreased systolic function, mild MR.  - Echo (10/20): EF 35% with mild LV dilation, inferior/inferoseptal akinesis, mildly decreased RV systolic function. 4. CKD: Stage II.  5. Acute appendicitis: Managed medically in 8/19.  - Laparoscopic appendectomy in 11/19.  6. C difficile diarrhea 8/19-9/19 7. Gout 8. PVCs: Zio Patch in 10/19 with 36% PVCs.  - PVC ablation in 11/19.  - Zio patch (2/20): 42% PVCs => started mexiletine.  - Zio patch (5/20): 37% PVCs => mexiletine increased.   Current Outpatient Medications  Medication Sig Dispense Refill   allopurinol (ZYLOPRIM) 100 MG tablet Take 100 mg by mouth daily as needed (gout).      aspirin EC 81 MG tablet Take 81 mg by mouth daily.     atorvastatin (LIPITOR) 80 MG tablet Take 80 mg by mouth daily.     carvedilol (COREG) 12.5 MG tablet Take 1.5 tablets (18.75 mg total) by mouth 2 (two) times daily with a meal. 268 tablet 3   clopidogrel (PLAVIX) 75 MG tablet Take 1 tablet (75 mg total) by mouth daily. 90 tablet 3   mexiletine (MEXITIL) 250 MG capsule Take 1 capsule (250 mg total) by mouth 2 (two) times daily. 60 capsule 3   OVER THE COUNTER MEDICATION Take 1 capsule by mouth daily. Sea moss otc supplement     sacubitril-valsartan (ENTRESTO) 49-51 MG Take 1 tablet by mouth 2 (two) times daily.     spironolactone (ALDACTONE) 25 MG tablet Take 25 mg by mouth daily.     No current facility-administered medications for this encounter.     Allergies:   Patient has no known allergies.   Social History:  The patient   reports that he has never smoked. He has never used smokeless tobacco. He reports previous alcohol use. He reports that he does not use drugs.   Family History:  The patient's family history includes Arthritis in his mother; Asthma in his daughter; Hypertension in his mother.   ROS:  Please see the history of present illness.   All other systems are personally reviewed and negative.   Exam:   BP 120/88    Pulse (!) 43    Wt 96.2 kg (212 lb)    SpO2 100%    BMI 30.42 kg/m  General: NAD Neck: No JVD, no thyromegaly or thyroid nodule.  Lungs: Clear to auscultation bilaterally with normal respiratory effort. CV: Nondisplaced PMI.  Heart irregular S1/S2 (PVCs), no S3/S4, no murmur.  No peripheral edema.  No carotid bruit.  Normal pedal pulses.  Abdomen: Soft, nontender, no hepatosplenomegaly, no distention.  Skin: Intact without lesions or rashes.  Neurologic: Alert and oriented x 3.  Psych: Normal affect. Extremities: No clubbing or cyanosis.  HEENT: Normal.   Recent Labs: 01/31/2018: TSH 1.320 02/15/2018: ALT 36 02/16/2018: Magnesium 2.0 01/24/2019: BUN 13; Creatinine, Ser 1.02; Hemoglobin 15.0; Platelets 285; Potassium 4.3; Sodium 136  Personally reviewed   Wt Readings from Last 3 Encounters:  01/24/19 96.2 kg (212 lb)  01/24/19 93 kg (205 lb)  11/24/18 93.4 kg (206 lb)     ASSESSMENT AND PLAN:  1. CAD: Initial MI in 2017, 5 stents to RCA system.  Inferior MI in 5/19, DES to proximal/mid RCA but distal RCA and PLV/PDA remained occluded.  No good options for opening. Left coronary system had minimal disease.  Lexiscan Cardiolite in 10/19 with inferior scar, no ischemia.  He has had atypical chest pain recently but nothing with exertion. Lexiscan Cardiolite was done today, again showing inferior scar with no ischemia, low risk.  I suspect that his atypical chest pain is noncardiac.  - Continue ASA 81 daily.  - He will continue Plavix 75 mg daily.   - Continue atorvastatin 80 mg  daily, check lipids today.    2. Chronic systolic CHF: Ischemic cardiomyopathy.  NYHA class II symptoms, improved.  Echo in 10/19 showed EF 45-50% but echo in 8/20 showed EF down to 35-40% with diffuse hypokinesis.  The 8/20 echo was done in [place] so I cannot compare the images.  I had him get an echo today and reviewed it, EF 35% with basal to mid inferior and inferoseptal akinesis, the remainder of the LV appeared mildly hypokinetic.  I am concerned that frequent PVCs have led to worsening of LV function.  - Continue Entresto 49/51 bid.   - Continue Coreg 18.75 mg bid.   - Continue spironolactone 25 mg daily.  BMET today.  - Not volume overloaded, no need for Lasix. - After PVC ablation, will repeat echo to see if EF has improved.  He is borderline for ICD currently.  - We will try to arrange for cardiac rehab in [place].  3. VT: Pulseless VT with CPR peri-MI.  He underwent hypothermia protocol.  EF recovered to 45-50%, but is now down to 35%.  He has had episodes of sudden lightheadedness without passing out that is not positional, ?short runs VT.   - EP study tomorrow, discussed with Dr Elberta Fortisamnitz and he will try to trigger VT.  4. 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  Echo did not show significant change in LV systolic function and Cardiolite did not show ischemia.  He has had PVC ablation and is now off amiodarone.  Repeat Zio patch in 2/20 showed 42% PVCs.  Mexiletine was started.  Zio patch in 5/20 showed 37% PVCs, mexiletine was increased to 250 mg bid.  I am concerned that ongoing high PVC burden has worsened his cardiomyopathy.   - EP study tomorrow with PVC ablation.  - Will need to repeat echo several months after ablation to see if EF has improved.   Signed, Marca Anconaalton Johana Hopkinson, MD  01/24/2019  [facility] [address] [facility] [place] [address] [phone] (office) [phone] (fax)

## 2019-01-25 ENCOUNTER — Telehealth (HOSPITAL_COMMUNITY): Payer: Self-pay

## 2019-01-25 ENCOUNTER — Encounter (HOSPITAL_COMMUNITY): Payer: Self-pay | Admitting: Certified Registered"

## 2019-01-25 ENCOUNTER — Ambulatory Visit (HOSPITAL_COMMUNITY): Payer: Managed Care, Other (non HMO)

## 2019-01-25 ENCOUNTER — Other Ambulatory Visit: Payer: Self-pay

## 2019-01-25 ENCOUNTER — Ambulatory Visit (HOSPITAL_COMMUNITY): Payer: Managed Care, Other (non HMO) | Admitting: Anesthesiology

## 2019-01-25 ENCOUNTER — Encounter (HOSPITAL_COMMUNITY)
Admission: RE | Disposition: A | Discharge status: Home / Self Care | Payer: Managed Care, Other (non HMO) | Source: Ambulatory Visit | Attending: Cardiology

## 2019-01-25 ENCOUNTER — Ambulatory Visit (HOSPITAL_COMMUNITY)
Admission: RE | Admit: 2019-01-25 | Discharge: 2019-01-25 | Disposition: A | Discharge status: Home / Self Care | Payer: Managed Care, Other (non HMO) | Source: Ambulatory Visit | Attending: Cardiology | Admitting: Cardiology

## 2019-01-25 DIAGNOSIS — Z7982 Long term (current) use of aspirin: Secondary | ICD-10-CM | POA: Insufficient documentation

## 2019-01-25 DIAGNOSIS — Z7902 Long term (current) use of antithrombotics/antiplatelets: Secondary | ICD-10-CM | POA: Diagnosis not present

## 2019-01-25 DIAGNOSIS — Z79899 Other long term (current) drug therapy: Secondary | ICD-10-CM | POA: Insufficient documentation

## 2019-01-25 DIAGNOSIS — I2510 Atherosclerotic heart disease of native coronary artery without angina pectoris: Secondary | ICD-10-CM | POA: Diagnosis not present

## 2019-01-25 DIAGNOSIS — I252 Old myocardial infarction: Secondary | ICD-10-CM | POA: Insufficient documentation

## 2019-01-25 DIAGNOSIS — I493 Ventricular premature depolarization: Secondary | ICD-10-CM | POA: Diagnosis not present

## 2019-01-25 DIAGNOSIS — I5022 Chronic systolic (congestive) heart failure: Secondary | ICD-10-CM | POA: Insufficient documentation

## 2019-01-25 DIAGNOSIS — I255 Ischemic cardiomyopathy: Secondary | ICD-10-CM | POA: Diagnosis not present

## 2019-01-25 DIAGNOSIS — I110 Hypertensive heart disease with heart failure: Secondary | ICD-10-CM | POA: Diagnosis not present

## 2019-01-25 DIAGNOSIS — R1110 Vomiting, unspecified: Secondary | ICD-10-CM

## 2019-01-25 HISTORY — PX: PVC ABLATION: EP1236

## 2019-01-25 SURGERY — PVC ABLATION
Anesthesia: General

## 2019-01-25 MED ORDER — PHENOL 1.4 % MT LIQD
1.0000 | OROMUCOSAL | Status: DC | PRN
  Administered 2019-01-25: 1 via OROMUCOSAL
  Filled 2019-01-25: qty 177

## 2019-01-25 MED ORDER — PROPOFOL 10 MG/ML IV BOLUS
INTRAVENOUS | Status: DC | PRN
  Administered 2019-01-25: 40 mg via INTRAVENOUS
  Administered 2019-01-25 (×3): 20 mg via INTRAVENOUS

## 2019-01-25 MED ORDER — ONDANSETRON HCL 4 MG/2ML IJ SOLN: 4.0000 mg | Freq: Four times a day (QID) | INTRAMUSCULAR | Status: DC | PRN

## 2019-01-25 MED ORDER — HEPARIN SODIUM (PORCINE) 1000 UNIT/ML IJ SOLN
INTRAMUSCULAR | Status: AC
  Filled 2019-01-25: qty 1

## 2019-01-25 MED ORDER — BUPIVACAINE HCL (PF) 0.25 % IJ SOLN
INTRAMUSCULAR | Status: DC | PRN
  Administered 2019-01-25: 60 mL

## 2019-01-25 MED ORDER — HEPARIN SODIUM (PORCINE) 1000 UNIT/ML IJ SOLN
INTRAMUSCULAR | Status: DC | PRN
  Administered 2019-01-25 (×2): 5000 [IU] via INTRAVENOUS

## 2019-01-25 MED ORDER — SUCCINYLCHOLINE CHLORIDE 200 MG/10ML IV SOSY
PREFILLED_SYRINGE | INTRAVENOUS | Status: DC | PRN
  Administered 2019-01-25: 100 mg via INTRAVENOUS

## 2019-01-25 MED ORDER — SODIUM CHLORIDE 0.9 % IV SOLN: 250.0000 mL | INTRAVENOUS | Status: DC | PRN

## 2019-01-25 MED ORDER — SODIUM CHLORIDE 0.9% FLUSH: 3.0000 mL | INTRAVENOUS | Status: DC | PRN

## 2019-01-25 MED ORDER — PROPOFOL 500 MG/50ML IV EMUL
INTRAVENOUS | Status: DC | PRN
  Administered 2019-01-25: 75 ug/kg/min via INTRAVENOUS

## 2019-01-25 MED ORDER — HEPARIN (PORCINE) IN NACL 1000-0.9 UT/500ML-% IV SOLN
INTRAVENOUS | Status: AC
  Filled 2019-01-25: qty 500

## 2019-01-25 MED ORDER — ONDANSETRON HCL 4 MG/2ML IJ SOLN
INTRAMUSCULAR | Status: DC | PRN
  Administered 2019-01-25: 4 mg via INTRAVENOUS

## 2019-01-25 MED ORDER — MIDAZOLAM HCL 5 MG/5ML IJ SOLN
INTRAMUSCULAR | Status: DC | PRN
  Administered 2019-01-25: 2 mg via INTRAVENOUS

## 2019-01-25 MED ORDER — GLYCOPYRROLATE PF 0.2 MG/ML IJ SOSY
PREFILLED_SYRINGE | INTRAMUSCULAR | Status: DC | PRN
  Administered 2019-01-25: .2 mg via INTRAVENOUS

## 2019-01-25 MED ORDER — HEPARIN (PORCINE) IN NACL 1000-0.9 UT/500ML-% IV SOLN
INTRAVENOUS | Status: DC | PRN
  Administered 2019-01-25 (×2): 500 mL

## 2019-01-25 MED ORDER — ACETAMINOPHEN 325 MG PO TABS: 650.0000 mg | ORAL_TABLET | ORAL | Status: DC | PRN

## 2019-01-25 MED ORDER — SODIUM CHLORIDE 0.9% FLUSH: 3.0000 mL | Freq: Two times a day (BID) | INTRAVENOUS | Status: DC

## 2019-01-25 MED ORDER — SODIUM CHLORIDE 0.9 % IV SOLN
INTRAVENOUS | Status: DC
  Administered 2019-01-25 (×2): via INTRAVENOUS

## 2019-01-25 MED ORDER — FENTANYL CITRATE (PF) 100 MCG/2ML IJ SOLN
INTRAMUSCULAR | Status: DC | PRN
  Administered 2019-01-25: 50 ug via INTRAVENOUS

## 2019-01-25 MED ORDER — ACETAMINOPHEN 325 MG PO TABS
ORAL_TABLET | ORAL | Status: AC
  Filled 2019-01-25: qty 2

## 2019-01-25 SURGICAL SUPPLY — 19 items
BLANKET WARM UNDERBOD FULL ACC (MISCELLANEOUS) ×1 IMPLANT
CATH JOSEPH QUAD ALLRED 6F REP (CATHETERS) ×1 IMPLANT
CATH MAPPNG PENTARAY F 2-6-2MM (CATHETERS) IMPLANT
CATH SMTCH THERMOCOOL SF DF (CATHETERS) ×1 IMPLANT
CATH SOUNDSTAR ECO REPROCESSED (CATHETERS) ×1 IMPLANT
DEVICE CLOSURE PERCLS PRGLD 6F (VASCULAR PRODUCTS) IMPLANT
PACK EP LATEX FREE (CUSTOM PROCEDURE TRAY) ×1
PACK EP LF (CUSTOM PROCEDURE TRAY) ×1 IMPLANT
PAD PRO RADIOLUCENT 2001M-C (PAD) ×2 IMPLANT
PATCH CARTO3 (PAD) ×1 IMPLANT
PENTARAY F 2-6-2MM (CATHETERS) ×2
PERCLOSE PROGLIDE 6F (VASCULAR PRODUCTS) ×6
SHEATH AVANTI 11F 11CM (SHEATH) ×1 IMPLANT
SHEATH BAYLIS SUREFLEX  M 8.5 (SHEATH) ×1
SHEATH BAYLIS SUREFLEX M 8.5 (SHEATH) IMPLANT
SHEATH PINNACLE 6F 10CM (SHEATH) ×1 IMPLANT
SHEATH PINNACLE 8F 10CM (SHEATH) ×1 IMPLANT
SHEATH PROBE COVER 6X72 (BAG) ×1 IMPLANT
TUBING SMART ABLATE COOLFLOW (TUBING) ×1 IMPLANT

## 2019-01-25 NOTE — Anesthesia Preprocedure Evaluation (Signed)
Anesthesia Evaluation  Patient identified by MRN, date of birth, ID band Patient awake    Reviewed: Allergy & Precautions, NPO status , Patient's Chart, lab work & pertinent test results  Airway Mallampati: II  TM Distance: >3 FB Neck ROM: Full    Dental no notable dental hx.    Pulmonary neg pulmonary ROS,    Pulmonary exam normal breath sounds clear to auscultation       Cardiovascular hypertension, + CAD, + Past MI, + CABG and +CHF  Normal cardiovascular exam Rhythm:Regular Rate:Normal  1. Left ventricular ejection fraction, by visual estimation, is 35%. The left ventricle has moderately decreased function. Mildly increased left ventricular size. There is no left ventricular hypertrophy. Basal to mid inferior and inferoseptal akinesis.  2. Left ventricular diastolic Doppler parameters are indeterminate pattern of LV diastolic filling.  3. Global right ventricle has mildly reduced systolic function.The right ventricular size is mildly enlarged. No increase in right ventricular wall thickness.  4. Left atrial size was normal.  5. Right atrial size was normal.  6. Trivial pericardial effusion is present.  7. The mitral valve is normal in structure. Trace mitral valve regurgitation. No evidence of mitral stenosis.  8. The tricuspid valve is normal in structure. Tricuspid valve regurgitation is trivial.  9. The aortic valve is tricuspid Aortic valve regurgitation was not visualized by color flow Doppler. Structurally normal aortic valve, with no evidence of sclerosis or stenosis. 10. The tricuspid regurgitant velocity is 1.94 m/s, and with an assumed right atrial pressure of 3 mmHg, the estimated right ventricular systolic pressure is normal at 18.1 mmHg. 11. The inferior vena cava is normal in size with greater than 50% respiratory variability, suggesting right atrial pressure of 3 mmHg. 12. There were very frequent PVCs.     Neuro/Psych negative neurological ROS  negative psych ROS   GI/Hepatic negative GI ROS, Neg liver ROS,   Endo/Other  negative endocrine ROS  Renal/GU negative Renal ROS  negative genitourinary   Musculoskeletal negative musculoskeletal ROS (+)   Abdominal   Peds negative pediatric ROS (+)  Hematology negative hematology ROS (+)   Anesthesia Other Findings   Reproductive/Obstetrics negative OB ROS                             Anesthesia Physical Anesthesia Plan  ASA: III  Anesthesia Plan: General   Post-op Pain Management:    Induction: Intravenous  PONV Risk Score and Plan: 2 and Ondansetron, Dexamethasone and Treatment may vary due to age or medical condition  Airway Management Planned: Oral ETT  Additional Equipment:   Intra-op Plan:   Post-operative Plan: Extubation in OR  Informed Consent: I have reviewed the patients History and Physical, chart, labs and discussed the procedure including the risks, benefits and alternatives for the proposed anesthesia with the patient or authorized representative who has indicated his/her understanding and acceptance.     Dental advisory given  Plan Discussed with: CRNA and Surgeon  Anesthesia Plan Comments:         Anesthesia Quick Evaluation

## 2019-01-25 NOTE — H&P (Signed)
Lee Moore has presented today for surgery, with the diagnosis of PVCs.  The various methods of treatment have been discussed with the patient and family. After consideration of risks, benefits and other options for treatment, the patient has consented to  Procedure(s): Catheter ablation as a surgical intervention .  Risks include but not limited to bleeding, tamponade, heart block, stroke, damage to surrounding organs, among others. The patient's history has been reviewed, patient examined, no change in status, stable for surgery.  I have reviewed the patient's chart and labs.  Questions were answered to the patient's satisfaction.    Kendrah Lovern Curt Bears, MD 01/25/2019 10:26 AM

## 2019-01-25 NOTE — Telephone Encounter (Signed)
Pt wife aware of all results. Appreciative.

## 2019-01-25 NOTE — Anesthesia Postprocedure Evaluation (Signed)
Anesthesia Post Note  Patient: Lee Moore  Procedure(s) Performed: PVC ABLATION (N/A )     Patient location during evaluation: PACU Anesthesia Type: General Level of consciousness: awake and alert Pain management: pain level controlled Vital Signs Assessment: post-procedure vital signs reviewed and stable Respiratory status: spontaneous breathing, nonlabored ventilation, respiratory function stable and patient connected to nasal cannula oxygen Cardiovascular status: blood pressure returned to baseline and stable Postop Assessment: no apparent nausea or vomiting Anesthetic complications: no    Last Vitals:  Vitals:   01/25/19 1535 01/25/19 1545  BP: 119/72   Pulse: 88   Resp: 13   Temp:  (!) 36.3 C  SpO2: 96%     Last Pain:  Vitals:   01/25/19 1545  TempSrc: Temporal  PainSc:                  [name]

## 2019-01-25 NOTE — Telephone Encounter (Signed)
-----   Message from Larey Dresser, MD sent at 01/25/2019  2:02 PM EDT ----- Prior inferior MI, no ischemia. Would hold off on cath.

## 2019-01-25 NOTE — Anesthesia Procedure Notes (Signed)
Procedure Name: Intubation Date/Time: 01/25/2019 12:21 PM Performed by: Moshe Salisbury, CRNA Pre-anesthesia Checklist: Patient identified, Emergency Drugs available, Suction available and Patient being monitored Patient Re-evaluated:Patient Re-evaluated prior to induction Oxygen Delivery Method: Circle System Utilized Preoxygenation: Pre-oxygenation with 100% oxygen Induction Type: IV induction and Rapid sequence Laryngoscope Size: Mac and 4 Grade View: Grade I Tube type: Oral Tube size: 8.0 mm Number of attempts: 1 Airway Equipment and Method: Stylet (Oropharynx suctioned for clear and blood tinged secretions, cords clear, oropharynx iritated from repeated suctioning.) Placement Confirmation: ETT inserted through vocal cords under direct vision,  positive ETCO2 and breath sounds checked- equal and bilateral Secured at: 22 cm Tube secured with: Tape Dental Injury: Teeth and Oropharynx as per pre-operative assessment

## 2019-01-25 NOTE — Anesthesia Procedure Notes (Signed)
Procedure Name: MAC Date/Time: 01/25/2019 11:00 AM Performed by: Moshe Salisbury, CRNA Pre-anesthesia Checklist: Patient identified, Emergency Drugs available, Suction available, Patient being monitored and Timeout performed Patient Re-evaluated:Patient Re-evaluated prior to induction Oxygen Delivery Method: Nasal cannula Placement Confirmation: positive ETCO2 Dental Injury: Teeth and Oropharynx as per pre-operative assessment

## 2019-01-25 NOTE — Telephone Encounter (Signed)
-----   Message from Larey Dresser, MD sent at 01/25/2019  2:04 PM EDT ----- Good lipids, labs ok

## 2019-01-25 NOTE — Discharge Instructions (Signed)
Cardiac Ablation, Care After This sheet gives you information about how to care for yourself after your procedure. Your health care provider may also give you more specific instructions. If you have problems or questions, contact your health care provider. What can I expect after the procedure? After the procedure, it is common to have:  Bruising around your puncture site.  Tenderness around your puncture site.  Skipped heartbeats.  Tiredness (fatigue).  Follow these instructions at home: Puncture site care   Follow instructions from your health care provider about how to take care of your puncture site. Make sure you: ? If present, leave stitches (sutures), skin glue, or adhesive strips in place. These skin closures may need to stay in place for up to 2 weeks. If adhesive strip edges start to loosen and curl up, you may trim the loose edges. Do not remove adhesive strips completely unless your health care provider tells you to do that.  Check your puncture site every day for signs of infection. Check for: ? Redness, swelling, or pain. ? Fluid or blood. If your puncture site starts to bleed, lie down on your back, apply firm pressure to the area, and contact your health care provider. ? Warmth. ? Pus or a bad smell. Driving  Do not drive for at least 4 days after your procedure or however long your health care provider recommends.  Do not drive or use heavy machinery while taking prescription pain medicine. Activity  Avoid activities that take a lot of effort for at least 7 days after your procedure.  Do not lift anything that is heavier than 5 lb (4.5 kg) for one week.   No sexual activity for 1 week.   Return to your normal activities as told by your health care provider. Ask your health care provider what activities are safe for you. General instructions  Take over-the-counter and prescription medicines only as told by your health care provider.  Do not use any products  that contain nicotine or tobacco, such as cigarettes and e-cigarettes. If you need help quitting, ask your health care provider.  You may shower after 24 hours, but Do not take baths, swim, or use a hot tub for 1 week.   Do not drink alcohol for 24 hours after your procedure.  Keep all follow-up visits as told by your health care provider. This is important. Contact a health care provider if:  You have redness, mild swelling, or pain around your puncture site.  You have fluid or blood coming from your puncture site that stops after applying firm pressure to the area.  Your puncture site feels warm to the touch.  You have pus or a bad smell coming from your puncture site.  You have a fever.  You have chest pain or discomfort that spreads to your neck, jaw, or arm.  You are sweating a lot.  You feel nauseous.  You have a fast or irregular heartbeat.  You have shortness of breath.  You are dizzy or light-headed and feel the need to lie down.  You have pain or numbness in the arm or leg closest to your puncture site. Get help right away if:  Your puncture site suddenly swells.  Your puncture site is bleeding and the bleeding does not stop after applying firm pressure to the area. These symptoms may represent a serious problem that is an emergency. Do not wait to see if the symptoms will go away. Get medical help right away. Call your   local emergency services (911 in the [place] Do not drive yourself to the hospital. Summary  After the procedure, it is normal to have bruising and tenderness at the puncture site in your groin, neck, or forearm.  Check your puncture site every day for signs of infection.  Get help right away if your puncture site is bleeding and the bleeding does not stop after applying firm pressure to the area. This is a medical emergency. This information is not intended to replace advice given to you by your health care provider. Make sure you discuss any  questions you have with your health care provider.

## 2019-01-25 NOTE — Transfer of Care (Signed)
Immediate Anesthesia Transfer of Care Note  Patient: Tahjir Silveria  Procedure(s) Performed: PVC ABLATION (N/A )  Patient Location: Cath Lab  Anesthesia Type:General  Level of Consciousness: drowsy and patient cooperative  Airway & Oxygen Therapy: Patient Spontanous Breathing and Patient connected to nasal cannula oxygen  Post-op Assessment: Report given to RN, Post -op Vital signs reviewed and stable and Patient moving all extremities  Post vital signs: Reviewed and stable  Last Vitals:  Vitals Value Taken Time  BP 111/80 01/25/19 1451  Temp    Pulse 97 01/25/19 1454  Resp 14 01/25/19 1454  SpO2 100 % 01/25/19 1454  Vitals shown include unvalidated device data.  Last Pain:  Vitals:   01/25/19 1055  TempSrc:   PainSc: 0-No pain         Complications: No apparent anesthesia complications

## 2019-01-25 NOTE — Progress Notes (Signed)
PCXR done

## 2019-01-25 NOTE — Progress Notes (Signed)
No bleeding or hematoma noted after ambulation 

## 2019-01-26 ENCOUNTER — Other Ambulatory Visit (HOSPITAL_COMMUNITY): Payer: Self-pay

## 2019-01-26 ENCOUNTER — Telehealth: Payer: Self-pay

## 2019-01-26 ENCOUNTER — Other Ambulatory Visit (HOSPITAL_COMMUNITY): Payer: Self-pay | Admitting: Cardiology

## 2019-01-26 ENCOUNTER — Encounter (HOSPITAL_COMMUNITY): Payer: Self-pay | Admitting: Cardiology

## 2019-01-26 MED ORDER — SPIRONOLACTONE 25 MG PO TABS: 25.0000 mg | ORAL_TABLET | Freq: Every day | ORAL | 3 refills | Status: AC

## 2019-01-26 MED ORDER — TRAMADOL HCL 50 MG PO TABS: 50.0000 mg | ORAL_TABLET | Freq: Two times a day (BID) | ORAL | 0 refills | Status: AC | PRN

## 2019-01-26 NOTE — Telephone Encounter (Signed)
rx faxed to [facility] pharmacy [phone], confirmation received

## 2019-01-26 NOTE — Addendum Note (Signed)
Addended by: Valeda Malm on: 01/26/2019 11:54 AM   Modules accepted: Orders

## 2019-01-26 NOTE — Telephone Encounter (Signed)
Called and made wife aware of recommendations to take ibuprofen 600 mg TID x 3 days. She verbalized understanding and thanked me for the call.

## 2019-01-26 NOTE — Telephone Encounter (Signed)
Can take ibuprofen 600 mg TID for the next 3 days.

## 2019-01-26 NOTE — Telephone Encounter (Signed)
Rx for tramadol ordered for patient per Dr Aundra Dubin. Will be faxed once signed by MD.

## 2019-01-26 NOTE — Telephone Encounter (Signed)
Patient had an ablation yesterday with Dr. Curt Bears. His wife Zackary Mckeone is calling, she states that patient is in quite a bit of pain. He had a very restless night last night. Sheryle Hail states that throat is extremely sore from where they removed the tube. They tylenol and throat spray is not helping. Patient was able to eat some soup last night but it was very painful. They would like something called in for pain. Kessa's call back number is [phone].

## 2019-01-26 NOTE — Telephone Encounter (Signed)
Returned call to patient's wife. She states that the patient's throat is very sore. The patient had an ablation with Dr. Curt Bears yesterday and was intubated. She states that he has been taking tylenol and using chloraseptic throat spray and it has not helped. She states that he was able to eat soup last night but it was painful. She is requesting something for pain. Patient is not having any other complaints at this time. Instructed for patient to continue with tylenol and the throat spray. Explained that his throat could be sore for a few days. Instructed for her to reach out to PCP if it does not improve in a few days or gets worse. She verbalized understanding and thanked me for the call.

## 2019-02-05 ENCOUNTER — Other Ambulatory Visit (HOSPITAL_COMMUNITY): Payer: Self-pay

## 2019-02-05 ENCOUNTER — Other Ambulatory Visit: Payer: Self-pay | Admitting: Cardiology

## 2019-02-05 NOTE — Telephone Encounter (Signed)
error 

## 2019-02-09 ENCOUNTER — Telehealth (HOSPITAL_COMMUNITY): Payer: Self-pay | Admitting: *Deleted

## 2019-02-09 NOTE — Telephone Encounter (Signed)
Records faxed to Dr.Robbie Williams at [phone] and Dr.Angel Leon [phone] per Dr.McLean

## 2019-02-20 ENCOUNTER — Telehealth (INDEPENDENT_AMBULATORY_CARE_PROVIDER_SITE_OTHER): Payer: Managed Care, Other (non HMO) | Admitting: Cardiology

## 2019-02-20 ENCOUNTER — Other Ambulatory Visit: Payer: Self-pay

## 2019-02-20 DIAGNOSIS — I493 Ventricular premature depolarization: Secondary | ICD-10-CM

## 2019-02-20 NOTE — Progress Notes (Signed)
Electrophysiology TeleHealth Note   Due to national recommendations of social distancing due to COVID 19, an audio/video telehealth visit is felt to be most appropriate for this patient at this time.  See Epic message for the patient's consent to telehealth for [facility].   Date:  02/20/2019   ID:  Lee Moore, DOB May 22, 1975, MRN [id number]  Location: patient's home  Provider location: [address] [place]  Evaluation Performed: Follow-up visit  PCP:  Patient, No Pcp Per  Cardiologist:  Shirlee Latch Electrophysiologist:  Dr Elberta Fortis  Chief Complaint:  PVC  History of Present Illness:    Lee Moore is a 43 y.o. male who presents via audio/video conferencing for a telehealth visit today.  Since last being seen in our clinic, the patient reports doing very well.  Today, he denies symptoms of palpitations, chest pain, shortness of breath,  lower extremity edema, dizziness, presyncope, or syncope.  The patient is otherwise without complaint today.  The patient denies symptoms of fevers, chills, cough, or new SOB worrisome for COVID 19.  He is status post PVC ablation 01/25/2019.  Unfortunately his repeat ECG shows continued PVCs.  Today, denies symptoms of palpitations, chest pain, shortness of breath, orthopnea, PND, lower extremity edema, claudication, dizziness, presyncope, syncope, bleeding, or neurologic sequela. The patient is tolerating medications without difficulties.  His ablation, he has felt improved.  He has not noticed palpitations nearly as often.  He currently has no chest pain or shortness of breath.  Past Medical History:  Diagnosis Date  . Coronary artery disease   . Essential hypertension   . History of cardiac arrest 07/30/2017   VT   . MI (myocardial infarction) [facility])     Past Surgical History:  Procedure Laterality Date  . CORONARY/GRAFT ACUTE MI REVASCULARIZATION N/A 07/30/2017   Procedure: Coronary/Graft Acute MI Revascularization;   Surgeon: Rinaldo Cloud, MD;  Location: [facility] INVASIVE CV LAB;  Service: Cardiovascular;  Laterality: N/A;  . LAPAROSCOPIC APPENDECTOMY N/A 02/19/2018   Procedure: APPENDECTOMY LAPAROSCOPIC;  Surgeon: Emelia Loron, MD;  Location: [facility] OR;  Service: General;  Laterality: N/A;  . LEFT HEART CATH AND CORONARY ANGIOGRAPHY N/A 07/30/2017   Procedure: LEFT HEART CATH AND CORONARY ANGIOGRAPHY;  Surgeon: Rinaldo Cloud, MD;  Location: [facility] INVASIVE CV LAB;  Service: Cardiovascular;  Laterality: N/A;  . PVC ABLATION N/A 02/10/2018   Procedure: PVC ABLATION;  Surgeon: Regan Lemming, MD;  Location: [facility] INVASIVE CV LAB;  Service: Cardiovascular;  Laterality: N/A;  . PVC ABLATION N/A 01/25/2019   Procedure: PVC ABLATION;  Surgeon: Regan Lemming, MD;  Location: [facility] INVASIVE CV LAB;  Service: Cardiovascular;  Laterality: N/A;    Current Outpatient Medications  Medication Sig Dispense Refill  . allopurinol (ZYLOPRIM) 100 MG tablet Take 100 mg by mouth daily as needed (gout).     Marland Kitchen aspirin EC 81 MG tablet Take 81 mg by mouth daily.    Marland Kitchen atorvastatin (LIPITOR) 80 MG tablet Take 80 mg by mouth daily.    . carvedilol (COREG) 12.5 MG tablet Take 1.5 tablets (18.75 mg total) by mouth 2 (two) times daily with a meal. 268 tablet 3  . clopidogrel (PLAVIX) 75 MG tablet Take 1 tablet (75 mg total) by mouth daily. 90 tablet 3  . mexiletine (MEXITIL) 250 MG capsule TAKE ONE CAPSULE BY MOUTH TWICE DAILY 60 capsule 6  . OVER THE COUNTER MEDICATION Take 1 capsule by mouth daily. Sea moss otc supplement    . sacubitril-valsartan (ENTRESTO)  49-51 MG Take 1 tablet by mouth 2 (two) times daily.    Marland Kitchen spironolactone (ALDACTONE) 25 MG tablet Take 1 tablet (25 mg total) by mouth daily. 90 tablet 3  . traMADol (ULTRAM) 50 MG tablet Take 1 tablet (50 mg total) by mouth 2 (two) times daily as needed. 14 tablet 0   No current facility-administered medications for this visit.     Allergies:   Patient has no known allergies.    Social History:  The patient  reports that he has never smoked. He has never used smokeless tobacco. He reports previous alcohol use. He reports that he does not use drugs.   Family History:  The patient's  family history includes Arthritis in his mother; Asthma in his daughter; Hypertension in his mother.   ROS:  Please see the history of present illness.   All other systems are personally reviewed and negative.    Exam:    Vital Signs:  There were no vitals taken for this visit.  no acute distress, no shortness of breath.  Labs/Other Tests and Data Reviewed:    Recent Labs: 01/24/2019: BUN 13; Creatinine, Ser 1.02; Hemoglobin 15.0; Platelets 285; Potassium 4.3; Sodium 136   Wt Readings from Last 3 Encounters:  01/25/19 209 lb (94.8 kg)  01/24/19 212 lb (96.2 kg)  01/24/19 205 lb (93 kg)     Other studies personally reviewed: Additional studies/ records that were reviewed today include: ECG 02/13/2019 personally reviewed Review of the above records today demonstrates: Sinus rhythm, PVCs   ASSESSMENT & PLAN:    1.  PVCs: Having a high burden.  He has had 2 ablations, but continues to have PVCs.  At this point, I feel that he needs to be evaluated closer to home in [place].  We Neithan Day look into clinics at [facility], as he may wish to change providers.  That being said, he is not completely ready to switch.  We Coden Franchi stop his mexiletine  2.  Coronary artery disease: Status post RCA stent.  Currently on aspirin and Plavix.  3.  Ischemic cardiomyopathy: Ejection fraction 45 to 50% currently on optimal medical therapy.   COVID 19 screen The patient denies symptoms of COVID 19 at this time.  The importance of social distancing was discussed today.  Follow-up: 6 months  Current medicines are reviewed at length with the patient today.   The patient does not have concerns regarding his medicines.  The following changes were made today: Stop mexiletine  Labs/ tests ordered today  include:  No orders of the defined types were placed in this encounter.    Patient Risk:  after full review of this patients clinical status, I feel that they are at moderate risk at this time.  Today, I have spent 8 minutes with the patient with telehealth technology discussing PVCs.    Signed, Olliver Boyadjian Meredith Leeds, MD  02/20/2019 7:46 AM     [facility] [address] [place] [address] [phone] (office) [phone] (fax)

## 2019-03-13 ENCOUNTER — Other Ambulatory Visit: Payer: Self-pay

## 2019-03-13 ENCOUNTER — Ambulatory Visit (HOSPITAL_COMMUNITY)
Admission: RE | Admit: 2019-03-13 | Discharge: 2019-03-13 | Disposition: A | Discharge status: Home / Self Care | Payer: Managed Care, Other (non HMO) | Source: Ambulatory Visit | Attending: Cardiology | Admitting: Cardiology

## 2019-03-13 DIAGNOSIS — Z955 Presence of coronary angioplasty implant and graft: Secondary | ICD-10-CM

## 2019-03-13 DIAGNOSIS — I2510 Atherosclerotic heart disease of native coronary artery without angina pectoris: Secondary | ICD-10-CM | POA: Diagnosis not present

## 2019-03-13 DIAGNOSIS — I5022 Chronic systolic (congestive) heart failure: Secondary | ICD-10-CM | POA: Diagnosis not present

## 2019-03-13 DIAGNOSIS — I252 Old myocardial infarction: Secondary | ICD-10-CM | POA: Diagnosis not present

## 2019-03-13 DIAGNOSIS — I472 Ventricular tachycardia: Secondary | ICD-10-CM

## 2019-03-13 DIAGNOSIS — M109 Gout, unspecified: Secondary | ICD-10-CM

## 2019-03-13 DIAGNOSIS — N182 Chronic kidney disease, stage 2 (mild): Secondary | ICD-10-CM

## 2019-03-13 DIAGNOSIS — Z9861 Coronary angioplasty status: Secondary | ICD-10-CM

## 2019-03-13 DIAGNOSIS — I5043 Acute on chronic combined systolic (congestive) and diastolic (congestive) heart failure: Secondary | ICD-10-CM

## 2019-03-13 DIAGNOSIS — Z9089 Acquired absence of other organs: Secondary | ICD-10-CM

## 2019-03-13 DIAGNOSIS — I493 Ventricular premature depolarization: Secondary | ICD-10-CM

## 2019-03-13 NOTE — Progress Notes (Signed)
Heart Failure TeleHealth Note  Due to national recommendations of social distancing due to [place] 19, Audio/video telehealth visit is felt to be most appropriate for this patient at this time.  See MyChart message from today for patient consent regarding telehealth for [facility].  Date:  03/13/2019   ID:  Lee Moore, DOB Feb 27, 1976, MRN [id number]  Location: Home  Provider location: [place] [facility] Type of Visit: Established patient   PCP:  Patient, No Pcp Per  Cardiologist:  Dr. Aundra Dubin Chief Complaint: Shortness of breath.    History of Present Illness: Lee Moore is a 43 y.o. male who presents via audio/video conferencing for a telehealth visit today.     he denies symptoms worrisome for COVID 19.   Pateint has a history of CAD s/p MI in 2017 and again in 5/19.  Patient had initial MI in [place] in 2017, he reports a total of 5 stents to the RCA system. He recently moved to [place] with work.  In 5/19, he had inferior STEMI complicated by multiple episodes of pulseless VT.  He had CPR and ultimately when to the cath lab.  This showed occluded proximal RCA but minimal disease on the left.  The proximal to mid RCA were stented, restoring flow to an acute marginal.  However, the distal RCA, PLV and PDA remained occluded. There were collaterals to the PLV from the left suggesting pre-existing disease.  He underwent hypothermia protocol and had full neurologic recovery from arrest.  Echo in 5/19 showed EF 30-35%.  Cardiac medications were titrated up and he was discharged with a Lifevest.    Repeat echo in 7/19 showed EF improved to 50-55%.    In 8/19, he was admitted with acute appendicitis.  He was managed medically with antibiotics.  Pain resolved, and it was decided not to operate on him immediately due to recent PCI.  He has not had any further appendicitis-type pain.  However,after this, he developed profuse diarrhea and was diagnosed with C difficile  colitis.  He was treated with po vancomycin with resolution.     At prior appointment, he was noted to have frequent PVCs. Zio patch showed 36% PVCs.  Echo was done in 10/19, showing EF 45-50%, inferior hypokinesis.  Lexiscan Cardiolite was done, showing fixed inferior defect, no ischemia.  I started him on amiodarone and sent him to see EP, who talked with him about option of PVC ablation. He had PVC ablation in 11/19. He is now off amiodarone.   Also in 11/19, he had recurrent appendicitis with laparoscopic appendectomy.   He continued to feel PVCs.  Repeat Zio patch in 2/20 actually showed 42% PVCs.  Mexiletine was started.   Zio patch in 5/20 showed 37% PVCs and mexiletine was increased to 250 mg bid.  Echo was done in 8/20 in [place], showing EF 35-40% with moderate diffuse hypokinesis, mildly dilated RV with mildly decreased function.    Echo in 10/20 showed EF 35% with mild LV dilation, inferior/inferoseptal akinesis, mildly decreased RV systolic function. Cardiolite in 10/20 showed prior inferior infarct with no ischemia. He had repeat PVC ablation in 10/20.   Patient says that he is feeling good.  Not as many flutters noted after 2nd PVC ablation, thinks he is doing better.  Good energy level.  No chest pain.  No significant dyspnea walking on flat ground or up a flight of stairs. He cut Coreg back to 12.5 mg bid as he was getting  lightheaded at higher dose.     Labs (5/19): K 4.2 => 4.8, creatinine 1.54 => 1.38, hgb 11.7, AST 97, ALT 151 Labs (6/19): K 4.3, creatinine 1.16 => 1.08, LFTs normal Labs (8/19): LDL 29, HDL 51, TGs 53 Labs (9/19): K 3.7, creatinine 1.12 Labs (10/19): Mg 1.9, K 4.5, creatinine 1.16, uric acid 9 Labs (11/19): K 3.7, creatinine 1.04 Labs (12/19): K 4.5, creatinine 1.0  Labs (10/20): K 4.3, creatinine 1.02, LDL 45  PMH: 1. CAD: MI in 2017, per report patient had a total of 5 stents in the RCA system.  - Inferior STEMI 5/19: LHC showed occluded RCA with  relatively clear left coronary system. DES to proximal-mid RCA restoring flow to an acute marginal, however, the distal RCA/PLV/PDA remained occluded.  There were collaterals to the PLV from the left.   - Lexiscan Cardiolite (10/19): Fixed inferior defect, no ischemia.  - Lexiscan Cardiolite (10/20): Fixed inferior defect, no ischemia.  2. VT: Patient had pulseless VT in setting of inferior STEMI.   3. Chronic systolic CHF: Ischemic cardiomyopathy.   - Echo (5/19): EF 30-35%, inferior WMA.   - Echo (7/19): EF 50-55% - Echo (10/19): EF 45-50%, mild LVH, inferior hypokinesis, mild MR, normal RV size and systolic function.  - Echo (8/20): EF 35-40%, diffuse hypokinesis, mildly dilated RV with mildly decreased systolic function, mild MR.  - Echo (10/20): EF 35% with mild LV dilation, inferior/inferoseptal akinesis, mildly decreased RV systolic function. 4. CKD: Stage II.  5. Acute appendicitis: Managed medically in 8/19.  - Laparoscopic appendectomy in 11/19.  6. C difficile diarrhea 8/19-9/19 7. Gout 8. PVCs: Zio Patch in 10/19 with 36% PVCs.  - PVC ablation in 11/19.  - Zio patch (2/20): 42% PVCs => started mexiletine.  - Zio patch (5/20): 37% PVCs => mexiletine increased.  - PVC ablation 10/20.   Current Outpatient Medications  Medication Sig Dispense Refill  . carvedilol (COREG) 12.5 MG tablet Take 12.5 mg by mouth 2 (two) times daily with a meal.    . allopurinol (ZYLOPRIM) 100 MG tablet Take 100 mg by mouth daily as needed (gout).     Marland Kitchen aspirin EC 81 MG tablet Take 81 mg by mouth daily.    Marland Kitchen atorvastatin (LIPITOR) 80 MG tablet Take 80 mg by mouth daily.    . clopidogrel (PLAVIX) 75 MG tablet Take 1 tablet (75 mg total) by mouth daily. 90 tablet 3  . mexiletine (MEXITIL) 250 MG capsule TAKE ONE CAPSULE BY MOUTH TWICE DAILY 60 capsule 6  . OVER THE COUNTER MEDICATION Take 1 capsule by mouth daily. Sea moss otc supplement    . sacubitril-valsartan (ENTRESTO) 49-51 MG Take 1 tablet by  mouth 2 (two) times daily.    Marland Kitchen spironolactone (ALDACTONE) 25 MG tablet Take 1 tablet (25 mg total) by mouth daily. 90 tablet 3  . traMADol (ULTRAM) 50 MG tablet Take 1 tablet (50 mg total) by mouth 2 (two) times daily as needed. 14 tablet 0   No current facility-administered medications for this encounter.    Allergies:   Patient has no known allergies.   Social History:  The patient  reports that he has never smoked. He has never used smokeless tobacco. He reports previous alcohol use. He reports that he does not use drugs.   Family History:  The patient's family history includes Arthritis in his mother; Asthma in his daughter; Hypertension in his mother.   ROS:  Please see the history of present illness.  All other systems are personally reviewed and negative.   Exam:    (Video/Tele Health Call; Exam is subjective and or/visual.) General:  Speaks in full sentences. No resp difficulty. Lungs: Normal respiratory effort with conversation.  Abdomen: Non-distended per patient report Extremities: Pt denies edema. Neuro: Alert & oriented x 3.   Recent Labs: 01/24/2019: BUN 13; Creatinine, Ser 1.02; Hemoglobin 15.0; Platelets 285; Potassium 4.3; Sodium 136  Personally reviewed   Wt Readings from Last 3 Encounters:  01/25/19 94.8 kg (209 lb)  01/24/19 96.2 kg (212 lb)  01/24/19 93 kg (205 lb)     ASSESSMENT AND PLAN:  1. CAD: Initial MI in 2017, 5 stents to RCA system.  Inferior MI in 5/19, DES to proximal/mid RCA but distal RCA and PLV/PDA remained occluded.  No good options for opening. Left coronary system had minimal disease.  Lexiscan Cardiolite in 10/20 with inferior scar, no ischemia.  No recent chest pain.  - Continue ASA 81 daily.  - He will continue Plavix 75 mg daily.   - Continue atorvastatin 80 mg daily, good lipids in 10/20.     2. Chronic systolic CHF: Ischemic cardiomyopathy.  NYHA class II symptoms, improved.  Echo in 10/19 showed EF 45-50% but echo in 10/20 showed  EF 35% with basal to mid inferior and inferoseptal akinesis, the remainder of the LV appeared mildly hypokinetic.  I am concerned that frequent PVCs may have led to worsening of LV function.  - Continue Entresto 49/51 bid.   - Continue Coreg 12.5 mg bid (did not tolerate increase to 18.75 mg bid due to orthostatic symptoms).   - Continue spironolactone 25 mg daily.  - He has not needed Lasix. - He will need repeat echo in 3 months or so post-PVC ablation in 10/20, will try to arrange this in [place].   - I will arrange for a BMET.  3. VT: Pulseless VT with CPR peri-MI.  He underwent hypothermia protocol.  EF recovered to 45-50%, but is now down to 35%.  He has had episodes of sudden lightheadedness without passing out that is not positional, ?short runs VT.  4. 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  Echo did not show significant change in LV systolic function and Cardiolite did not show ischemia.  He has had PVC ablation and is now off amiodarone.  Repeat Zio patch in 2/20 showed 42% PVCs.  Mexiletine was started.  Zio patch in 5/20 showed 37% PVCs, mexiletine was increased to 250 mg bid.  Repeat PVC ablation in 10/20, he felt less palpitations after this and Dr. Elberta Fortisamnitz has stopped his mexiletine.  - I will arrange for repeat Zio patch monitor x 3 days (will send to him in [place]).   - Will need to repeat echo several months after ablation to see if EF has improved.   COVID screen The patient does not have any symptoms that suggest any further testing/ screening at this time.  Social distancing reinforced today.  Patient Risk: After full review of this patients clinical status, I feel that they are at moderate risk for cardiac decompensation at this time.  Relevant cardiac medications were reviewed at length with the patient today. The patient does not have concerns regarding their medications at this time.   Recommended follow-up:  2 months virtually (or can transition [facility] to [facility]).   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   Today, I have spent 18 minutes with the patient with telehealth technology discussing  the above issues .    Signed, Marca Ancona, MD  03/13/2019  [facility] [place] [address] [facility] [place] [address] [phone] (office) [phone] (fax)

## 2019-03-13 NOTE — Patient Instructions (Signed)
No medication changes.   Labs locally. Please fax results to [phone] We will only contact you if something comes back abnormal or we need to make some changes. Otherwise no news is good news!  Your provider has recommended that  you wear a Zio Patch for 3 days.  This monitor will record your heart rhythm for our review.  IF you have any symptoms while wearing the monitor please press the button.  If you have any issues with the patch or you notice a red or orange light on it please call the company at [phone].  Once you remove the patch please mail it back to the company as soon as possible so we can get the results.  This zio patch  Your physician recommends that you schedule a follow-up appointment in: 2 months with virtual visit.   At the [facility], you and your health needs are our priority. As part of our continuing mission to provide you with exceptional heart care, we have created designated Provider Care Teams. These Care Teams include your primary Cardiologist (physician) and Advanced Practice Providers (APPs- Physician Assistants and Nurse Practitioners) who all work together to provide you with the care you need, when you need it.   You may see any of the following providers on your designated Care Team at your next follow up: Marland Kitchen Dr Glori Bickers . Dr Loralie Champagne . Darrick Grinder, NP . Lyda Jester, PA . Audry Riles, PharmD   Please be sure to bring in all your medications bottles to every appointment.

## 2019-03-13 NOTE — Progress Notes (Signed)
Called patient to review AVS.  Patient registered on ziosuites.  To be sent directly to patients home. LM for patient to return call to discuss

## 2019-03-15 ENCOUNTER — Telehealth (HOSPITAL_COMMUNITY): Payer: Self-pay

## 2019-03-15 NOTE — Telephone Encounter (Signed)
Lmtrc, patient needs a 10 day bmet per DM and a 2 month fu virtual visit

## 2019-03-19 ENCOUNTER — Other Ambulatory Visit (HOSPITAL_COMMUNITY): Payer: Self-pay

## 2019-03-19 ENCOUNTER — Telehealth (HOSPITAL_COMMUNITY): Payer: Self-pay

## 2019-03-19 MED ORDER — ENTRESTO 49-51 MG PO TABS: 1.0000 | ORAL_TABLET | Freq: Two times a day (BID) | ORAL | 3 refills | Status: DC

## 2019-03-19 NOTE — Telephone Encounter (Signed)
-----   Message from Valeda Malm, RN sent at 03/15/2019  4:20 PM EST ----- Please follow up with him. He needs bmet soon and 2 month virtual

## 2019-05-16 ENCOUNTER — Other Ambulatory Visit: Payer: Self-pay | Admitting: Student

## 2019-05-18 ENCOUNTER — Ambulatory Visit (HOSPITAL_COMMUNITY)
Admission: RE | Admit: 2019-05-18 | Discharge: 2019-05-18 | Disposition: A | Discharge status: Home / Self Care | Payer: Managed Care, Other (non HMO) | Source: Ambulatory Visit | Attending: Cardiology | Admitting: Cardiology

## 2019-05-18 ENCOUNTER — Other Ambulatory Visit: Payer: Self-pay

## 2019-05-18 NOTE — Progress Notes (Addendum)
Called patient as MD was unable to contact patient for his virtual.  No answe, unable to leave a message.

## 2019-05-30 ENCOUNTER — Other Ambulatory Visit (HOSPITAL_COMMUNITY): Payer: Self-pay

## 2019-05-30 MED ORDER — CLOPIDOGREL BISULFATE 75 MG PO TABS: 75.0000 mg | ORAL_TABLET | Freq: Every day | ORAL | 3 refills | Status: DC

## 2020-03-18 ENCOUNTER — Other Ambulatory Visit (HOSPITAL_COMMUNITY): Payer: Self-pay | Admitting: Cardiology

## 2020-06-20 ENCOUNTER — Other Ambulatory Visit (HOSPITAL_COMMUNITY): Payer: Self-pay | Admitting: Cardiology

## 2020-08-05 IMAGING — CT CT ABD-PELV W/ CM
2 of 5 series · 16 of 46 positions shown, 18 images · IV contrast (APPLIED)
Comparison: None.

CLINICAL DATA: Right upper quadrant and epigastric pain today.
Nausea and vomiting. Hematuria.

EXAM:
CT ABDOMEN AND PELVIS WITH CONTRAST
TECHNIQUE: Multidetector CT imaging of the abdomen and pelvis was performed
using the standard protocol following bolus administration of
intravenous contrast.
CONTRAST:  100mL IRVYWJ-ELL IOPAMIDOL (IRVYWJ-ELL) INJECTION 61%

[Series 2: axial st · axial · 0.97mm/px · z∈[+425,+895]mm · 13 of 106 slices shown, 15 images]
[im 6/106  soft-tissue]
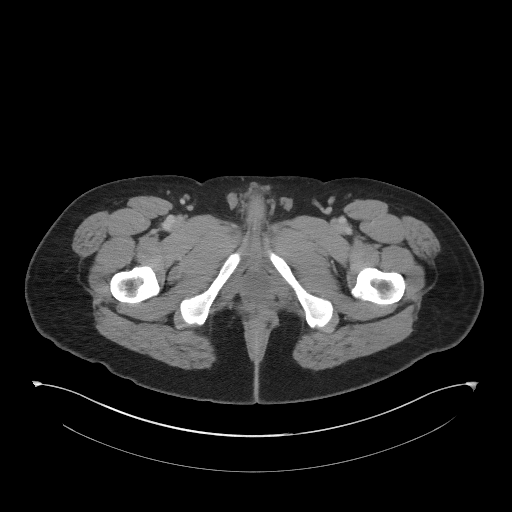
[im 6/106  bone]
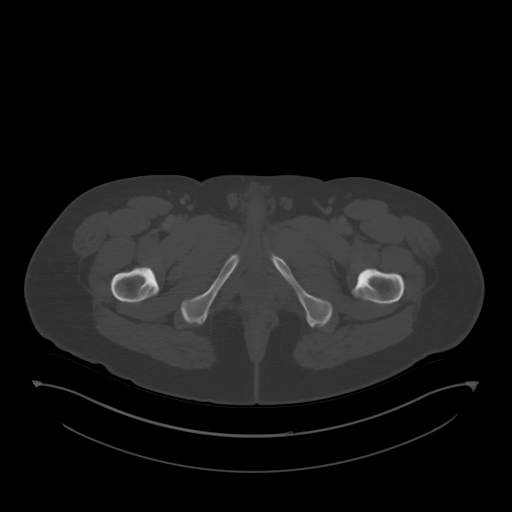
[im 17/106  soft-tissue]
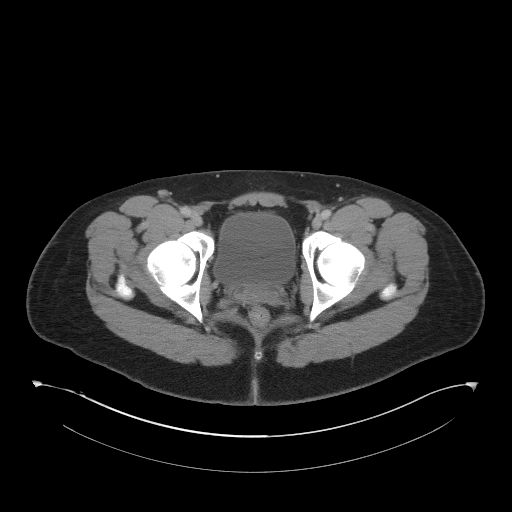
[im 23/106  soft-tissue]
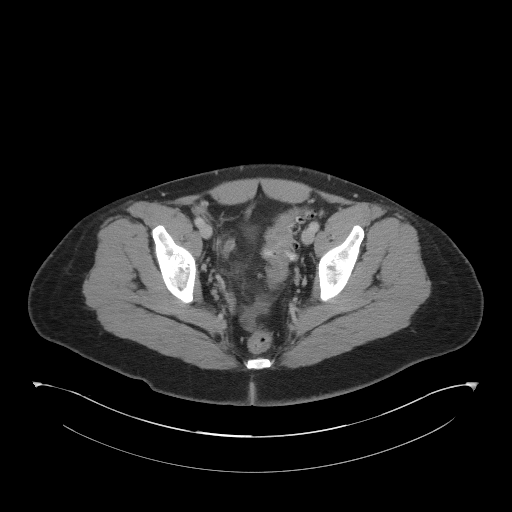
[im 28/106  soft-tissue]
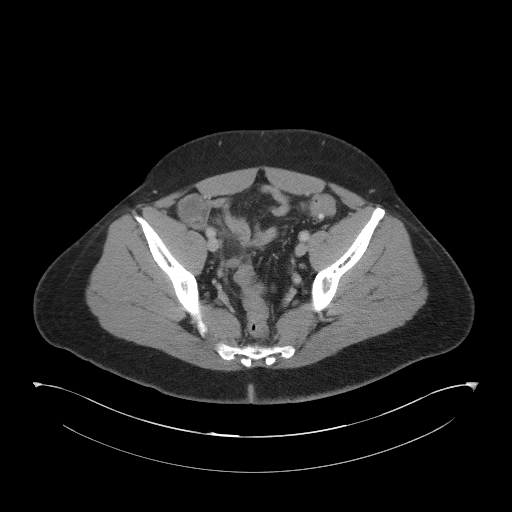
[im 39/106  soft-tissue]
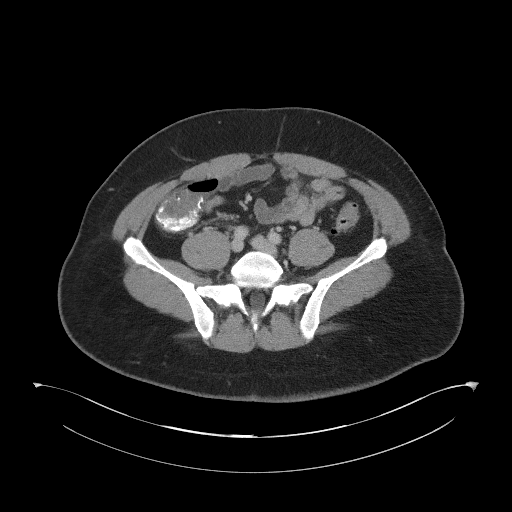
[im 45/106  soft-tissue]
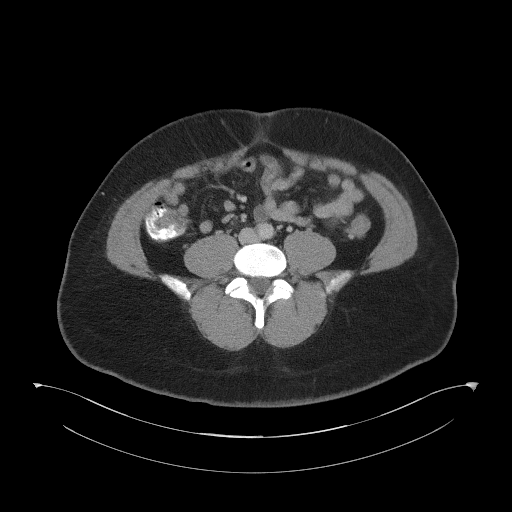
[im 56/106  soft-tissue]
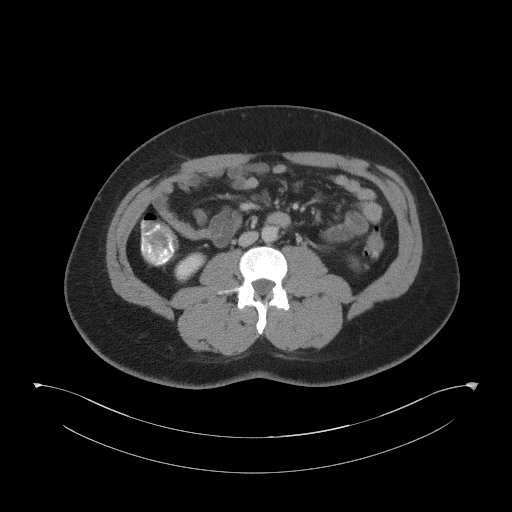
[im 61/106  soft-tissue]
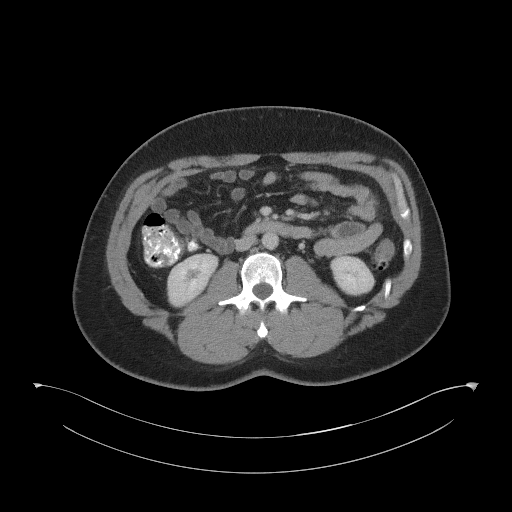
[im 67/106  soft-tissue]
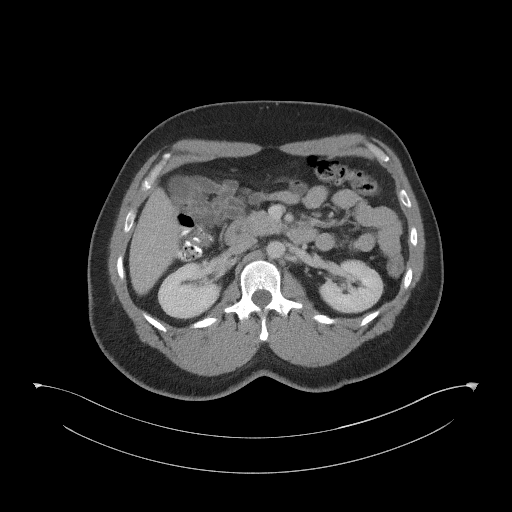
[im 67/106  bone]
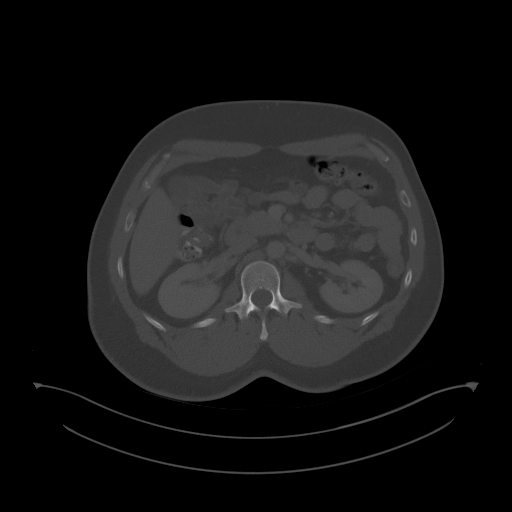
[im 78/106  soft-tissue]
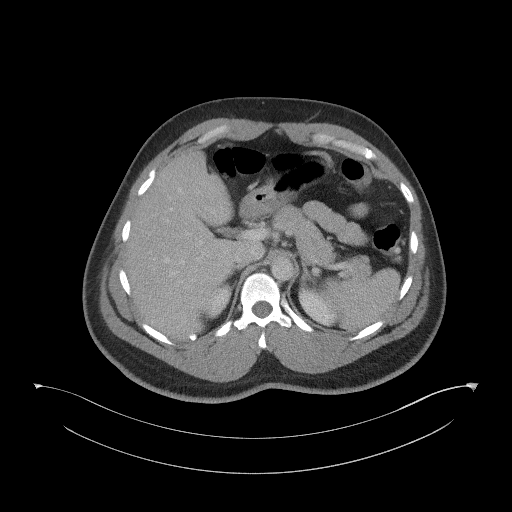
[im 83/106  soft-tissue]
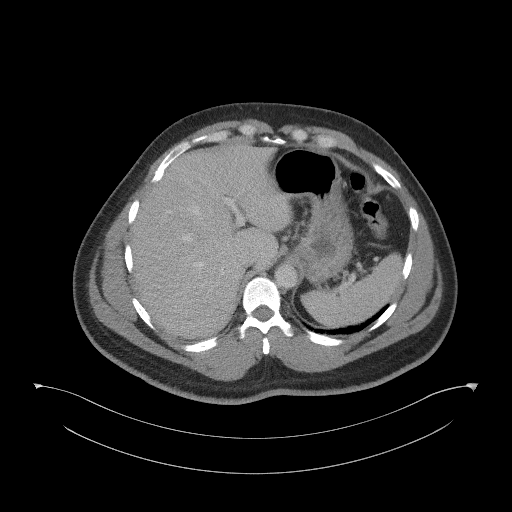
[im 89/106  soft-tissue]
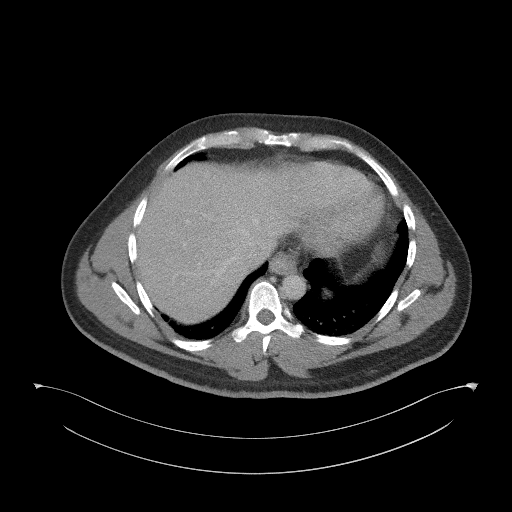
[im 100/106  soft-tissue]
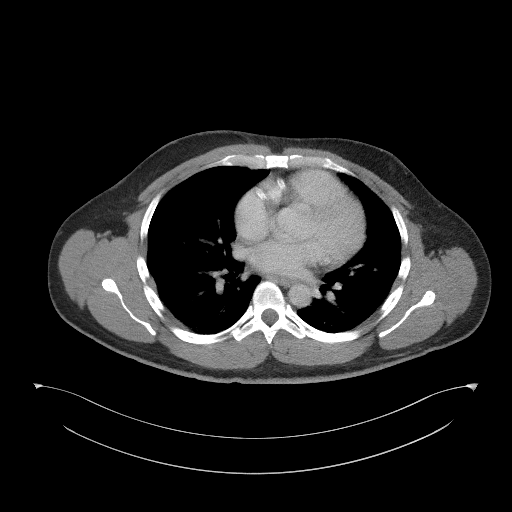

[Series 5: coronal st · coronal · 0.87mm/px · 3 of 90 slices shown]
[im 30/90  soft-tissue]
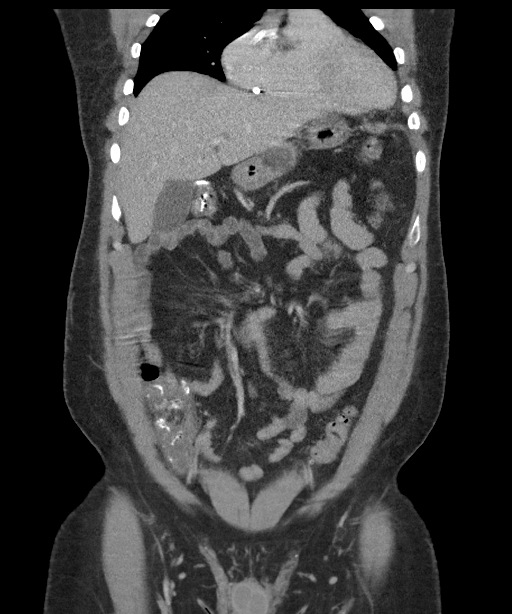
[im 40/90  soft-tissue]
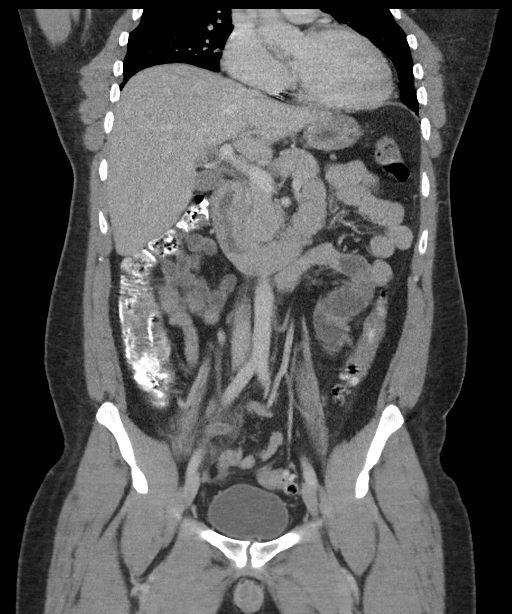
[im 50/90  soft-tissue]
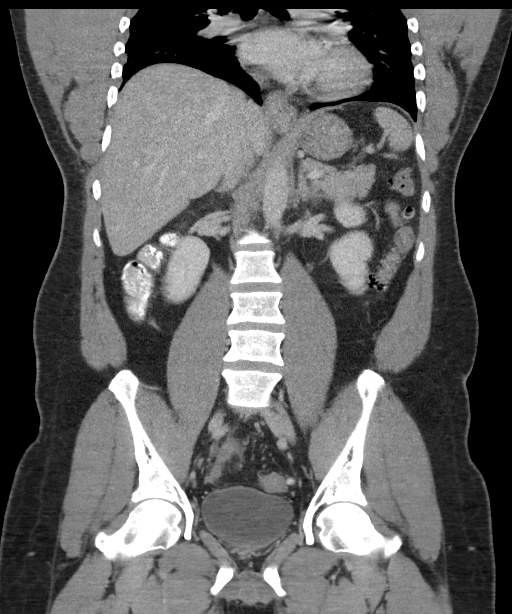

[16 of 46 positions shown; findings below may reference images not displayed]

FINDINGS: Lower chest: Dependent atelectasis in the lung bases. Coronary
artery calcifications. Possible coronary stents.

Hepatobiliary: No focal liver abnormality is seen. No gallstones,
gallbladder wall thickening, or biliary dilatation.

Pancreas: Unremarkable. No pancreatic ductal dilatation or
surrounding inflammatory changes.

Spleen: Normal in size without focal abnormality.

Adrenals/Urinary Tract: Adrenal glands are unremarkable. Kidneys are
normal, without renal calculi, focal lesion, or hydronephrosis.
Bladder is unremarkable.

Stomach/Bowel: Distended appendix with diameter measuring 12 mm.
Periappendiceal stranding and periappendiceal fluid. Small amount of
free fluid in the pelvis. Changes consistent with acute
appendicitis.

Appendix: Location: Medial and inferior to the cecum, extending
along the psoas margin to the right pelvis

Diameter: 12 mm

Appendicolith: No

Mucosal hyper-enhancement: Mild

Extraluminal gas: No

Periappendiceal collection: No discrete collection. Periappendiceal
stranding and edema demonstrated.

Stomach, small bowel, and colon are not abnormally distended. No
wall thickening is appreciated. Sigmoid colonic diverticulosis
without evidence of diverticulitis.

Vascular/Lymphatic: No significant vascular findings are present. No
enlarged abdominal or pelvic lymph nodes.

Reproductive: Prostate is unremarkable.

Other: No free air in the abdomen. Small periumbilical hernias
containing fat.

Musculoskeletal: No acute or significant osseous findings.
IMPRESSION: 1. Acute appendicitis. No abscess. Periappendiceal stranding and
fluid.
2. Small amount of free fluid in the pelvis, likely reactive.
3. Sigmoid colonic diverticulosis without evidence of
diverticulitis.

## 2020-09-09 ENCOUNTER — Other Ambulatory Visit (HOSPITAL_COMMUNITY): Payer: Self-pay | Admitting: *Deleted

## 2020-09-09 DIAGNOSIS — Z9861 Coronary angioplasty status: Secondary | ICD-10-CM

## 2020-09-09 DIAGNOSIS — I5043 Acute on chronic combined systolic (congestive) and diastolic (congestive) heart failure: Secondary | ICD-10-CM

## 2020-09-09 MED ORDER — CLOPIDOGREL BISULFATE 75 MG PO TABS: 75.0000 mg | ORAL_TABLET | Freq: Every day | ORAL | 3 refills | Status: AC
